# Patient Record
Sex: Male | Born: 1955
Health system: Southern US, Community
[De-identification: ages and names within clinical notes are randomized; demographics above are authoritative.]

## PROBLEM LIST (undated history)

## (undated) DIAGNOSIS — I10 Essential (primary) hypertension: Secondary | ICD-10-CM

## (undated) DIAGNOSIS — E781 Pure hyperglyceridemia: Secondary | ICD-10-CM

## (undated) DIAGNOSIS — F209 Schizophrenia, unspecified: Secondary | ICD-10-CM

## (undated) DIAGNOSIS — K219 Gastro-esophageal reflux disease without esophagitis: Secondary | ICD-10-CM

## (undated) DIAGNOSIS — J439 Emphysema, unspecified: Secondary | ICD-10-CM

## (undated) HISTORY — DX: Essential (primary) hypertension: I10

## (undated) HISTORY — DX: Schizophrenia, unspecified: F20.9

## (undated) HISTORY — PX: TONSILLECTOMY: SUR1361

## (undated) HISTORY — DX: Gastro-esophageal reflux disease without esophagitis: K21.9

## (undated) HISTORY — DX: Emphysema, unspecified: J43.9

## (undated) HISTORY — PX: WISDOM TOOTH EXTRACTION: SHX21

---

## 2001-04-03 ENCOUNTER — Encounter (INDEPENDENT_AMBULATORY_CARE_PROVIDER_SITE_OTHER): Payer: Self-pay | Admitting: Specialist

## 2001-04-03 ENCOUNTER — Other Ambulatory Visit: Admission: RE | Admit: 2001-04-03 | Discharge: 2001-04-03 | Payer: Self-pay | Admitting: Cardiology

## 2004-04-28 ENCOUNTER — Encounter (INDEPENDENT_AMBULATORY_CARE_PROVIDER_SITE_OTHER): Payer: Self-pay | Admitting: Family Medicine

## 2004-04-28 LAB — CONVERTED CEMR LAB: PSA: 0.29 ng/mL

## 2004-07-05 ENCOUNTER — Ambulatory Visit: Payer: Self-pay | Admitting: Internal Medicine

## 2004-08-04 ENCOUNTER — Ambulatory Visit: Payer: Self-pay | Admitting: *Deleted

## 2004-12-25 ENCOUNTER — Ambulatory Visit: Payer: Self-pay | Admitting: Family Medicine

## 2005-02-05 ENCOUNTER — Ambulatory Visit: Payer: Self-pay | Admitting: Family Medicine

## 2005-04-18 ENCOUNTER — Ambulatory Visit: Payer: Self-pay | Admitting: Family Medicine

## 2005-06-18 ENCOUNTER — Ambulatory Visit: Payer: Self-pay | Admitting: Family Medicine

## 2005-10-15 ENCOUNTER — Ambulatory Visit: Payer: Self-pay | Admitting: Family Medicine

## 2006-02-19 ENCOUNTER — Ambulatory Visit: Payer: Self-pay | Admitting: Family Medicine

## 2006-06-05 ENCOUNTER — Ambulatory Visit: Payer: Self-pay | Admitting: Family Medicine

## 2006-09-25 ENCOUNTER — Ambulatory Visit: Payer: Self-pay | Admitting: Family Medicine

## 2007-01-13 ENCOUNTER — Ambulatory Visit: Payer: Self-pay | Admitting: Family Medicine

## 2007-02-20 ENCOUNTER — Ambulatory Visit: Payer: Self-pay | Admitting: Family Medicine

## 2007-06-23 ENCOUNTER — Encounter (INDEPENDENT_AMBULATORY_CARE_PROVIDER_SITE_OTHER): Payer: Self-pay | Admitting: Family Medicine

## 2007-06-23 DIAGNOSIS — E785 Hyperlipidemia, unspecified: Secondary | ICD-10-CM | POA: Insufficient documentation

## 2007-06-23 DIAGNOSIS — F200 Paranoid schizophrenia: Secondary | ICD-10-CM | POA: Insufficient documentation

## 2007-06-23 DIAGNOSIS — Z87891 Personal history of nicotine dependence: Secondary | ICD-10-CM | POA: Insufficient documentation

## 2007-06-23 DIAGNOSIS — Z9089 Acquired absence of other organs: Secondary | ICD-10-CM | POA: Insufficient documentation

## 2007-06-23 DIAGNOSIS — K219 Gastro-esophageal reflux disease without esophagitis: Secondary | ICD-10-CM | POA: Insufficient documentation

## 2007-06-24 DIAGNOSIS — E781 Pure hyperglyceridemia: Secondary | ICD-10-CM | POA: Insufficient documentation

## 2008-01-23 ENCOUNTER — Ambulatory Visit: Payer: Self-pay | Admitting: Family Medicine

## 2008-01-23 DIAGNOSIS — I10 Essential (primary) hypertension: Secondary | ICD-10-CM | POA: Insufficient documentation

## 2008-01-30 ENCOUNTER — Encounter (INDEPENDENT_AMBULATORY_CARE_PROVIDER_SITE_OTHER): Payer: Self-pay | Admitting: Family Medicine

## 2008-01-30 LAB — CONVERTED CEMR LAB
ALT: 10 units/L (ref 0–53)
AST: 16 units/L (ref 0–37)
Albumin: 5 g/dL (ref 3.5–5.2)
Alkaline Phosphatase: 193 units/L — ABNORMAL HIGH (ref 39–117)
BUN: 11 mg/dL (ref 6–23)
Basophils Absolute: 0.1 10*3/uL (ref 0.0–0.1)
Basophils Relative: 1 % (ref 0–1)
CO2: 20 meq/L (ref 19–32)
Calcium: 9.8 mg/dL (ref 8.4–10.5)
Chloride: 100 meq/L (ref 96–112)
Cholesterol: 193 mg/dL (ref 0–200)
Creatinine, Ser: 0.95 mg/dL (ref 0.40–1.50)
Eosinophils Absolute: 0.5 10*3/uL (ref 0.0–0.7)
Eosinophils Relative: 8 % — ABNORMAL HIGH (ref 0–5)
Glucose, Bld: 71 mg/dL (ref 70–99)
HCT: 50.1 % (ref 39.0–52.0)
HDL: 46 mg/dL (ref >39)
Hemoglobin: 16.6 g/dL (ref 13.0–17.0)
LDL Cholesterol: 129 mg/dL — ABNORMAL HIGH (ref 0–99)
Lymphocytes Relative: 45 % (ref 12–46)
Lymphs Abs: 3.1 10*3/uL (ref 0.7–4.0)
MCHC: 33.1 g/dL (ref 30.0–36.0)
MCV: 95.6 fL (ref 78.0–100.0)
Monocytes Absolute: 0.5 10*3/uL (ref 0.1–1.0)
Monocytes Relative: 7 % (ref 3–12)
Neutro Abs: 2.8 10*3/uL (ref 1.7–7.7)
Neutrophils Relative %: 40 % — ABNORMAL LOW (ref 43–77)
Platelets: 333 10*3/uL (ref 150–400)
Potassium: 5 meq/L (ref 3.5–5.3)
RBC: 5.24 M/uL (ref 4.22–5.81)
RDW: 13.6 % (ref 11.5–15.5)
Sodium: 137 meq/L (ref 135–145)
TSH: 0.834 microintl units/mL (ref 0.350–5.50)
Total Bilirubin: 0.5 mg/dL (ref 0.3–1.2)
Total CHOL/HDL Ratio: 4.2
Total Protein: 8.1 g/dL (ref 6.0–8.3)
Triglycerides: 88 mg/dL (ref <150)
VLDL: 18 mg/dL (ref 0–40)
WBC: 7 10*3/uL (ref 4.0–10.5)

## 2008-09-29 ENCOUNTER — Telehealth (INDEPENDENT_AMBULATORY_CARE_PROVIDER_SITE_OTHER): Payer: Self-pay | Admitting: *Deleted

## 2008-12-13 ENCOUNTER — Ambulatory Visit: Payer: Self-pay | Admitting: Family Medicine

## 2008-12-13 LAB — CONVERTED CEMR LAB
ALT: <8 units/L (ref 0–53)
AST: 11 units/L (ref 0–37)
Albumin: 4.6 g/dL (ref 3.5–5.2)
Alkaline Phosphatase: 140 units/L — ABNORMAL HIGH (ref 39–117)
BUN: 13 mg/dL (ref 6–23)
Basophils Absolute: 0.1 10*3/uL (ref 0.0–0.1)
Basophils Relative: 1 % (ref 0–1)
CO2: 19 meq/L (ref 19–32)
Calcium: 9.8 mg/dL (ref 8.4–10.5)
Chloride: 105 meq/L (ref 96–112)
Cholesterol: 164 mg/dL (ref 0–200)
Creatinine, Ser: 1.29 mg/dL (ref 0.40–1.50)
Eosinophils Absolute: 0.7 10*3/uL (ref 0.0–0.7)
Eosinophils Relative: 8 % — ABNORMAL HIGH (ref 0–5)
Glucose, Bld: 91 mg/dL (ref 70–99)
HCT: 46.8 % (ref 39.0–52.0)
HDL: 40 mg/dL (ref >39)
Hemoglobin: 16.5 g/dL (ref 13.0–17.0)
LDL Cholesterol: 101 mg/dL — ABNORMAL HIGH (ref 0–99)
Lymphocytes Relative: 42 % (ref 12–46)
Lymphs Abs: 3.5 10*3/uL (ref 0.7–4.0)
MCHC: 35.3 g/dL (ref 30.0–36.0)
MCV: 93 fL (ref 78.0–100.0)
Monocytes Absolute: 0.7 10*3/uL (ref 0.1–1.0)
Monocytes Relative: 8 % (ref 3–12)
Neutro Abs: 3.5 10*3/uL (ref 1.7–7.7)
Neutrophils Relative %: 42 % — ABNORMAL LOW (ref 43–77)
PSA: 0.25 ng/mL (ref 0.10–4.00)
Platelets: 308 10*3/uL (ref 150–400)
Potassium: 4.5 meq/L (ref 3.5–5.3)
RBC: 5.03 M/uL (ref 4.22–5.81)
RDW: 13.2 % (ref 11.5–15.5)
Sodium: 139 meq/L (ref 135–145)
TSH: 1.094 microintl units/mL (ref 0.350–4.50)
Total Bilirubin: 0.5 mg/dL (ref 0.3–1.2)
Total CHOL/HDL Ratio: 4.1
Total Protein: 7.6 g/dL (ref 6.0–8.3)
Triglycerides: 116 mg/dL (ref <150)
VLDL: 23 mg/dL (ref 0–40)
WBC: 8.4 10*3/uL (ref 4.0–10.5)

## 2008-12-15 ENCOUNTER — Encounter (INDEPENDENT_AMBULATORY_CARE_PROVIDER_SITE_OTHER): Payer: Self-pay | Admitting: Family Medicine

## 2009-01-07 ENCOUNTER — Encounter (INDEPENDENT_AMBULATORY_CARE_PROVIDER_SITE_OTHER): Payer: Self-pay | Admitting: Family Medicine

## 2009-01-11 ENCOUNTER — Ambulatory Visit: Payer: Self-pay | Admitting: Family Medicine

## 2009-01-11 LAB — CONVERTED CEMR LAB
Bilirubin Urine: NEGATIVE
Glucose, Urine, Semiquant: NEGATIVE
Ketones, urine, test strip: NEGATIVE
Nitrite: NEGATIVE
OCCULT 1: NEGATIVE
OCCULT 2: NEGATIVE
OCCULT 3: NEGATIVE
Protein, U semiquant: NEGATIVE
Specific Gravity, Urine: <1.005
Urobilinogen, UA: 0.2
WBC Urine, dipstick: NEGATIVE
pH: 6

## 2009-01-17 ENCOUNTER — Encounter (INDEPENDENT_AMBULATORY_CARE_PROVIDER_SITE_OTHER): Payer: Self-pay | Admitting: Family Medicine

## 2009-01-27 ENCOUNTER — Encounter (INDEPENDENT_AMBULATORY_CARE_PROVIDER_SITE_OTHER): Payer: Self-pay | Admitting: Family Medicine

## 2009-02-02 ENCOUNTER — Encounter (INDEPENDENT_AMBULATORY_CARE_PROVIDER_SITE_OTHER): Payer: Self-pay | Admitting: Family Medicine

## 2009-02-04 ENCOUNTER — Encounter (INDEPENDENT_AMBULATORY_CARE_PROVIDER_SITE_OTHER): Payer: Self-pay | Admitting: Family Medicine

## 2009-11-09 ENCOUNTER — Ambulatory Visit: Payer: Self-pay | Admitting: Physician Assistant

## 2009-11-09 LAB — CONVERTED CEMR LAB
ALT: 9 units/L (ref 0–53)
AST: 11 units/L (ref 0–37)
Albumin: 4.8 g/dL (ref 3.5–5.2)
Alkaline Phosphatase: 115 units/L (ref 39–117)
BUN: 23 mg/dL (ref 6–23)
CO2: 26 meq/L (ref 19–32)
Calcium: 9.7 mg/dL (ref 8.4–10.5)
Chloride: 102 meq/L (ref 96–112)
Cholesterol, target level: 200 mg/dL
Creatinine, Ser: 1.3 mg/dL (ref 0.40–1.50)
Glucose, Bld: 83 mg/dL (ref 70–99)
HDL goal, serum: 40 mg/dL
LDL Goal: 100 mg/dL
Potassium: 5.1 meq/L (ref 3.5–5.3)
Sodium: 140 meq/L (ref 135–145)
Total Bilirubin: 0.4 mg/dL (ref 0.3–1.2)
Total Protein: 7.5 g/dL (ref 6.0–8.3)

## 2009-11-11 ENCOUNTER — Encounter: Payer: Self-pay | Admitting: Physician Assistant

## 2010-03-31 ENCOUNTER — Telehealth: Payer: Self-pay | Admitting: Physician Assistant

## 2010-11-30 NOTE — Letter (Signed)
Summary: REFERRAL/GASTROENTEROLOGY/APPT DATE & TIME  REFERRAL/GASTROENTEROLOGY/APPT DATE & TIME   Imported By: Arta Bruce 01/07/2009 09:46:01  _____________________________________________________________________  External Attachment:    Type:   Image     Comment:   External Document

## 2010-11-30 NOTE — Miscellaneous (Signed)
  Clinical Lists Changes  Observations: Added new observation of COLONOSCOPY: sessile polyphemhorroids/recheck 2013 dr.Hung (01/27/2009 14:14)

## 2010-11-30 NOTE — Assessment & Plan Note (Signed)
Summary: renew medications//gk   Vital Signs:  Patient profile:   55 year old male Height:      72 inches Weight:      203 pounds BMI:     27.63 Temp:     97.6 degrees F oral Pulse rate:   76 / minute Pulse rhythm:   regular Resp:     18 per minute BP sitting:   145 / 90  (left arm) Cuff size:   regular  Vitals Entered By: Armenia Shannon (November 09, 2009 2:24 PM) CC: refill on meds, Hypertension Management, Lipid Management Is Patient Diabetic? No Pain Assessment Patient in pain? no       Does patient need assistance? Functional Status Self care Ambulation Normal   CC:  refill on meds, Hypertension Management, and Lipid Management.  History of Present Illness: First meeting with patient.  Previously followed by Dr. Barbaraann Barthel. Not taking BP med.  Has been controlled with diet and exercise in the past. Still goes to [facility] for [facility].  Gets haldol injection from them.  Goes for visit q 5 weeks. Still taking lopid 600 mg two times a day.   Hypertension History:      He denies chest pain, dyspnea with exertion, orthopnea, peripheral edema, and syncope.  Further comments include: previously treated with diet .        Positive major cardiovascular risk factors include male age 37 years old or older, hyperlipidemia, hypertension, and current tobacco user.  Negative major cardiovascular risk factors include no history of diabetes and negative family history for ischemic heart disease.        Further assessment for target organ damage reveals no history of ASHD, stroke/TIA, or peripheral vascular disease.    Lipid Management History:      Positive NCEP/ATP III risk factors include male age 65 years old or older, current tobacco user, and hypertension.  Negative NCEP/ATP III risk factors include non-diabetic, no family history for ischemic heart disease, no ASHD (atherosclerotic heart disease), no prior stroke/TIA, no peripheral vascular disease, and no history of aortic  aneurysm.      Current Medications (verified): 1)  Famotidine 40 Mg  Tabs (Famotidine) .... Take 1 Tablet By Mouth Bid 2)  Aspirin Ec Low Dose 81 Mg  Tbec (Aspirin) .... Take 1 Tablet By Mouth Daily 3)  Gemfibrozil 600 Mg  Tabs (Gemfibrozil) .... Take 1 Tablet By Mouth Two Times A Day 4)  Haldol .... Unknown Im Dose Each Month(Dr.bartlett)  Allergies (verified): 1)  Penicillin V Potassium (Penicillin V Potassium)  Past History:  Past Medical History: Current Problems:  HYPERTRIGLYCERIDEMIA (ICD-272.1) TONSILLECTOMY, HX OF (ICD-V45.79) HX, PERSONAL, TOBACCO USE (ICD-V15.82) GERD (ICD-530.81) HYPERLIPIDEMIA (ICD-272.4) SCHIZOPHRENIA, PARANOID (ICD-295.30) Colonoscopy in 01/2009:  polyp removed; needs repeat 2015  Review of Systems  The patient denies fever, hemoptysis, melena, hematochezia, and severe indigestion/heartburn.    Physical Exam  General:  alert, well-developed, and well-nourished.   Head:  normocephalic and atraumatic.   Neck:  supple and no carotid bruits.   Lungs:  normal breath sounds and no crackles.   mild exp wheezes notes at bases Heart:  normal rate and regular rhythm.   Abdomen:  soft, non-tender, and no hepatomegaly.   Neurologic:  alert & oriented X3 and cranial nerves II-XII intact.   Psych:  normally interactive.     Impression & Recommendations:  Problem # 1:  HX, PERSONAL, TOBACCO USE (ICD-V15.82) patient wants chantix but, with psych dx, advise against this will give nicodine  patch  Problem # 2:  ESSENTIAL HYPERTENSION, BENIGN (ICD-401.1) d/c cigs watch diet  monitor  Problem # 3:  HYPERTRIGLYCERIDEMIA (ICD-272.1)  check labs not fasting get lipids at CPE  His updated medication list for this problem includes:    Gemfibrozil 600 Mg Tabs (Gemfibrozil) .Marland Kitchen... Take 1 tablet by mouth two times a day  Orders: T-Comprehensive Metabolic Panel ([id number])  Problem # 4:  GERD (ICD-530.81) controlled on famotidine  His updated  medication list for this problem includes:    Famotidine 40 Mg Tabs (Famotidine) .Marland Kitchen... Take 1 tablet by mouth bid  Problem # 5:  SCHIZOPHRENIA, PARANOID (ICD-295.30) cont f/u with [facility]  Complete Medication List: 1)  Famotidine 40 Mg Tabs (Famotidine) .... Take 1 tablet by mouth bid 2)  Aspirin Ec Low Dose 81 Mg Tbec (Aspirin) .... Take 1 tablet by mouth daily 3)  Gemfibrozil 600 Mg Tabs (Gemfibrozil) .... Take 1 tablet by mouth two times a day 4)  Haldol  .... Unknown im dose each month(dr.bartlett) 5)  Nicoderm Cq 21 Mg/24hr Pt24 (Nicotine) .... Apply once daily for 4 weeks 6)  Nicoderm Cq 14 Mg/24hr Pt24 (Nicotine) .... Apply once daily (start after you run out of the 21 mg patch) for 2 weeks 7)  Nicoderm Cq 7 Mg/24hr Pt24 (Nicotine) .... Apply once daily (do not start until you run out of the 14 mg patch) for 2 weeks then stop  Hypertension Assessment/Plan:      The patient's hypertensive risk group is category B: At least one risk factor (excluding diabetes) with no target organ damage.  His calculated 10 year risk of coronary heart disease is 22 %.  Today's blood pressure is 145/90.  His blood pressure goal is < 140/90.  Lipid Assessment/Plan:      Based on NCEP/ATP III, the patient's risk factor category is "0-1 risk factors".  The patient's lipid goals are as follows: Total cholesterol goal is 200; LDL cholesterol goal is 100; HDL cholesterol goal is 40; Triglyceride goal is 150.    Patient Instructions: 1)  Stop smoking tips: Choose a quit date. Cut down before the quit date. Decide what you will do as a substitute when you feel the urge to smoke(gum, toothpick, exercise).  2)  Once you quit, you can start the nicotine patch. 3)  Wear the 21 mg patch for 4 weeks, then the 14 mg patch for 2 weeks then the 7 mg patch for 2 weeks then stop. 4)  The prescription for the nicotine patch has been sent to the [facility] pharmacy at [facility]. 5)  Please schedule a follow-up appointment in  2 months with [facility] for CPE.  Come fasting for blood work (nothing to eat or drink except water after midnight). 6)    Prescriptions: NICODERM CQ 7 MG/24HR PT24 (NICOTINE) apply once daily (do not start until you run out of the 14 mg patch) for 2 weeks then stop  #2 wk supply x 0   Entered and Authorized by:   Tereso Newcomer PA-C   Signed by:   Tereso Newcomer PA-C on 11/09/2009   Method used:   Print then Give to Patient   RxID:   [id number] NICODERM CQ 14 MG/24HR PT24 (NICOTINE) apply once daily (start after you run out of the 21 mg patch) for 2 weeks  #2 wk supply x 0   Entered and Authorized by:   Tereso Newcomer PA-C   Signed by:   Tereso Newcomer PA-C on 11/09/2009   Method  used:   Print then Give to Patient   RxID:   [id number] NICODERM CQ 21 MG/24HR PT24 (NICOTINE) apply once daily for 4 weeks  #1 mo supply x 0   Entered and Authorized by:   Tereso Newcomer PA-C   Signed by:   Tereso Newcomer PA-C on 11/09/2009   Method used:   Print then Give to Patient   RxID:   [id number]

## 2010-11-30 NOTE — Letter (Signed)
Summary: COLONOSCOPY  COLONOSCOPY   Imported By: Arta Bruce 02/10/2009 10:28:52  _____________________________________________________________________  External Attachment:    Type:   Image     Comment:   External Document

## 2010-11-30 NOTE — Letter (Signed)
Summary: *HSN Results Follow up  [facility]  [address] [place], [place] [address]   Phone: [phone]  Fax: [phone]      12/15/2008   Travis Lawrence Feinberg [address] [place], [place]  [address]   Dear  Mr. Travis Lawrence,                            ____S.Drinkard,FNP   ____D. Gore,FNP       ____B. McPherson,MD   __X__V. Riyan Haile,MD    ____E. Mulberry,MD    ____N. Daphine Deutscher, FNP  ____D. Reche Dixon, MD    ____K. Philipp Deputy, MD    ____Other     This letter is to inform you that your recent test(s):  _______Pap Smear    ___X____Lab Test     _______X-ray    ____X___ is within acceptable limits  _______ requires a medication change  _______ requires a follow-up lab visit  _______ requires a follow-up visit with your provider   Comments: *** Your labs were fine. Please stay on your current medications****       _________________________________________________________ If you have any questions, please contact our office                     Sincerely,  Beverley Fiedler MD [facility]

## 2010-11-30 NOTE — Letter (Signed)
Summary: *HSN Results Follow up  [facility]  [address] [place], [place] [address]   Phone: [phone]  Fax: [phone]      01/30/2008   MALACKI MCPHEARSON Bernardini [address] [place], [place]  [address]   Dear  Mr. Gabino Stlaurent,                            ____S.Drinkard,FNP   ____D. Gore,FNP       ____B. McPherson,MD   _X_V. Virjean Boman,MD    ____E. Mulberry,MD    ____N. Daphine Deutscher, FNP  ____D. Reche Dixon, MD    ____K. Philipp Deputy, MD    ____Other     This letter is to inform you that your recent test(s):  _______Pap Smear    _______Lab Test     _______X-ray    _______ is within acceptable limits  _______ requires a medication change  _______ requires a follow-up lab visit  __X____ requires a follow-up visit with your provider   Comments: Your recent labs looked good except that your LDL(bad cholesterol) was slightly elevated at 129 (it had been 94 the last time your cholesterol panel was checked). Ideally, LDL should be below 120(even <100). Your triglycerides were great and your HDL(good cholesterol) had even improved. Over-all labs looked good. You are due for a full physical and need to call the office and schedule one in the next 2 months(we fill up slots fast so call early). Please bring all of your medicine bottles with you to your office visit. See if you can lose 10 lbs in the next few months as this may be why your LDL has increased slightly(your best weight was 194 lbs in 11/07 and also your best LDL cholesterol).       _________________________________________________________ If you have any questions, please contact our office                     Sincerely,  Beverley Fiedler MD [facility]

## 2010-11-30 NOTE — Letter (Signed)
Summary: *HSN Results Follow up  [facility]  [address] [place], [place] [address]   Phone: [phone]  Fax: [phone]      11/11/2009   Travis Lawrence Winkels [address] [place], [place]  [address]   Dear  [name],                            ____S.Drinkard,FNP   ____D. Gore,FNP       ____B. McPherson,MD   ____V. Rankins,MD    ____E. Mulberry,MD    ____N. Daphine Deutscher, FNP  ____D. Reche Dixon, MD    ____K. Philipp Deputy, MD    __x__S. Alben Spittle, PA-C     This letter is to inform you that your recent test(s):  _______Pap Smear    ___x____Lab Test     _______X-ray    ___x____ is within acceptable limits  _______ requires a medication change  _______ requires a follow-up lab visit  _______ requires a follow-up visit with your provider   Comments:       _________________________________________________________ If you have any questions, please contact our office                     Sincerely,  Tereso Newcomer PA-C [facility]

## 2010-11-30 NOTE — Progress Notes (Signed)
Summary: Needs CPE/second request  Phone Note Outgoing Call   Summary of Call: CMA to call: As per my letter of 4/09,you are overdue for CPE. You are also due for fasting labs. I refilled your gemfibrozil for 2 more months but you must be seen for CPE and fasting labs for refills beyond the ones done today. Initial call taken by: Beverley Fiedler MD,  September 29, 2008 5:26 PM  Follow-up for Phone Call        pt notified that he must see Dr Barbaraann Barthel within the next two months or he will not recieve any further refills. Follow-up by: Mikey College CMA,  September 30, 2008 9:12 AM

## 2010-11-30 NOTE — Progress Notes (Signed)
  Phone Note Refill Request Call back at Home Phone [phone] Call back at [phone] Message from:  Patient on March 31, 2010 12:55 PM  Refills Requested: Medication #1:  FAMOTIDINE 40 MG  TABS take 1 tablet by mouth bid   Last Refilled: 05/27/2009  Medication #2:  GEMFIBROZIL 600 MG  TABS take 1 tablet by mouth two times a day   Last Refilled: 05/27/2009  Method Requested: Fax to Local Pharmacy Initial call taken by: Armenia Shannon,  March 31, 2010 12:55 PM Summary of Call: The pt is requesting for more refills from famotidine and gemfibrozil from [facility]) Darneshia Demary PA-c Initial call taken by: Manon Hilding,  March 31, 2010 11:20 AM  Follow-up for Phone Call        pt is aware Follow-up by: Armenia Shannon,  April 03, 2010 12:07 PM    Prescriptions: GEMFIBROZIL 600 MG  TABS (GEMFIBROZIL) take 1 tablet by mouth two times a day  #60 Tablet x 2   Entered by:   Julieanne Manson MD   Authorized by:   Tereso Newcomer PA-C   Signed by:   Julieanne Manson MD on 03/31/2010   Method used:   [facility]ically to        [facility] [phone][facility] (retail)       [address], [place]  [address]       Ph: [phone] or [phone]       Fax: [phone]   RxID:   [id number] FAMOTIDINE 40 MG  TABS (FAMOTIDINE) take 1 tablet by mouth bid  #60 Tablet x 2   Entered by:   Julieanne Manson MD   Authorized by:   Tereso Newcomer PA-C   Signed by:   Julieanne Manson MD on 03/31/2010   Method used:   [facility]        [facility] [phone]* (retail)       [address]       [place], [place]  [address]       Ph: [phone] or [phone]       Fax: [phone]   RxID:   [id number]  Please schedule him with Lorin Picket for a fasting CPE--was to have that in april   Agree. Tereso Newcomer PA-C  April 02, 2010 2:12 PM

## 2010-11-30 NOTE — Letter (Signed)
Summary: REQUESTED RECORDS MAILED TO THE [facility]  REQUESTED RECORDS MAILED TO THE [facility]   Imported By: Arta Bruce 02/02/2009 10:40:09  _____________________________________________________________________  External Attachment:    Type:   Image     Comment:   External Document

## 2010-11-30 NOTE — Assessment & Plan Note (Signed)
Summary: 1 month fu for cpe//////kt   Vital Signs:  Patient profile:   55 year old male Height:      72 inches Weight:      200 pounds BMI:     27.22 BSA:     2.13 Temp:     97.1 degrees F oral Pulse rate:   72 / minute Pulse rhythm:   regular Resp:     20 per minute BP sitting:   120 / 86  (left arm) Cuff size:   regular  Vitals Entered By: Armenia Shannon (January 11, 2009 3:36 PM) CC: pt would like to know if he can have anything so he can stop smoking.... pt stated he was using Chantix... Is Patient Diabetic? No Pain Assessment Patient in pain? no        History of Present Illness: Here for CPE. No acute issues and recent labs reviewed and good. Reports compliance with meds.  Allergies: 1)  Penicillin V Potassium (Penicillin V Potassium)  Past History:  Past Medical History:    Reviewed history from 06/23/2007 and no changes required:    Current Problems:     HYPERTRIGLYCERIDEMIA (ICD-272.1)    TONSILLECTOMY, HX OF (ICD-V45.79)    HX, PERSONAL, TOBACCO USE (ICD-V15.82)    GERD (ICD-530.81)    HYPERLIPIDEMIA (ICD-272.4)    SCHIZOPHRENIA, PARANOID (ICD-295.30)  Past Surgical History:    Reviewed history from 06/23/2007 and no changes required:    Tonsillectomy  Social History:    Reviewed history from 12/13/2008 and no changes required:       Occupation:Disability...previously worked as a Counsellor.       Single       Current Smoker       Alcohol use-yes       Drug use-no  Review of Systems  The patient denies anorexia, fever, vision loss, chest pain, syncope, dyspnea on exertion, peripheral edema, prolonged cough, hemoptysis, abdominal pain, melena, and hematochezia.         Has worked on diet to get down to 198-200 lbs.  Physical Exam  General:  Well-developed,well-nourished,in no acute distress; alert,appropriate and cooperative throughout examination Head:  Normocephalic and atraumatic without obvious abnormalities. No apparent alopecia or  balding. Eyes:  pupils equal, pupils round, pupils reactive to light, and no injection.   Ears:  External ear exam shows no significant lesions or deformities.  Otoscopic examination reveals clear canals, tympanic membranes are intact bilaterally without bulging, retraction, inflammation or discharge. Hearing is grossly normal bilaterally. Nose:  External nasal examination shows no deformity or inflammation. Nasal mucosa are pink and moist without lesions or exudates. Mouth:  Oral mucosa and oropharynx without lesions or exudates.  Teeth in good repair.pharynx pink and moist, no erythema, and no exudates.   Neck:  supple, no thyromegaly, no carotid bruits, and no cervical lymphadenopathy.   Chest Wall:  No deformities, masses, tenderness or gynecomastia noted. Lungs:  Normal respiratory effort, chest expands symmetrically. Lungs are clear to auscultation, no crackles or wheezes. Heart:  Normal rate and regular rhythm. S1 and S2 normal without gallop, murmur, click, rub or other extra sounds. Abdomen:  Bowel sounds positive,abdomen soft and non-tender without masses, organomegaly or hernias noted. Rectal:  No external abnormalities noted. Normal sphincter tone. No rectal masses or tenderness. Guiac neg/control positive. Genitalia:  Testes bilaterally descended without nodularity, tenderness or masses. No scrotal masses or lesions. No penis lesions or urethral discharge.circumcised.   Prostate:  Prostate gland firm and smooth, no enlargement, nodularity, tenderness,  mass, asymmetry or induration. Msk:  No deformity or scoliosis noted of thoracic or lumbar spine.   Pulses:  R dorsalis pedis normal and L dorsalis pedis normal.   Extremities:  No CCE Neurologic:  alert & oriented X3 and cranial nerves II-XII intact grossly.   Skin:  Intact without suspicious lesions or rashes Cervical Nodes:  No lymphadenopathy noted Axillary Nodes:  No palpable lymphadenopathy Psych:  Oriented X3, memory intact for  recent and remote, normally interactive, good eye contact, not anxious appearing, and not depressed appearing.     Impression & Recommendations:  Problem # 1:  EXAMINATION, ROUTINE MEDICAL (ICD-V70.0) Td UTD 05/2006 Has appt. for GI on 3/27 for colonoscopy.  Orders: Hemoccult Guaiac-1 spec.(in office) (95284) Hemoccult Cards -3 specimans (take home) (13244) UA Dipstick w/o Micro (automated)  ([id number])  Problem # 2:  ESSENTIAL HYPERTENSION, BENIGN (ICD-401.1) Controlled with lifestyle changes and weight loss. desires to quit smoking.  Problem # 3:  HYPERTRIGLYCERIDEMIA (ICD-272.1)  His updated medication list for this problem includes:    Gemfibrozil 600 Mg Tabs (Gemfibrozil) .Marland Kitchen... Take 1 tablet by mouth two times a day  Problem # 4:  SCHIZOPHRENIA, PARANOID (ICD-295.30) Gets [facility] prolixin at [facility].  Complete Medication List: 1)  Famotidine 40 Mg Tabs (Famotidine) .... Take 1 tablet by mouth bid 2)  Aspirin Ec Low Dose 81 Mg Tbec (Aspirin) .... Take 1 tablet by mouth daily 3)  Gemfibrozil 600 Mg Tabs (Gemfibrozil) .... Take 1 tablet by mouth two times a day 4)  Haldol  .... Unknown im dose each month(dr.bartlett)   Laboratory Results   Urine Tests  Date/Time Received: January 11, 2009 3:46 PM   Routine Urinalysis   Color: lt. yellow Glucose: negative   (Normal Range: Negative) Bilirubin: negative   (Normal Range: Negative) Ketone: negative   (Normal Range: Negative) Spec. Gravity: <1.005   (Normal Range: 1.003-1.035) Blood: trace-lysed   (Normal Range: Negative) pH: 6.0   (Normal Range: 5.0-8.0) Protein: negative   (Normal Range: Negative) Urobilinogen: 0.2   (Normal Range: 0-1) Nitrite: negative   (Normal Range: Negative) Leukocyte Esterace: negative   (Normal Range: Negative)    Date/Time Received: January 11, 2009 3:46 PM   Stool - Occult Blood Hemmoccult #1: negative Date: 01/11/2009 Hemoccult #2: negative Date: 01/11/2009 Hemoccult #3: negative Date:  01/11/2009     Tetanus/Td Immunization History:    Tetanus/Td # 1:  Historical (06/05/2006)  Influenza Immunization History:    Influenza # 1:  community (08/29/2008)

## 2010-11-30 NOTE — Letter (Signed)
Summary: DR.HUNG/GI EVAL  DR.HUNG/GI EVAL   Imported By: Arta Bruce 02/01/2009 11:33:33  _____________________________________________________________________  External Attachment:    Type:   Image     Comment:   External Document

## 2010-11-30 NOTE — Assessment & Plan Note (Signed)
Summary: LST SEEN IN 08/RENEW MEDS//[place]   Vital Signs:  Patient Profile:   55 Years Old Male Weight:      201 pounds Temp:     97.2 degrees F Pulse rate:   80 / minute Pulse rhythm:   regular Resp:     18 per minute BP sitting:   136 / 80  (left arm) Cuff size:   regular  Pt. in pain?   no  Vitals Entered By: Vesta Mixer CMA (December 13, 2008 3:20 PM)              Is Patient Diabetic? No  Does patient need assistance? Ambulation Normal     Chief Complaint:  Not seen in a while needs refill on omeprazole.Marland Kitchen  History of Present Illness: Here after multiple requests for office f/u and fasting labs. Here alone. He says he last ate a large breakfast at 8am this morning. No food since that time. Taking all meds. He says he had quit smoking for 6 months but then restarted. Reports compliance with meds.  Still sees Dr[facility]Bartlett at [facility]. Last saw psychiatrist for monthly shot of Haldol on 12/02/2008. has no acute issues.Here as wants refills on his pepcid. Says he has indigestion when not take the pepcid and must use TUMS.    Prior Medications Reviewed Using: Medication Bottles  Updated Prior Medication List: out of famotidine and did not get chantix Current Allergies (reviewed today): PENICILLIN V POTASSIUM (PENICILLIN V POTASSIUM)  Past Medical History:    Reviewed history from 06/23/2007 and no changes required:       Current Problems:        HYPERTRIGLYCERIDEMIA (ICD-272.1)       TONSILLECTOMY, HX OF (ICD-V45.79)       HX, PERSONAL, TOBACCO USE (ICD-V15.82)       GERD (ICD-530.81)       HYPERLIPIDEMIA (ICD-272.4)       SCHIZOPHRENIA, PARANOID (ICD-295.30)         Past Surgical History:    Reviewed history from 06/23/2007 and no changes required:       Tonsillectomy   Social History:    Occupation:Disability...previously worked as a Counsellor.    Single    Current Smoker    Alcohol use-yes    Drug use-no   Risk Factors:  Tobacco use:  current Drug use:   no Alcohol use:  yes   Review of Systems  The patient denies fever, chest pain, syncope, prolonged cough, hemoptysis, abdominal pain, melena, and hematochezia.         occasionally gets a SOB after a long walk...1 mile.   Physical Exam  General:     Well-developed,well-nourished,in no acute distress; alert,appropriate and cooperative throughout examination Lungs:     Normal respiratory effort, chest expands symmetrically. Lungs are clear to auscultation, no crackles or wheezes. Heart:     Normal rate and regular rhythm. S1 and S2 normal without gallop, murmur, click, rub or other extra sounds. Abdomen:     Bowel sounds positive,abdomen soft and non-tender without masses, organomegaly. Psych:     Oriented X3, good eye contact, not anxious appearing, and not depressed appearing. Minimal spontaneous speech but responds to questions appropriately and has read about screening colonoscopies and would like one.     Impression & Recommendations:  Problem # 1:  HYPERTRIGLYCERIDEMIA (ICD-272.1)  His updated medication list for this problem includes:    Gemfibrozil 600 Mg Tabs (Gemfibrozil) .Marland Kitchen... Take 1 tablet by mouth two times a day  Orders: T-General Health Panel (CBCD, CMP, TSH) ([id number]) T-Lipid Profile ([address]81-19147)   Problem # 2:  GERD (ICD-530.81)  His updated medication list for this problem includes:    Famotidine 40 Mg Tabs (Famotidine) .Marland Kitchen... Take 1 tablet by mouth bid   Problem # 3:  SCHIZOPHRENIA, PARANOID (ICD-295.30) Appears to be stable on monthly Haldol injections.  Problem # 4:  Preventive Health Care (ICD-V70.0) Pt states he had both the seasonal and H1N1 FLU shots in the community. Risks/benefits of PSA screening discussed and patient would like the PSA blood test. Due for screening colonoscopy. MD addressed with patient that he needs to return for CPE in 1 month. Recommend that he try again to quit smoking.  Complete Medication List: 1)  Famotidine  40 Mg Tabs (Famotidine) .... Take 1 tablet by mouth bid 2)  Aspirin Ec Low Dose 81 Mg Tbec (Aspirin) .... Take 1 tablet by mouth daily 3)  Gemfibrozil 600 Mg Tabs (Gemfibrozil) .... Take 1 tablet by mouth two times a day 4)  Haldol  .... Unknown im dose each month(dr.bartlett)  Other Orders: Gastroenterology Referral (GI) T-PSA  ([id number])   Patient Instructions: 1)  See Dr.Hedi Barkan for CPE in 1 month.Bring all your medication bottles 2)  If you haven't received your appointment for the colonoscopy doctor within the next 3 weeks then call our [facility] office..   Prescriptions: GEMFIBROZIL 600 MG  TABS (GEMFIBROZIL) take 1 tablet by mouth two times a day  #60 Tablet x 3   Entered and Authorized by:   Beverley Fiedler MD   Signed by:   Beverley Fiedler MD on 12/13/2008   Method used:   Electronically to        [facility] [phone]* (retail)       [address], [place]  [address]       Ph: [phone] or [phone]       Fax: [phone]   RxID:   [id number] FAMOTIDINE 40 MG  TABS (FAMOTIDINE) take 1 tablet by mouth bid  #60 Tablet x 5   Entered and Authorized by:   Beverley Fiedler MD   Signed by:   Beverley Fiedler MD on 12/13/2008   Method used:   [facility]        [facility] [place] [phone]* (retail)       [address], [place]  [address]       Ph: [phone] or [phone]       Fax: [phone]   RxID:   [id number]    Influenza Immunization History:    Influenza # 1:  community (08/29/2008) Vaccine Type: H1N1 vaccine G code

## 2013-02-27 ENCOUNTER — Ambulatory Visit (INDEPENDENT_AMBULATORY_CARE_PROVIDER_SITE_OTHER): Payer: Medicare Other | Admitting: Family Medicine

## 2013-02-27 VITALS — BP 119/82 | HR 86 | Temp 97.8°F | Resp 18 | Ht 72.0 in | Wt 182.0 lb

## 2013-02-27 DIAGNOSIS — Z139 Encounter for screening, unspecified: Secondary | ICD-10-CM

## 2013-02-27 DIAGNOSIS — Z87891 Personal history of nicotine dependence: Secondary | ICD-10-CM

## 2013-02-27 DIAGNOSIS — J309 Allergic rhinitis, unspecified: Secondary | ICD-10-CM

## 2013-02-27 DIAGNOSIS — K219 Gastro-esophageal reflux disease without esophagitis: Secondary | ICD-10-CM

## 2013-02-27 DIAGNOSIS — E785 Hyperlipidemia, unspecified: Secondary | ICD-10-CM

## 2013-02-27 DIAGNOSIS — I10 Essential (primary) hypertension: Secondary | ICD-10-CM

## 2013-02-27 DIAGNOSIS — N400 Enlarged prostate without lower urinary tract symptoms: Secondary | ICD-10-CM

## 2013-02-27 DIAGNOSIS — Z0000 Encounter for general adult medical examination without abnormal findings: Secondary | ICD-10-CM

## 2013-02-27 DIAGNOSIS — F200 Paranoid schizophrenia: Secondary | ICD-10-CM

## 2013-02-27 LAB — POCT CBC
Granulocyte percent: 70.8 %G (ref 37–80)
HCT, POC: 52.3 % (ref 43.5–53.7)
Hemoglobin: 16.9 g/dL (ref 14.1–18.1)
Lymph, poc: 3.2 (ref 0.6–3.4)
MCH, POC: 31.7 pg — AB (ref 27–31.2)
MCHC: 32.3 g/dL (ref 31.8–35.4)
MCV: 98.1 fL — AB (ref 80–97)
MID (cbc): 0.8 (ref 0–0.9)
MPV: 7.3 fL (ref 0–99.8)
POC Granulocyte: 9.7 — AB (ref 2–6.9)
POC LYMPH PERCENT: 23.4 %L (ref 10–50)
POC MID %: 5.8 %M (ref 0–12)
Platelet Count, POC: 401 10*3/uL (ref 142–424)
RBC: 5.33 M/uL (ref 4.69–6.13)
RDW, POC: 14.3 %
WBC: 13.7 10*3/uL — AB (ref 4.6–10.2)

## 2013-02-27 LAB — LIPID PANEL
Cholesterol: 254 mg/dL — ABNORMAL HIGH (ref 0–200)
HDL: 34 mg/dL — ABNORMAL LOW (ref >39)
LDL Cholesterol: 143 mg/dL — ABNORMAL HIGH (ref 0–99)
Total CHOL/HDL Ratio: 7.5 Ratio
Triglycerides: 384 mg/dL — ABNORMAL HIGH (ref <150)
VLDL: 77 mg/dL — ABNORMAL HIGH (ref 0–40)

## 2013-02-27 LAB — POCT URINALYSIS DIPSTICK
Bilirubin, UA: NEGATIVE
Glucose, UA: NEGATIVE
Ketones, UA: NEGATIVE
Leukocytes, UA: NEGATIVE
Nitrite, UA: NEGATIVE
Protein, UA: NEGATIVE
Spec Grav, UA: <=1.005
Urobilinogen, UA: 0.2
pH, UA: 6.5

## 2013-02-27 LAB — COMPREHENSIVE METABOLIC PANEL
ALT: 12 U/L (ref 0–53)
AST: 14 U/L (ref 0–37)
Albumin: 4.4 g/dL (ref 3.5–5.2)
Alkaline Phosphatase: 117 U/L (ref 39–117)
BUN: 14 mg/dL (ref 6–23)
CO2: 25 mEq/L (ref 19–32)
Calcium: 9.8 mg/dL (ref 8.4–10.5)
Chloride: 103 mEq/L (ref 96–112)
Creat: 1.27 mg/dL (ref 0.50–1.35)
Glucose, Bld: 105 mg/dL — ABNORMAL HIGH (ref 70–99)
Potassium: 4.1 mEq/L (ref 3.5–5.3)
Sodium: 138 mEq/L (ref 135–145)
Total Bilirubin: 0.5 mg/dL (ref 0.3–1.2)
Total Protein: 7.8 g/dL (ref 6.0–8.3)

## 2013-02-27 LAB — PSA: PSA: 0.32 ng/mL (ref <=4.00)

## 2013-02-27 LAB — POCT UA - MICROSCOPIC ONLY
Bacteria, U Microscopic: NEGATIVE
Casts, Ur, LPF, POC: NEGATIVE
Crystals, Ur, HPF, POC: NEGATIVE
Mucus, UA: NEGATIVE
WBC, Ur, HPF, POC: NEGATIVE
Yeast, UA: NEGATIVE

## 2013-02-27 LAB — IFOBT (OCCULT BLOOD): IFOBT: NEGATIVE

## 2013-02-27 MED ORDER — LISINOPRIL 10 MG PO TABS: 10.0000 mg | ORAL_TABLET | Freq: Every day | ORAL | Status: DC

## 2013-02-27 MED ORDER — GEMFIBROZIL 600 MG PO TABS
600.0000 mg | ORAL_TABLET | Freq: Two times a day (BID) | ORAL | Status: DC
Start: 2013-02-27 — End: 2014-03-15

## 2013-02-27 MED ORDER — FAMOTIDINE 20 MG PO TABS: 20.0000 mg | ORAL_TABLET | Freq: Two times a day (BID) | ORAL | Status: DC

## 2013-02-27 MED ORDER — FLUTICASONE PROPIONATE 50 MCG/ACT NA SUSP: 2.0000 | Freq: Every day | NASAL | Status: DC

## 2013-02-27 NOTE — Progress Notes (Signed)
Annual physical exam:  History  Patient is here for a physical exam. He needs his medicines refilled. He is run out of several of them. He gets his haldol from the mental health clinic. He was raised changed doctors, so he came over here.  Past medical history: Medication allergies: Penicillin Current medications: Famotidine 40 mg twice a day, gemfibrozil 600 mg twice a day, lisinopril 1 daily, aspirin 81 mg, Haldol every 5 weeks Past medical illnesses: GERD, peptic ulcer disease, hypertension, hyperlipidemia, paranoid psychosis Past surgical history: Tonsillectomy Colonoscopy 5 years ago  Family history: Mother and father both deceased. He has a living brother and sister.  Social history: He is not married. Has one regular sexual partner. Uses condoms. He smokes 5 little cigars a day. He plans to quit them. He does not drink or use drugs. He is out of work, is on SSI. He is single. He has 2 years of college education. He walks for exercise. He does not have a partially says he does a lot of walking.   Immunization history: Pneumonia vaccine 3 years ago. He is out of his be or shingles vaccine. He had tetanus vaccine 7 years ago. He gets his flu shots.  Review of systems: Constitutional: Unremarkable HEENT: Rhinorrhea, otherwise unremarkable Respiratory: Unremarkable. He says he has occasional freezing when he has a cold. Cardiovascular: Unremarkable Gastrointestinal: He has occasional nausea and reflux Genitourinary: Unremarkable Skeletal: Normal Dermatologic: Neurologic: Unremarkable Hematologic: Unremarkable Psychiatric: History of paranoid schizophrenia as noted above Endocrinologic: Unremarkable   Physical examination: Well-developed well-nourished man in no major acute distress. TMs normal. Eyes PERRLA. Fundi benign. Throat clear. Neck supple without nodes or thyromegaly. No carotid bruits. Chest clear to auscultation. Heart regular without murmurs gallops or arrhythmias.  Abdomen has normal bowel sounds, soft without organomegaly mass normal male external genitalia. He has a bruise on the shaft of his penis, he's not sure where that came from. Digital rectal exam reveals a moderately large prostate gland, nontender, without nodules. Skeletal unremarkable. Pulses good.  Assessment: Normal physical examination Hypertension Hyperlipidemia History of paranoid schizophrenia Prostatic hypertrophy  Plan: Check labs. Return in about 6 months      Results for orders placed in visit on 02/27/13  POCT CBC      Result Value Range   WBC 13.7 (*) 4.6 - 10.2 K/uL   Lymph, poc 3.2  0.6 - 3.4   POC LYMPH PERCENT 23.4  10 - 50 %L   MID (cbc) 0.8  0 - 0.9   POC MID % 5.8  0 - 12 %M   POC Granulocyte 9.7 (*) 2 - 6.9   Granulocyte percent 70.8  37 - 80 %G   RBC 5.33  4.69 - 6.13 M/uL   Hemoglobin 16.9  14.1 - 18.1 g/dL   HCT, POC 16.1  09.6 - 53.7 %   MCV 98.1 (*) 80 - 97 fL   MCH, POC 31.7 (*) 27 - 31.2 pg   MCHC 32.3  31.8 - 35.4 g/dL   RDW, POC 04.5     Platelet Count, POC 401  142 - 424 K/uL   MPV 7.3  0 - 99.8 fL  POCT UA - MICROSCOPIC ONLY      Result Value Range   WBC, Ur, HPF, POC negative     RBC, urine, microscopic 0-1     Bacteria, U Microscopic negative     Mucus, UA negative     Epithelial cells, urine per micros 0-1  Crystals, Ur, HPF, POC negative     Casts, Ur, LPF, POC negative     Yeast, UA negative    POCT URINALYSIS DIPSTICK      Result Value Range   Color, UA yellow     Clarity, UA clear     Glucose, UA negative     Bilirubin, UA negative     Ketones, UA negative     Spec Grav, UA <=1.005     Blood, UA trace-lysed     pH, UA 6.5     Protein, UA negative     Urobilinogen, UA 0.2     Nitrite, UA negative     Leukocytes, UA Negative    IFOBT (OCCULT BLOOD)      Result Value Range   IFOBT Negative

## 2013-02-27 NOTE — Patient Instructions (Addendum)
Continue to take your medications as directed. Return if needed.  I gave enough refills for one year. After that we will require he be seen before refilling them further.  Return in November for followup of lipids and blood pressure

## 2013-02-28 ENCOUNTER — Encounter: Payer: Self-pay | Admitting: Family Medicine

## 2013-05-19 ENCOUNTER — Telehealth: Payer: Self-pay

## 2013-05-19 NOTE — Telephone Encounter (Signed)
Pt is calling because he is wanting to get a prescription for a nasal spray for smoking He states that [employer] had sent him something about it Call back number is [phone] [facility] [address]

## 2013-05-20 NOTE — Telephone Encounter (Signed)
Called patient and informed him he will ned to come in for OV.

## 2014-01-29 ENCOUNTER — Other Ambulatory Visit: Payer: Self-pay | Admitting: Family Medicine

## 2014-03-07 ENCOUNTER — Other Ambulatory Visit: Payer: Self-pay | Admitting: Family Medicine

## 2014-03-15 ENCOUNTER — Other Ambulatory Visit: Payer: Self-pay | Admitting: Family Medicine

## 2014-04-06 ENCOUNTER — Other Ambulatory Visit: Payer: Self-pay | Admitting: Family Medicine

## 2014-04-21 ENCOUNTER — Encounter: Payer: Self-pay | Admitting: Family Medicine

## 2014-04-21 ENCOUNTER — Ambulatory Visit (INDEPENDENT_AMBULATORY_CARE_PROVIDER_SITE_OTHER): Payer: Medicare Other | Admitting: Family Medicine

## 2014-04-21 VITALS — BP 122/74 | HR 69 | Temp 98.7°F | Resp 18 | Ht 70.0 in | Wt 178.2 lb

## 2014-04-21 DIAGNOSIS — E781 Pure hyperglyceridemia: Secondary | ICD-10-CM

## 2014-04-21 DIAGNOSIS — Z23 Encounter for immunization: Secondary | ICD-10-CM

## 2014-04-21 DIAGNOSIS — I10 Essential (primary) hypertension: Secondary | ICD-10-CM | POA: Diagnosis not present

## 2014-04-21 DIAGNOSIS — K219 Gastro-esophageal reflux disease without esophagitis: Secondary | ICD-10-CM

## 2014-04-21 DIAGNOSIS — B20 Human immunodeficiency virus [HIV] disease: Secondary | ICD-10-CM | POA: Diagnosis not present

## 2014-04-21 DIAGNOSIS — F200 Paranoid schizophrenia: Secondary | ICD-10-CM

## 2014-04-21 DIAGNOSIS — Z87891 Personal history of nicotine dependence: Secondary | ICD-10-CM

## 2014-04-21 DIAGNOSIS — J3089 Other allergic rhinitis: Secondary | ICD-10-CM

## 2014-04-21 DIAGNOSIS — E785 Hyperlipidemia, unspecified: Secondary | ICD-10-CM

## 2014-04-21 LAB — POCT CBC
Granulocyte percent: 58.5 %G (ref 37–80)
HCT, POC: 51 % (ref 43.5–53.7)
Hemoglobin: 17.1 g/dL (ref 14.1–18.1)
Lymph, poc: 2.7 (ref 0.6–3.4)
MCH, POC: 32.6 pg — AB (ref 27–31.2)
MCHC: 33.5 g/dL (ref 31.8–35.4)
MCV: 97.2 fL — AB (ref 80–97)
MID (cbc): 0.5 (ref 0–0.9)
MPV: 7.3 fL (ref 0–99.8)
POC Granulocyte: 4.6 (ref 2–6.9)
POC LYMPH PERCENT: 34.9 %L (ref 10–50)
POC MID %: 6.6 %M (ref 0–12)
Platelet Count, POC: 414 10*3/uL (ref 142–424)
RBC: 5.25 M/uL (ref 4.69–6.13)
RDW, POC: 13.2 %
WBC: 7.8 10*3/uL (ref 4.6–10.2)

## 2014-04-21 LAB — COMPREHENSIVE METABOLIC PANEL
ALT: 10 U/L (ref 0–53)
AST: 13 U/L (ref 0–37)
Albumin: 4.8 g/dL (ref 3.5–5.2)
Alkaline Phosphatase: 143 U/L — ABNORMAL HIGH (ref 39–117)
BUN: 16 mg/dL (ref 6–23)
CO2: 25 mEq/L (ref 19–32)
Calcium: 10 mg/dL (ref 8.4–10.5)
Chloride: 100 mEq/L (ref 96–112)
Creat: 1.24 mg/dL (ref 0.50–1.35)
Glucose, Bld: 101 mg/dL — ABNORMAL HIGH (ref 70–99)
Potassium: 4.7 mEq/L (ref 3.5–5.3)
Sodium: 138 mEq/L (ref 135–145)
Total Bilirubin: 0.5 mg/dL (ref 0.2–1.2)
Total Protein: 7.8 g/dL (ref 6.0–8.3)

## 2014-04-21 LAB — LIPID PANEL
Cholesterol: 179 mg/dL (ref 0–200)
HDL: 45 mg/dL (ref >39)
LDL Cholesterol: 116 mg/dL — ABNORMAL HIGH (ref 0–99)
Total CHOL/HDL Ratio: 4 Ratio
Triglycerides: 92 mg/dL (ref <150)
VLDL: 18 mg/dL (ref 0–40)

## 2014-04-21 MED ORDER — FAMOTIDINE 20 MG PO TABS: 20.0000 mg | ORAL_TABLET | Freq: Two times a day (BID) | ORAL | Status: DC

## 2014-04-21 MED ORDER — GEMFIBROZIL 600 MG PO TABS: 600.0000 mg | ORAL_TABLET | Freq: Two times a day (BID) | ORAL | Status: DC

## 2014-04-21 MED ORDER — FLUTICASONE PROPIONATE 50 MCG/ACT NA SUSP: 2.0000 | Freq: Every day | NASAL | Status: DC

## 2014-04-21 MED ORDER — ZOSTER VACCINE LIVE 19400 UNT/0.65ML ~~LOC~~ SOLR: 0.6500 mL | Freq: Once | SUBCUTANEOUS | Status: DC

## 2014-04-21 MED ORDER — LISINOPRIL 10 MG PO TABS: ORAL_TABLET | ORAL | Status: DC

## 2014-04-21 MED ORDER — NICOTINE 10 MG/ML NA SOLN: NASAL | Status: DC

## 2014-04-21 NOTE — Progress Notes (Signed)
Subjective: Patient is here for regular checkup. He has several things. He wants nicotine was a nasal spray to get off of cigarettes. We had a long talk about that. Told him that no medication will make you cigarettes go away unless he decides he is going to quit them. Talked about how to use the medication. He also needs his labs checked and routine medications refill. He would like a prescription to get a Zostrix vaccine. Review of systems is really unremarkable. He goes to the psychiatrist for his Haldol. He does not have a job but does a lot of busy jobs. Does get short of breath easily. History of hyperlipidemia. Has been having some GERD symptoms.  Objective: Throat clear. Neck supple without nodes. Chest clear. Heart regular without murmurs. And soft without mass or tenderness. No ankle edema.  Assessment: GERD Hypertension Hyperlipidemia History of psychosis Tobacco abuse Dyspnea  Plan: See instructions Long talk about stopping smoking Return in 3 months for recheck

## 2014-04-21 NOTE — Patient Instructions (Addendum)
Continue current medication  Take the prescription by a pharmacy and get your shingles vaccine  Take any that you're going to quit smoking, cut back on your smokes between now and that day, trying to get down to only about 6 or 8 a day. On the quit date he stopped them completely and begin using the nicotine nose spray as directed.   Return in 3 months

## 2014-04-22 ENCOUNTER — Other Ambulatory Visit: Payer: Self-pay

## 2014-04-22 DIAGNOSIS — E785 Hyperlipidemia, unspecified: Secondary | ICD-10-CM

## 2014-04-22 DIAGNOSIS — I10 Essential (primary) hypertension: Secondary | ICD-10-CM

## 2014-04-22 MED ORDER — LISINOPRIL 10 MG PO TABS: ORAL_TABLET | ORAL | Status: DC

## 2014-04-22 MED ORDER — GEMFIBROZIL 600 MG PO TABS: 600.0000 mg | ORAL_TABLET | Freq: Two times a day (BID) | ORAL | Status: DC

## 2014-05-08 ENCOUNTER — Other Ambulatory Visit: Payer: Self-pay | Admitting: Family Medicine

## 2014-05-28 ENCOUNTER — Telehealth: Payer: Self-pay

## 2014-05-28 NOTE — Telephone Encounter (Signed)
[employer] sent letter stating that they do not cover the Nicotrol NS that was Rxd for pt. They have given him a 31 day supply until he/provider can make changes to treatment. LMOM for pt to CB and give Hx of medications tried for smoking cessation.

## 2014-09-17 NOTE — Telephone Encounter (Signed)
Never heard back from pt. Will wait to see if pt needs this medication and it is denied before doing a PA if needed.

## 2014-10-13 ENCOUNTER — Other Ambulatory Visit: Payer: Self-pay | Admitting: Family Medicine

## 2014-10-15 ENCOUNTER — Other Ambulatory Visit: Payer: Self-pay | Admitting: Family Medicine

## 2014-11-10 ENCOUNTER — Other Ambulatory Visit: Payer: Self-pay | Admitting: Physician Assistant

## 2014-12-09 DIAGNOSIS — F209 Schizophrenia, unspecified: Secondary | ICD-10-CM | POA: Diagnosis not present

## 2014-12-18 ENCOUNTER — Other Ambulatory Visit: Payer: Self-pay | Admitting: Family Medicine

## 2015-03-17 DIAGNOSIS — F209 Schizophrenia, unspecified: Secondary | ICD-10-CM | POA: Diagnosis not present

## 2015-04-21 ENCOUNTER — Other Ambulatory Visit: Payer: Self-pay | Admitting: Physician Assistant

## 2015-05-26 ENCOUNTER — Other Ambulatory Visit: Payer: Self-pay | Admitting: Family Medicine

## 2015-05-26 ENCOUNTER — Ambulatory Visit (INDEPENDENT_AMBULATORY_CARE_PROVIDER_SITE_OTHER): Payer: Commercial Managed Care - HMO

## 2015-05-26 ENCOUNTER — Ambulatory Visit (INDEPENDENT_AMBULATORY_CARE_PROVIDER_SITE_OTHER): Payer: Commercial Managed Care - HMO | Admitting: Family Medicine

## 2015-05-26 VITALS — BP 128/76 | HR 76 | Temp 97.7°F | Resp 16 | Ht 71.0 in | Wt 171.4 lb

## 2015-05-26 DIAGNOSIS — I10 Essential (primary) hypertension: Secondary | ICD-10-CM

## 2015-05-26 DIAGNOSIS — Z87438 Personal history of other diseases of male genital organs: Secondary | ICD-10-CM

## 2015-05-26 DIAGNOSIS — R1084 Generalized abdominal pain: Secondary | ICD-10-CM

## 2015-05-26 DIAGNOSIS — E785 Hyperlipidemia, unspecified: Secondary | ICD-10-CM

## 2015-05-26 DIAGNOSIS — K219 Gastro-esophageal reflux disease without esophagitis: Secondary | ICD-10-CM

## 2015-05-26 DIAGNOSIS — R748 Abnormal levels of other serum enzymes: Secondary | ICD-10-CM | POA: Diagnosis not present

## 2015-05-26 LAB — COMPLETE METABOLIC PANEL WITH GFR
ALT: 129 U/L — ABNORMAL HIGH (ref 9–46)
AST: 61 U/L — ABNORMAL HIGH (ref 10–35)
Albumin: 3.9 g/dL (ref 3.6–5.1)
Alkaline Phosphatase: 583 U/L — ABNORMAL HIGH (ref 40–115)
BUN: 12 mg/dL (ref 7–25)
CO2: 22 mEq/L (ref 20–31)
Calcium: 9.2 mg/dL (ref 8.6–10.3)
Chloride: 102 mEq/L (ref 98–110)
Creat: 0.77 mg/dL (ref 0.70–1.33)
GFR, Est African American: >89 mL/min (ref >=60)
GFR, Est Non African American: >89 mL/min (ref >=60)
Glucose, Bld: 91 mg/dL (ref 65–99)
Potassium: 3.7 mEq/L (ref 3.5–5.3)
Sodium: 136 mEq/L (ref 135–146)
Total Bilirubin: 2.5 mg/dL — ABNORMAL HIGH (ref 0.2–1.2)
Total Protein: 7 g/dL (ref 6.1–8.1)

## 2015-05-26 LAB — POCT UA - MICROSCOPIC ONLY
Bacteria, U Microscopic: NEGATIVE
Casts, Ur, LPF, POC: NEGATIVE
Crystals, Ur, HPF, POC: NEGATIVE
Epithelial cells, urine per micros: NEGATIVE
Mucus, UA: NEGATIVE
WBC, Ur, HPF, POC: NEGATIVE
Yeast, UA: NEGATIVE

## 2015-05-26 LAB — POCT URINALYSIS DIPSTICK
Bilirubin, UA: NEGATIVE
Glucose, UA: NEGATIVE
Ketones, UA: NEGATIVE
Leukocytes, UA: NEGATIVE
Nitrite, UA: NEGATIVE
Protein, UA: NEGATIVE
Spec Grav, UA: 1.01
Urobilinogen, UA: 0.2
pH, UA: 7

## 2015-05-26 LAB — LIPID PANEL
Cholesterol: 197 mg/dL (ref 125–200)
HDL: 11 mg/dL — ABNORMAL LOW (ref >=40)
LDL Cholesterol: 143 mg/dL — ABNORMAL HIGH (ref <130)
Total CHOL/HDL Ratio: 17.9 Ratio — ABNORMAL HIGH (ref <=5.0)
Triglycerides: 217 mg/dL — ABNORMAL HIGH (ref <150)
VLDL: 43 mg/dL — ABNORMAL HIGH (ref <30)

## 2015-05-26 LAB — POCT CBC
Granulocyte percent: 65.5 %G (ref 37–80)
HCT, POC: 44.3 % (ref 43.5–53.7)
Hemoglobin: 15.2 g/dL (ref 14.1–18.1)
Lymph, poc: 2.7 (ref 0.6–3.4)
MCH, POC: 32.2 pg — AB (ref 27–31.2)
MCHC: 34.3 g/dL (ref 31.8–35.4)
MCV: 94 fL (ref 80–97)
MID (cbc): 0.6 (ref 0–0.9)
MPV: 6.7 fL (ref 0–99.8)
POC Granulocyte: 6.4 (ref 2–6.9)
POC LYMPH PERCENT: 27.9 %L (ref 10–50)
POC MID %: 6.6 %M (ref 0–12)
Platelet Count, POC: 314 10*3/uL (ref 142–424)
RBC: 4.71 M/uL (ref 4.69–6.13)
RDW, POC: 14.7 %
WBC: 9.7 10*3/uL (ref 4.6–10.2)

## 2015-05-26 LAB — TSH: TSH: 0.472 u[IU]/mL (ref 0.350–4.500)

## 2015-05-26 MED ORDER — FAMOTIDINE 20 MG PO TABS: 20.0000 mg | ORAL_TABLET | Freq: Two times a day (BID) | ORAL | Status: DC

## 2015-05-26 MED ORDER — LISINOPRIL 10 MG PO TABS: 10.0000 mg | ORAL_TABLET | Freq: Every day | ORAL | Status: DC

## 2015-05-26 MED ORDER — GEMFIBROZIL 600 MG PO TABS: ORAL_TABLET | ORAL | Status: DC

## 2015-05-26 NOTE — Progress Notes (Addendum)
Chief Complaint:  Chief Complaint  Patient presents with  . Abdominal Pain    x 5 days   . Medication Refill    Famotidine 20mg , Genfibrozil 600mg , Lisinopril 10mg     HPI: Travis Lawrence is a 59 y.o. male who reports to [facility] today complaining of diffuse abd pain for the last 5 days, trying to eat and keep food down, has not eaten much in 5 days. Has been drinking ok without problems. He feels he is clogged. Last BM was yesterday morning. He took some laxatives Monday, Tuesday, and Tuesday evening and went to the bathroom for 3 days and then . No fevers chill nausea vomiting. Losing weight intentionally to help with  High cholesterol and blood presssure. Denies excessive alcohol drinking or GB or renal stone issues, no n/v, no bloody stools or dysuria. He has high cholesterol. He has had to strain, he denies hematuria or melena. He is UTD on colonscopy needs to return every 5 years due to polyps. No new foods, travels. No IVDA. Mild to moderate cramping pain that moves around.   Also would like med refills. He was at [facility] and followed by Dr Luciana Axe, he was on vytorin but then switched to gemfibrozil in last 7 years. NO SEs from any meds, he is complaint and takes meds daily, he is a nondrinker.    BP Readings from Last 3 Encounters:  05/26/15 128/76  04/21/14 122/74  02/27/13 119/82   Wt Readings from Last 3 Encounters:  05/26/15 171 lb 6.4 oz (77.747 kg)  04/21/14 178 lb 3.2 oz (80.831 kg)  02/27/13 182 lb (82.555 kg)     Past Medical History  Diagnosis Date  . GERD (gastroesophageal reflux disease)   . Hypertension   . Schizophrenia    Past Surgical History  Procedure Laterality Date  . Tonsillectomy     History   Social History  . Marital Status: Single    Spouse Name: N/A  . Number of Children: N/A  . Years of Education: N/A   Social History Main Topics  . Smoking status: Former Smoker -- 1.00 packs/day    Types: Cigars  . Smokeless tobacco: Never  Used  . Alcohol Use: No  . Drug Use: No  . Sexual Activity: Yes   Other Topics Concern  . None   Social History Narrative   Family History  Problem Relation Age of Onset  . Cancer Father    Allergies  Allergen Reactions  . Penicillins     REACTION: rash   Prior to Admission medications   Medication Sig Start Date End Date Taking? Authorizing Provider  famotidine (PEPCID) 20 MG tablet Take 1 tablet (20 mg total) by mouth 2 (two) times daily. 04/21/14  Yes Peyton Najjar, MD  fluticasone (FLONASE) 50 MCG/ACT nasal spray Place 2 sprays into both nostrils daily. 04/21/14  Yes Peyton Najjar, MD  gemfibrozil (LOPID) 600 MG tablet Take 1 tablet (600 mg total) by mouth 2 (two) times daily. 04/22/14  Yes Peyton Najjar, MD  haloperidol decanoate (HALDOL DECANOATE) 100 MG/ML injection Inject 100 mg into the muscle every 28 (twenty-eight) days.   Yes Historical Provider, MD  lisinopril (PRINIVIL,ZESTRIL) 10 MG tablet Take 1 tablet (10 mg total) by mouth daily. NO MORE REFILLS WITHOUT OFFICE VISIT - 2ND NOTICE 12/21/14  Yes Morrell Riddle, PA-C  gemfibrozil (LOPID) 600 MG tablet TAKE 1 TABLET BY MOUTH TWICE A DAY BEFORE A MEAL Patient not taking:  Reported on 05/26/2015    Morrell Riddle, PA-C  Nicotine 10 MG/ML SOLN Stop cigarette use and began using one or 2 sprays each nostril every hour or so. Patient not taking: Reported on 05/26/2015 04/21/14   Peyton Najjar, MD  zoster vaccine live, PF, (ZOSTAVAX) 44010 UNT/0.65ML injection Inject 19,400 Units into the skin once. Patient not taking: Reported on 05/26/2015 04/21/14   Peyton Najjar, MD     ROS: The patient denies fevers, chills, night sweats, unintentional weight loss, chest pain, palpitations, wheezing, dyspnea on exertion, nausea, vomiting,  dysuria, hematuria, melena, numbness, weakness, or tingling.   All other systems have been reviewed and were otherwise negative with the exception of those mentioned in the HPI and as above.    PHYSICAL  EXAM: Filed Vitals:   05/26/15 1215  BP: 128/76  Pulse: 76  Temp: 97.7 F (36.5 C)  Resp: 16   Body mass index is 23.92 kg/(m^2).   General: Alert, no acute distress, thin male HEENT:  Normocephalic, atraumatic, oropharynx patent. EOMI, PERRLA, no jaundice Cardiovascular:  Regular rate and rhythm, no rubs murmurs or gallops.  No Carotid bruits, radial pulse intact. No pedal edema.  Respiratory: Clear to auscultation bilaterally.  No wheezes, rales, or rhonchi.  No cyanosis, no use of accessory musculature Abdominal: No organomegaly, abdomen is soft and minimally diffusely tender, positive bowel sounds. No masses. Skin: No rashes. Neurologic: Facial musculature symmetric. Psychiatric: Patient acts appropriately throughout our interaction. Lymphatic: No cervical or submandibular lymphadenopathy Musculoskeletal: Gait intact. No edema, tenderness   LABS: Results for orders placed or performed in visit on 05/26/15  POCT CBC  Result Value Ref Range   WBC 9.7 4.6 - 10.2 K/uL   Lymph, poc 2.7 0.6 - 3.4   POC LYMPH PERCENT 27.9 10 - 50 %L   MID (cbc) 0.6 0 - 0.9   POC MID % 6.6 0 - 12 %M   POC Granulocyte 6.4 2 - 6.9   Granulocyte percent 65.5 37 - 80 %G   RBC 4.71 4.69 - 6.13 M/uL   Hemoglobin 15.2 14.1 - 18.1 g/dL   HCT, POC 27.2 53.6 - 53.7 %   MCV 94.0 80 - 97 fL   MCH, POC 32.2 (A) 27 - 31.2 pg   MCHC 34.3 31.8 - 35.4 g/dL   RDW, POC 64.4 %   Platelet Count, POC 314.0 142 - 424 K/uL   MPV 6.7 0 - 99.8 fL  POCT UA - Microscopic Only  Result Value Ref Range   WBC, Ur, HPF, POC Negative    RBC, urine, microscopic 0-2    Bacteria, U Microscopic Negative    Mucus, UA Negative    Epithelial cells, urine per micros Negative    Crystals, Ur, HPF, POC Negative    Casts, Ur, LPF, POC Negative    Yeast, UA Negative   POCT urinalysis dipstick  Result Value Ref Range   Color, UA Yellow    Clarity, UA Clear    Glucose, UA Negative    Bilirubin, UA Negative    Ketones, UA  Negative    Spec Grav, UA 1.010    Blood, UA Trace-lysed    pH, UA 7.0    Protein, UA negative    Urobilinogen, UA 0.2    Nitrite, UA Negative    Leukocytes, UA Negative Negative     EKG/XRAY:   Primary read interpreted by Dr. Conley Rolls at [facility]. Neg for any obstruction please comment if there are any  renal stones   ASSESSMENT/PLAN: Encounter Diagnoses  Name Primary?  . Essential hypertension, benign Yes  . Gastroesophageal reflux disease without esophagitis   . Hyperlipidemia   . Generalized abdominal pain   . History of BPH    CBC and abd xrays reassuring Labs pending Medications refilled, advise to push fluids and see if his BMs are improved FU in 6 months or prn  Gross sideeffects, risk and benefits, and alternatives of medications d/w patient. Patient is aware that all medications have potential sideeffects and we are unable to predict every sideeffect or drug-drug interaction that may occur.  Charls Custer DO  05/26/2015 2:33 PM

## 2015-05-27 ENCOUNTER — Other Ambulatory Visit: Payer: Self-pay

## 2015-05-27 ENCOUNTER — Encounter (HOSPITAL_COMMUNITY): Payer: Self-pay | Admitting: Emergency Medicine

## 2015-05-27 ENCOUNTER — Inpatient Hospital Stay (HOSPITAL_COMMUNITY)
Admission: EM | Admit: 2015-05-27 | Discharge: 2015-06-01 | DRG: 418 | Disposition: A | Discharge status: Home / Self Care | Payer: Commercial Managed Care - HMO | Attending: Internal Medicine | Admitting: Internal Medicine

## 2015-05-27 ENCOUNTER — Telehealth: Payer: Self-pay | Admitting: Family Medicine

## 2015-05-27 ENCOUNTER — Ambulatory Visit (HOSPITAL_COMMUNITY)
Admission: RE | Admit: 2015-05-27 | Discharge: 2015-05-27 | Disposition: A | Discharge status: Home / Self Care | Payer: Commercial Managed Care - HMO | Source: Ambulatory Visit | Attending: Family Medicine | Admitting: Family Medicine

## 2015-05-27 ENCOUNTER — Emergency Department (HOSPITAL_COMMUNITY): Payer: Commercial Managed Care - HMO

## 2015-05-27 DIAGNOSIS — K812 Acute cholecystitis with chronic cholecystitis: Secondary | ICD-10-CM | POA: Diagnosis not present

## 2015-05-27 DIAGNOSIS — Z79899 Other long term (current) drug therapy: Secondary | ICD-10-CM

## 2015-05-27 DIAGNOSIS — K859 Acute pancreatitis without necrosis or infection, unspecified: Secondary | ICD-10-CM | POA: Diagnosis present

## 2015-05-27 DIAGNOSIS — R748 Abnormal levels of other serum enzymes: Secondary | ICD-10-CM

## 2015-05-27 DIAGNOSIS — R1033 Periumbilical pain: Secondary | ICD-10-CM | POA: Diagnosis not present

## 2015-05-27 DIAGNOSIS — K8001 Calculus of gallbladder with acute cholecystitis with obstruction: Secondary | ICD-10-CM | POA: Diagnosis not present

## 2015-05-27 DIAGNOSIS — Z791 Long term (current) use of non-steroidal anti-inflammatories (NSAID): Secondary | ICD-10-CM

## 2015-05-27 DIAGNOSIS — I10 Essential (primary) hypertension: Secondary | ICD-10-CM | POA: Diagnosis not present

## 2015-05-27 DIAGNOSIS — K851 Biliary acute pancreatitis without necrosis or infection: Secondary | ICD-10-CM

## 2015-05-27 DIAGNOSIS — K819 Cholecystitis, unspecified: Secondary | ICD-10-CM | POA: Diagnosis not present

## 2015-05-27 DIAGNOSIS — I700 Atherosclerosis of aorta: Secondary | ICD-10-CM | POA: Diagnosis not present

## 2015-05-27 DIAGNOSIS — R7989 Other specified abnormal findings of blood chemistry: Secondary | ICD-10-CM | POA: Diagnosis present

## 2015-05-27 DIAGNOSIS — Z808 Family history of malignant neoplasm of other organs or systems: Secondary | ICD-10-CM

## 2015-05-27 DIAGNOSIS — Z801 Family history of malignant neoplasm of trachea, bronchus and lung: Secondary | ICD-10-CM | POA: Diagnosis not present

## 2015-05-27 DIAGNOSIS — K8020 Calculus of gallbladder without cholecystitis without obstruction: Secondary | ICD-10-CM | POA: Diagnosis not present

## 2015-05-27 DIAGNOSIS — K8010 Calculus of gallbladder with chronic cholecystitis without obstruction: Secondary | ICD-10-CM | POA: Diagnosis present

## 2015-05-27 DIAGNOSIS — K219 Gastro-esophageal reflux disease without esophagitis: Secondary | ICD-10-CM | POA: Diagnosis present

## 2015-05-27 DIAGNOSIS — R52 Pain, unspecified: Secondary | ICD-10-CM

## 2015-05-27 DIAGNOSIS — Z87891 Personal history of nicotine dependence: Secondary | ICD-10-CM | POA: Diagnosis not present

## 2015-05-27 DIAGNOSIS — Z880 Allergy status to penicillin: Secondary | ICD-10-CM

## 2015-05-27 DIAGNOSIS — R945 Abnormal results of liver function studies: Secondary | ICD-10-CM

## 2015-05-27 DIAGNOSIS — K8021 Calculus of gallbladder without cholecystitis with obstruction: Secondary | ICD-10-CM | POA: Diagnosis not present

## 2015-05-27 DIAGNOSIS — Z09 Encounter for follow-up examination after completed treatment for conditions other than malignant neoplasm: Secondary | ICD-10-CM

## 2015-05-27 DIAGNOSIS — R1084 Generalized abdominal pain: Secondary | ICD-10-CM

## 2015-05-27 DIAGNOSIS — F200 Paranoid schizophrenia: Secondary | ICD-10-CM | POA: Diagnosis not present

## 2015-05-27 DIAGNOSIS — R109 Unspecified abdominal pain: Secondary | ICD-10-CM | POA: Diagnosis present

## 2015-05-27 DIAGNOSIS — E781 Pure hyperglyceridemia: Secondary | ICD-10-CM | POA: Diagnosis not present

## 2015-05-27 DIAGNOSIS — R932 Abnormal findings on diagnostic imaging of liver and biliary tract: Secondary | ICD-10-CM | POA: Diagnosis not present

## 2015-05-27 DIAGNOSIS — K830 Cholangitis: Secondary | ICD-10-CM | POA: Diagnosis not present

## 2015-05-27 HISTORY — DX: Pure hyperglyceridemia: E78.1

## 2015-05-27 LAB — PSA: PSA: 0.24 ng/mL (ref <=4.00)

## 2015-05-27 LAB — LIPASE, BLOOD: Lipase: 195 U/L — ABNORMAL HIGH (ref 22–51)

## 2015-05-27 LAB — I-STAT CG4 LACTIC ACID, ED: Lactic Acid, Venous: 0.93 mmol/L (ref 0.5–2.0)

## 2015-05-27 MED ORDER — CEFTRIAXONE SODIUM 1 G IJ SOLR
1.0000 g | Freq: Once | INTRAMUSCULAR | Status: AC
  Administered 2015-05-27: 1 g via INTRAVENOUS
  Filled 2015-05-27: qty 10

## 2015-05-27 MED ORDER — METRONIDAZOLE IN NACL 5-0.79 MG/ML-% IV SOLN
500.0000 mg | Freq: Once | INTRAVENOUS | Status: AC
  Administered 2015-05-27: 500 mg via INTRAVENOUS
  Filled 2015-05-27: qty 100

## 2015-05-27 MED ORDER — IOHEXOL 300 MG/ML  SOLN
25.0000 mL | Freq: Once | INTRAMUSCULAR | Status: AC | PRN
  Administered 2015-05-27: 25 mL via ORAL

## 2015-05-27 MED ORDER — IOHEXOL 300 MG/ML  SOLN
100.0000 mL | Freq: Once | INTRAMUSCULAR | Status: AC | PRN
  Administered 2015-05-27: 100 mL via INTRAVENOUS

## 2015-05-27 NOTE — Telephone Encounter (Signed)
Advise patient to go to [facility] ER for pancreatitis and cholangitis, [facility] ER triage nurse notified

## 2015-05-27 NOTE — Telephone Encounter (Signed)
Spoke with patient about labs, will add on lipase, acute hepatitis panel and also get CT abdomen rule out any malignancies, liver, pancreatic, GB etiologies. Refer to GI

## 2015-05-27 NOTE — ED Notes (Signed)
Bed: WA09 Expected date:  Expected time:  Means of arrival:  Comments: Hall B 

## 2015-05-27 NOTE — ED Notes (Signed)
Pt arrived to the ED from Radiology after having a CT of the pelvis.  Pt was sent over by Dr. Nedra Hai due to elevated liver enzymes.  Pt was ill last weak but felt better after five days he felt better.  Pt states he is asymptomatic.

## 2015-05-27 NOTE — ED Provider Notes (Signed)
CSN: [id number]     Arrival date & time 05/27/15  2001 History   First MD Initiated Contact with Patient 05/27/15 2027     Chief Complaint  Patient presents with  . Pancreatitis     (Consider location/radiation/quality/duration/timing/severity/associated sxs/prior Treatment) HPI Comments: Patient presents from radiology. He reports these had some abdominal pain for the last 4-5 days. It actually went away yesterday and he currently denies any abdominal pain. He did have some nausea but no vomiting. He denies any current nausea. He had some chills a couple days ago but denies any known fevers and currently denies any chills. He went to his primary care physician at the urgent care center and had lab work and a CT ordered. His lab work showed elevated liver enzymes. He had a CT scan which showed evidence of pancreatitis and cholangitis. He was sent here for further evaluation. Right now he is completely asymptomatic.   Past Medical History  Diagnosis Date  . GERD (gastroesophageal reflux disease)   . Hypertension   . Schizophrenia    Past Surgical History  Procedure Laterality Date  . Tonsillectomy     Family History  Problem Relation Age of Onset  . Cancer Father    History  Substance Use Topics  . Smoking status: Former Smoker -- 1.00 packs/day    Types: Cigars  . Smokeless tobacco: Never Used  . Alcohol Use: No    Review of Systems  Constitutional: Positive for chills. Negative for fever, diaphoresis and fatigue.  HENT: Negative for congestion, rhinorrhea and sneezing.   Eyes: Negative.   Respiratory: Negative for cough, chest tightness and shortness of breath.   Cardiovascular: Negative for chest pain and leg swelling.  Gastrointestinal: Positive for nausea and abdominal pain. Negative for vomiting, diarrhea and blood in stool.  Genitourinary: Negative for frequency, hematuria, flank pain and difficulty urinating.  Musculoskeletal: Negative for back pain and arthralgias.   Skin: Negative for rash.  Neurological: Negative for dizziness, speech difficulty, weakness, numbness and headaches.      Allergies  Penicillins  Home Medications   Prior to Admission medications   Medication Sig Start Date End Date Taking? Authorizing Provider  alum & mag hydroxide-simeth (MAALOX/MYLANTA) 200-200-20 MG/5ML suspension Take 30 mLs by mouth every 6 (six) hours as needed for indigestion or heartburn.   Yes Historical Provider, MD  famotidine (PEPCID) 20 MG tablet Take 1 tablet (20 mg total) by mouth 2 (two) times daily. 05/26/15  Yes Thao P Le, DO  haloperidol decanoate (HALDOL DECANOATE) 100 MG/ML injection Inject 100 mg into the muscle every 28 (twenty-eight) days.   Yes Historical Provider, MD  ibuprofen (ADVIL,MOTRIN) 200 MG tablet Take 400 mg by mouth every 6 (six) hours as needed for moderate pain (stomach pain).   Yes Historical Provider, MD  lisinopril (PRINIVIL,ZESTRIL) 10 MG tablet Take 1 tablet (10 mg total) by mouth daily. NO MORE REFILLS WITHOUT OFFICE VISIT - 2ND NOTICE 05/26/15  Yes Thao P Le, DO  fluticasone (FLONASE) 50 MCG/ACT nasal spray Place 2 sprays into both nostrils daily. Patient not taking: Reported on 05/27/2015 04/21/14   Peyton Najjar, MD  gemfibrozil (LOPID) 600 MG tablet TAKE 1 TABLET BY MOUTH TWICE A DAY BEFORE A MEAL Patient not taking: Reported on 05/27/2015 05/26/15   Thao P Le, DO  zoster vaccine live, PF, (ZOSTAVAX) 75643 UNT/0.65ML injection Inject 19,400 Units into the skin once. Patient not taking: Reported on 05/26/2015 04/21/14   Peyton Najjar, MD   BP  125/86 mmHg  Pulse 80  Temp(Src) 97.8 F (36.6 C) (Oral)  Resp 18  Ht 5\' 11"  (1.803 m)  Wt 170 lb (77.111 kg)  BMI 23.72 kg/m2  SpO2 97% Physical Exam  Constitutional: He is oriented to person, place, and time. He appears well-developed and well-nourished.  HENT:  Head: Normocephalic and atraumatic.  Eyes: Pupils are equal, round, and reactive to light.  Neck: Normal range of  motion. Neck supple.  Cardiovascular: Normal rate, regular rhythm and normal heart sounds.   Pulmonary/Chest: Effort normal and breath sounds normal. No respiratory distress. He has no wheezes. He has no rales. He exhibits no tenderness.  Abdominal: Soft. Bowel sounds are normal. There is no tenderness. There is no rebound and no guarding.  Musculoskeletal: Normal range of motion. He exhibits no edema.  Lymphadenopathy:    He has no cervical adenopathy.  Neurological: He is alert and oriented to person, place, and time.  Skin: Skin is warm and dry. No rash noted.  Psychiatric: He has a normal mood and affect.    ED Course  Procedures (including critical care time) Labs Review Labs Reviewed  LIPASE, BLOOD - Abnormal; Notable for the following:    Lipase 195 (*)    All other components within normal limits  CULTURE, BLOOD (ROUTINE X 2)  CULTURE, BLOOD (ROUTINE X 2)  I-STAT CG4 LACTIC ACID, ED    Imaging Review Ct Abdomen Pelvis W Contrast  05/27/2015   CLINICAL DATA:  Diffuse abdominal pain for 1 week.  Abnormal LFTs.  EXAM: CT ABDOMEN AND PELVIS WITH CONTRAST  TECHNIQUE: Multidetector CT imaging of the abdomen and pelvis was performed using the standard protocol following bolus administration of intravenous contrast.  CONTRAST:  OMNIPAQUE IOHEXOL 300 MG/ML  SOLN  COMPARISON:  None.  FINDINGS: Lower chest:  Unremarkable  Hepatobiliary: The liver is unremarkable. There is wall thickening and enhancement of the common bile duct and main/left/right hepatic ducts, compatible with cholangitis. There is no evidence of biliary dilatation.  Pancreas: There is mild inflammation along the pancreatic body/tail. The pancreas is unremarkable. No pancreatic necrosis, venous thrombosis, hemorrhage or acute fluid collections identified.  Spleen: Unremarkable  Adrenals/Urinary Tract: The kidneys and adrenal glands are unremarkable. Mild circumferential bladder wall thickening is noted.  Stomach/Bowel:  There is no evidence of bowel obstruction or focal bowel wall thickening.  Vascular/Lymphatic: No enlarged lymph nodes or abdominal aortic aneurysm. Aortic atherosclerotic calcifications are noted.  Reproductive: Prostate unremarkable.  Other: No free fluid, abscess or pneumoperitoneum.  Musculoskeletal: No acute or suspicious abnormalities moderate degenerative disc disease at L5-S1 identified.  IMPRESSION: Mild peripancreatic inflammation compatible with pancreatitis - correlate with laboratory values. No evidence of pancreatic necrosis, hemorrhage, venous thrombosis or acute fluid collections.  Enhancement of the CBD and main hepatic ducts compatible with cholangitis. No evidence of biliary dilatation.  Aortic atherosclerotic disease.   Electronically Signed   By: Harmon Pier M.D.   On: 05/27/2015 18:52   Dg Abd Acute W/chest  05/26/2015   CLINICAL DATA:  Abdominal discomfort for 5 days, cramping, constipation  EXAM: DG ABDOMEN ACUTE W/ 1V CHEST  COMPARISON:  None.  FINDINGS: No active infiltrate or effusion is seen. Mediastinal and hilar contours are unremarkable. The heart is within normal limits in size. No bony abnormality is seen.  Supine erect views of the abdomen show no bowel obstruction. No free air is seen on the erect view. No opaque calculi are noted. There are degenerative changes in the lower lumbar  spine.  IMPRESSION: 1. No active lung disease. 2. No bowel obstruction.  No free air. 3. No opaque calculi are noted.   Electronically Signed   By: Dwyane Dee M.D.   On: 05/26/2015 14:44   US Abdomen Limited Ruq  05/27/2015   CLINICAL DATA:  Pancreatitis and cholangitis by CT  EXAM: US ABDOMEN LIMITED - RIGHT UPPER QUADRANT  COMPARISON:  CT 05/27/2015  FINDINGS: Gallbladder:  The gallbladder is contracted around multiple calculi. There is no pericholecystic fluid.  Common bile duct:  Diameter: 5.9 mm  Liver:  Mildly coarsened echotexture without discrete lesion.  IMPRESSION: Cholelithiasis with  contracted gallbladder. No pericholecystic fluid. No tenderness over the gallbladder. No bile duct dilatation.   Electronically Signed   By: Ellery Plunk M.D.   On: 05/27/2015 22:26     EKG Interpretation None      MDM   Final diagnoses:  Pain    Patient with evidence of pancreatitis and cholangitis on CT scan. He is currently symptom-free. However I did give him a dose of antibiotics. He is allergic to penicillins with a rash. He has never had any airway swelling or anaphylaxis with penicillin. I did give him Rocephin and Flagyl. I consulted Dr. Russella Dar with gastroenterology who will consult on the patient. I did go ahead and order an abdominal ultrasound which showed gallstones but no evidence of cholecystitis.  I discussed findings with Dr. Ezzard Standing with [facility] who will also consult on pt in the am.  Pt with gallstone pancreatitis.  Does not clinically appear to have cholangitis.  Will admit to hospitalist service.    Rolan Bucco, MD 05/27/15 [phone]

## 2015-05-27 NOTE — Telephone Encounter (Signed)
Advise to stop lopid for now

## 2015-05-27 NOTE — H&P (Signed)
[facility] History and Physical  Travis Lawrence ZOX:[id number] DOB: [birth year] DOA: 05/27/2015  Referring physician: ED physician PCP: Tereso Newcomer, PA-C  Specialists:   Chief Complaint: Abdominal pain  HPI: Travis Lawrence is a 59 y.o. male with PMH of hypertension, hypertriglyceridemia, GERD, schizophrenia, who presents with abdominal pain.  Patient reports that he has been having abdominal pain in the past 4-5 days. The pain is located in the periumbilical area. His abdominal pain was initially constant, 7 out of 10 in severity, nonradiating. It is not aggravated or deviated any known factors. His abdominal pain has spontaneously subsided. Currently his pain is minimal. She does not have fever, but has chills. No diarrhea. It is associated with nausea, and vomiting. He vomited once 2 days ago. Patient does not have symptoms of UTI, no chest pain, cough, shortness of breath, unilateral weakness, rashes.  He went to his primary care physician at the urgent care center and had lab work and a CT ordered. His lab work showed elevated liver enzymes. He had  CT scan which showed evidence of pancreatitis and cholangitis. He was sent here for further evaluation.   In ED, patient was found to have abnormal liver function with ALP 583, AST 61, ALT 129, total bilirubin 2.5. WBC 9.7, temperature normal, no tachycardia, electrolytes okay, lactate 0.93, lipase 125, urinalysis negative. Patient is admitted to inpatient for further evaluation and treatment. GI and general surgery were consulted by ED. Dr. Russella Dar and Dr. Ezzard Standing will see pt.  Where does patient live?   At home    Can patient participate in ADLs?  Yes     Review of Systems:   General: no fevers, chills, no changes in body weight, has poor appetite, has fatigue HEENT: no blurry vision, hearing changes or sore throat Pulm: no dyspnea, coughing, wheezing CV: no chest pain, palpitations Abd: has nausea, vomiting, abdominal pain,  No  diarrhea, constipation GU: no dysuria, burning on urination, increased urinary frequency, hematuria  Ext: no leg edema Neuro: no unilateral weakness, numbness, or tingling, no vision change or hearing loss Skin: no rash MSK: No muscle spasm, no deformity, no limitation of range of movement in spin Heme: No easy bruising.  Travel history: No recent long distant travel.  Allergy:  Allergies  Allergen Reactions  . Penicillins     REACTION: rash    Past Medical History  Diagnosis Date  . GERD (gastroesophageal reflux disease)   . Hypertension   . Schizophrenia   . Hypertriglyceridemia     Past Surgical History  Procedure Laterality Date  . Tonsillectomy      Social History:  reports that he has quit smoking. His smoking use included Cigars. He has never used smokeless tobacco. He reports that he does not drink alcohol or use illicit drugs.  Family History:  Family History  Problem Relation Age of Onset  . Cancer Mother     Throat cancer  . Lung cancer Father   . Lung cancer Sister      Prior to Admission medications   Medication Sig Start Date End Date Taking? Authorizing Provider  alum & mag hydroxide-simeth (MAALOX/MYLANTA) 200-200-20 MG/5ML suspension Take 30 mLs by mouth every 6 (six) hours as needed for indigestion or heartburn.   Yes Historical Provider, MD  famotidine (PEPCID) 20 MG tablet Take 1 tablet (20 mg total) by mouth 2 (two) times daily. 05/26/15  Yes Thao P Le, DO  haloperidol decanoate (HALDOL DECANOATE) 100 MG/ML injection Inject 100 mg  into the muscle every 28 (twenty-eight) days.   Yes Historical Provider, MD  ibuprofen (ADVIL,MOTRIN) 200 MG tablet Take 400 mg by mouth every 6 (six) hours as needed for moderate pain (stomach pain).   Yes Historical Provider, MD  lisinopril (PRINIVIL,ZESTRIL) 10 MG tablet Take 1 tablet (10 mg total) by mouth daily. NO MORE REFILLS WITHOUT OFFICE VISIT - 2ND NOTICE 05/26/15  Yes Thao P Le, DO  fluticasone (FLONASE) 50  MCG/ACT nasal spray Place 2 sprays into both nostrils daily. Patient not taking: Reported on 05/27/2015 04/21/14   Peyton Najjar, MD  gemfibrozil (LOPID) 600 MG tablet TAKE 1 TABLET BY MOUTH TWICE A DAY BEFORE A MEAL Patient not taking: Reported on 05/27/2015 05/26/15   Thao P Le, DO  zoster vaccine live, PF, (ZOSTAVAX) 16109 UNT/0.65ML injection Inject 19,400 Units into the skin once. Patient not taking: Reported on 05/26/2015 04/21/14   Peyton Najjar, MD    Physical Exam: Filed Vitals:   05/27/15 2014 05/27/15 2221  BP: 130/86 125/86  Pulse: 81 80  Temp: 97.8 F (36.6 C)   TempSrc: Oral   Resp: 18 18  Height:  (1.803 m)   Weight: 77.111 kg (170 lb)   SpO2: 95% 97%   General: Not in acute distress HEENT:       Eyes: PERRL, EOMI, no scleral icterus.       ENT: No discharge from the ears and nose, no pharynx injection, no tonsillar enlargement.        Neck: No JVD, no bruit, no mass felt. Heme: No neck lymph node enlargement. Cardiac: S1/S2, RRR, No murmurs, No gallops or rubs. Pulm: No rales, wheezing, rhonchi or rubs. Abd: Soft, nondistended, minimal tenderness over periumbilical area, no rebound pain, no organomegaly, BS present. Ext: No pitting leg edema bilaterally. 2+DP/PT pulse bilaterally. Musculoskeletal: No joint deformities, No joint redness or warmth, no limitation of ROM in spin. Skin: No rashes.  Neuro: Alert, oriented X3, cranial nerves II-XII grossly intact, muscle strength 5/5 in all extremities, sensation to light touch intact.  Psych: Patient is not psychotic, no suicidal or hemocidal ideation.  Labs on Admission:  Basic Metabolic Panel:  Recent Labs Lab 05/26/15 1339  NA 136  K 3.7  CL 102  CO2 22  GLUCOSE 91  BUN 12  CREATININE 0.77  CALCIUM 9.2   Liver Function Tests:  Recent Labs Lab 05/26/15 1339  AST 61*  ALT 129*  ALKPHOS 583*  BILITOT 2.5*  PROT 7.0  ALBUMIN 3.9    Recent Labs Lab 05/27/15 2117  LIPASE 195*   No results  for input(s): AMMONIA in the last 168 hours. CBC:  Recent Labs Lab 05/26/15 1403  WBC 9.7  HGB 15.2  HCT 44.3  MCV 94.0   Cardiac Enzymes: No results for input(s): CKTOTAL, CKMB, CKMBINDEX, TROPONINI in the last 168 hours.  BNP (last 3 results) No results for input(s): BNP in the last 8760 hours.  ProBNP (last 3 results) No results for input(s): PROBNP in the last 8760 hours.  CBG: No results for input(s): GLUCAP in the last 168 hours.  Radiological Exams on Admission: Ct Abdomen Pelvis W Contrast  05/27/2015   CLINICAL DATA:  Diffuse abdominal pain for 1 week.  Abnormal LFTs.  EXAM: CT ABDOMEN AND PELVIS WITH CONTRAST  TECHNIQUE: Multidetector CT imaging of the abdomen and pelvis was performed using the standard protocol following bolus administration of intravenous contrast.  CONTRAST:  OMNIPAQUE IOHEXOL 300 MG/ML  SOLN  COMPARISON:  None.  FINDINGS: Lower chest:  Unremarkable  Hepatobiliary: The liver is unremarkable. There is wall thickening and enhancement of the common bile duct and main/left/right hepatic ducts, compatible with cholangitis. There is no evidence of biliary dilatation.  Pancreas: There is mild inflammation along the pancreatic body/tail. The pancreas is unremarkable. No pancreatic necrosis, venous thrombosis, hemorrhage or acute fluid collections identified.  Spleen: Unremarkable  Adrenals/Urinary Tract: The kidneys and adrenal glands are unremarkable. Mild circumferential bladder wall thickening is noted.  Stomach/Bowel: There is no evidence of bowel obstruction or focal bowel wall thickening.  Vascular/Lymphatic: No enlarged lymph nodes or abdominal aortic aneurysm. Aortic atherosclerotic calcifications are noted.  Reproductive: Prostate unremarkable.  Other: No free fluid, abscess or pneumoperitoneum.  Musculoskeletal: No acute or suspicious abnormalities moderate degenerative disc disease at L5-S1 identified.  IMPRESSION: Mild peripancreatic inflammation  compatible with pancreatitis - correlate with laboratory values. No evidence of pancreatic necrosis, hemorrhage, venous thrombosis or acute fluid collections.  Enhancement of the CBD and main hepatic ducts compatible with cholangitis. No evidence of biliary dilatation.  Aortic atherosclerotic disease.   Electronically Signed   By: Harmon Pier M.D.   On: 05/27/2015 18:52   Dg Abd Acute W/chest  05/26/2015   CLINICAL DATA:  Abdominal discomfort for 5 days, cramping, constipation  EXAM: DG ABDOMEN ACUTE W/ 1V CHEST  COMPARISON:  None.  FINDINGS: No active infiltrate or effusion is seen. Mediastinal and hilar contours are unremarkable. The heart is within normal limits in size. No bony abnormality is seen.  Supine erect views of the abdomen show no bowel obstruction. No free air is seen on the erect view. No opaque calculi are noted. There are degenerative changes in the lower lumbar spine.  IMPRESSION: 1. No active lung disease. 2. No bowel obstruction.  No free air. 3. No opaque calculi are noted.   Electronically Signed   By: Dwyane Dee M.D.   On: 05/26/2015 14:44   US Abdomen Limited Ruq  05/27/2015   CLINICAL DATA:  Pancreatitis and cholangitis by CT  EXAM: US ABDOMEN LIMITED - RIGHT UPPER QUADRANT  COMPARISON:  CT 05/27/2015  FINDINGS: Gallbladder:  The gallbladder is contracted around multiple calculi. There is no pericholecystic fluid.  Common bile duct:  Diameter: 5.9 mm  Liver:  Mildly coarsened echotexture without discrete lesion.  IMPRESSION: Cholelithiasis with contracted gallbladder. No pericholecystic fluid. No tenderness over the gallbladder. No bile duct dilatation.   Electronically Signed   By: Ellery Plunk M.D.   On: 05/27/2015 22:26    EKG: Not done in ED, will get one.   Assessment/Plan Principal Problem:   Abdominal pain Active Problems:   HYPERTRIGLYCERIDEMIA   Paranoid schizophrenia   Essential hypertension, benign   GERD   Pancreatitis  Abdominal pain: CT abd/pelvis  showed pancreatitis and cholangitis, no evidence of biliary dilatation. Lipase is elevated at 195. Patient is not septic, hemodynamically stable. GI and surgery were consulted  -will admit to med-surg bed -Follow up with GI and the surgeon's recommendation -US-abd to further evaluation for biliary system and possible stone -Hepatitis panel -HIV Ab -When necessary morphine for pain and Zofran for nausea -IV antibodies: Patient received one dose of Flagyl and Rocephin. Since patient is allergic to penicillin, switched to Flagyl plus levofloxacin  HYPERTRIGLYCERIDEMIA: Used to be on gemfibrozil, but currently not taking his medications. TG was 217 on 05/26/15. -will restart gemfibrozil  Paranoid schizophrenia: Stable, no suicidal or homicidal ideations. -Monthly holdol injection, last dose was 2 weeks  ago  HTN: -Lisinopril  GERD: -pepcid   DVT ppx: SCD Code Status: Full code Family Communication: Yes, patient's sister and niece  at bed side Disposition Plan: Admit to inpatient   Date of Service 05/28/2015    Lorretta Harp [facility] Pager [phone]  If 7PM-7AM, please contact night-coverage [email or web address] Password [email or web address] 05/28/2015, 12:18 AM

## 2015-05-28 ENCOUNTER — Encounter (HOSPITAL_COMMUNITY): Payer: Self-pay | Admitting: Internal Medicine

## 2015-05-28 DIAGNOSIS — K8001 Calculus of gallbladder with acute cholecystitis with obstruction: Secondary | ICD-10-CM

## 2015-05-28 DIAGNOSIS — K8020 Calculus of gallbladder without cholecystitis without obstruction: Secondary | ICD-10-CM | POA: Diagnosis present

## 2015-05-28 DIAGNOSIS — K851 Biliary acute pancreatitis: Principal | ICD-10-CM

## 2015-05-28 DIAGNOSIS — K859 Acute pancreatitis without necrosis or infection, unspecified: Secondary | ICD-10-CM | POA: Diagnosis present

## 2015-05-28 DIAGNOSIS — R7989 Other specified abnormal findings of blood chemistry: Secondary | ICD-10-CM

## 2015-05-28 LAB — HIV ANTIBODY (ROUTINE TESTING W REFLEX): HIV Screen 4th Generation wRfx: NONREACTIVE

## 2015-05-28 LAB — COMPREHENSIVE METABOLIC PANEL
ALT: 96 U/L — ABNORMAL HIGH (ref 17–63)
AST: 45 U/L — ABNORMAL HIGH (ref 15–41)
Albumin: 3.2 g/dL — ABNORMAL LOW (ref 3.5–5.0)
Alkaline Phosphatase: 442 U/L — ABNORMAL HIGH (ref 38–126)
Anion gap: 9 (ref 5–15)
BUN: 6 mg/dL (ref 6–20)
CO2: 22 mmol/L (ref 22–32)
Calcium: 8.9 mg/dL (ref 8.9–10.3)
Chloride: 107 mmol/L (ref 101–111)
Creatinine, Ser: 0.84 mg/dL (ref 0.61–1.24)
GFR calc Af Amer: >60 mL/min (ref >60)
GFR calc non Af Amer: >60 mL/min (ref >60)
Glucose, Bld: 122 mg/dL — ABNORMAL HIGH (ref 65–99)
Potassium: 4 mmol/L (ref 3.5–5.1)
Sodium: 138 mmol/L (ref 135–145)
Total Bilirubin: 1.7 mg/dL — ABNORMAL HIGH (ref 0.3–1.2)
Total Protein: 7.3 g/dL (ref 6.5–8.1)

## 2015-05-28 LAB — TYPE AND SCREEN
ABO/RH(D): O NEG
Antibody Screen: NEGATIVE

## 2015-05-28 LAB — GLUCOSE, CAPILLARY: Glucose-Capillary: 108 mg/dL — ABNORMAL HIGH (ref 65–99)

## 2015-05-28 LAB — CBC
HCT: 41.1 % (ref 39.0–52.0)
Hemoglobin: 14.4 g/dL (ref 13.0–17.0)
MCH: 32.7 pg (ref 26.0–34.0)
MCHC: 35 g/dL (ref 30.0–36.0)
MCV: 93.2 fL (ref 78.0–100.0)
Platelets: 335 10*3/uL (ref 150–400)
RBC: 4.41 MIL/uL (ref 4.22–5.81)
RDW: 13.9 % (ref 11.5–15.5)
WBC: 8.7 10*3/uL (ref 4.0–10.5)

## 2015-05-28 LAB — PROTIME-INR
INR: 0.99 (ref 0.00–1.49)
Prothrombin Time: 13.3 seconds (ref 11.6–15.2)

## 2015-05-28 LAB — APTT: aPTT: 33 seconds (ref 24–37)

## 2015-05-28 LAB — HEPATITIS PANEL, ACUTE
HCV Ab: NEGATIVE
Hep A IgM: NONREACTIVE
Hep B C IgM: NONREACTIVE
Hepatitis B Surface Ag: NEGATIVE

## 2015-05-28 LAB — LIPASE: Lipase: 423 U/L — ABNORMAL HIGH (ref 0–75)

## 2015-05-28 LAB — ABO/RH: ABO/RH(D): O NEG

## 2015-05-28 MED ORDER — MORPHINE SULFATE 2 MG/ML IJ SOLN: 2.0000 mg | INTRAMUSCULAR | Status: DC | PRN

## 2015-05-28 MED ORDER — GEMFIBROZIL 600 MG PO TABS
600.0000 mg | ORAL_TABLET | Freq: Two times a day (BID) | ORAL | Status: DC
  Filled 2015-05-28 (×11): qty 1

## 2015-05-28 MED ORDER — ALUM & MAG HYDROXIDE-SIMETH 200-200-20 MG/5ML PO SUSP: 30.0000 mL | Freq: Four times a day (QID) | ORAL | Status: DC | PRN

## 2015-05-28 MED ORDER — SODIUM CHLORIDE 0.9 % IV BOLUS (SEPSIS)
1000.0000 mL | Freq: Once | INTRAVENOUS | Status: AC
  Administered 2015-05-28: 1000 mL via INTRAVENOUS

## 2015-05-28 MED ORDER — LEVOFLOXACIN IN D5W 500 MG/100ML IV SOLN
500.0000 mg | INTRAVENOUS | Status: DC
  Administered 2015-05-28 – 2015-05-30 (×3): 500 mg via INTRAVENOUS
  Filled 2015-05-28 (×3): qty 100

## 2015-05-28 MED ORDER — LISINOPRIL 10 MG PO TABS
10.0000 mg | ORAL_TABLET | Freq: Every day | ORAL | Status: DC
  Administered 2015-05-28 – 2015-06-01 (×5): 10 mg via ORAL
  Filled 2015-05-28 (×5): qty 1

## 2015-05-28 MED ORDER — FAMOTIDINE 20 MG PO TABS
20.0000 mg | ORAL_TABLET | Freq: Two times a day (BID) | ORAL | Status: DC
  Administered 2015-05-28 – 2015-06-01 (×9): 20 mg via ORAL
  Filled 2015-05-28 (×11): qty 1

## 2015-05-28 MED ORDER — ONDANSETRON HCL 4 MG/2ML IJ SOLN
4.0000 mg | Freq: Three times a day (TID) | INTRAMUSCULAR | Status: DC | PRN
Start: 2015-05-28 — End: 2015-06-01

## 2015-05-28 MED ORDER — METRONIDAZOLE IN NACL 5-0.79 MG/ML-% IV SOLN
500.0000 mg | Freq: Three times a day (TID) | INTRAVENOUS | Status: DC
  Administered 2015-05-28 – 2015-05-30 (×8): 500 mg via INTRAVENOUS
  Filled 2015-05-28 (×9): qty 100

## 2015-05-28 MED ORDER — SODIUM CHLORIDE 0.9 % IV SOLN
INTRAVENOUS | Status: DC
  Administered 2015-05-28 – 2015-05-30 (×4): via INTRAVENOUS

## 2015-05-28 NOTE — Consult Note (Signed)
Gastroenterology Consult                                                                  Covering for Dr. Elnoria Howard  Referring Provider: [facility] Primary Care Physician:  Tereso Newcomer, PA-C Primary Gastroenterologist:  Dr. Jeani Hawking  Reason for Consultation:    Pancreatitis/ cholangitis  HPI: Travis Lawrence is a 59 y.o. male  who was followed medically at [facility] He has past medical history of schizophrenia, hypertension, hyperlipidemia, and GERD. He reports that he had an upper endoscopy by Dr. Juanda Chance in 2008 and followed by Dr. Elnoria Howard since 2011. He states he had a colonoscopy 5 years ago by Dr. Elnoria Howard at which time a polyp was removed and he says he is due for surveillance colonoscopy this year.  Pt was evaluated by Dr. Conley Rolls on July 28 with complaints of a 5 day history of abdominal pain. He says he was feeling well up until that time but developed rather acute onset of abdominal pain. Pain was in the periumbilical and epigastric area. Initially he thought he might be constipated and so he took a laxative but had no relief. His pain was fairly constant and he rates it as a 7 or 8 on a scale of 1-10. It was not alleviated or exacerbated with ingestion of food, however he states his appetite was minimal. He felt nauseous and vomited twice. On the day prior to admission he states he was having chills. He was sent for a CT scan and lab work, and was found to have elevated liver enzymes and his CT showed evidence of pancreatitis and cholangitis, and he was referred to the ER. In the ER he was found to have a white blood cell count of 9.7, total bili 2.5, alkaline phosphatase 583, AST 61, ALT 129, lipase 125. This morning he states his pain has subsided and he feels much better.   Past Medical History  Diagnosis Date  . GERD (gastroesophageal reflux disease)   . Hypertension   . Schizophrenia   .  Hypertriglyceridemia     Past Surgical History  Procedure Laterality Date  . Tonsillectomy      Prior to Admission medications   Medication Sig Start Date End Date Taking? Authorizing Provider  alum & mag hydroxide-simeth (MAALOX/MYLANTA) 200-200-20 MG/5ML suspension Take 30 mLs by mouth every 6 (six) hours as needed for indigestion or heartburn.   Yes Historical Provider, MD  famotidine (PEPCID) 20 MG tablet Take 1 tablet (20 mg total) by mouth 2 (two) times daily. 05/26/15  Yes Thao P Le, DO  haloperidol decanoate (HALDOL DECANOATE) 100 MG/ML injection Inject 100 mg into the muscle every 28 (twenty-eight) days.   Yes Historical Provider, MD  ibuprofen (ADVIL,MOTRIN) 200 MG tablet Take 400 mg by mouth every 6 (six) hours as needed for moderate pain (stomach pain).   Yes Historical Provider, MD  lisinopril (PRINIVIL,ZESTRIL) 10 MG tablet Take 1 tablet (10 mg total) by mouth daily. NO MORE REFILLS WITHOUT OFFICE VISIT - 2ND NOTICE 05/26/15  Yes Thao P Le, DO  fluticasone (FLONASE) 50 MCG/ACT nasal spray Place 2 sprays into both nostrils daily. Patient not taking: Reported on 05/27/2015 04/21/14   Peyton Najjar, MD  gemfibrozil (LOPID) 600 MG tablet TAKE 1 TABLET BY MOUTH TWICE A DAY BEFORE A MEAL Patient not taking: Reported on 05/27/2015 05/26/15   Thao P Le, DO  zoster vaccine live, PF, (ZOSTAVAX) 16109 UNT/0.65ML injection Inject 19,400 Units into the skin once. Patient not taking: Reported on 05/26/2015 04/21/14   Peyton Najjar, MD    Current Facility-Administered Medications  Medication Dose Route Frequency Provider Last Rate Last Dose  . 0.9 %  sodium chloride infusion   Intravenous Continuous Lorretta Harp, MD 100 mL/hr at 05/28/15 0240    . alum & mag hydroxide-simeth (MAALOX/MYLANTA) 200-200-20 MG/5ML suspension 30 mL  30 mL Oral Q6H PRN Lorretta Harp, MD      . famotidine (PEPCID) tablet 20 mg  20 mg Oral BID Lorretta Harp, MD   20 mg at 05/28/15 0030  . gemfibrozil (LOPID) tablet 600 mg  600 mg  Oral BID AC Lorretta Harp, MD      . levofloxacin (LEVAQUIN) IVPB 500 mg  500 mg Intravenous Q24H Lorretta Harp, MD      . lisinopril (PRINIVIL,ZESTRIL) tablet 10 mg  10 mg Oral Daily Lorretta Harp, MD      . metroNIDAZOLE (FLAGYL) IVPB 500 mg  500 mg Intravenous Q8H Lorretta Harp, MD   500 mg at 05/28/15 0545  . morphine 2 MG/ML injection 2 mg  2 mg Intravenous Q4H PRN Lorretta Harp, MD      . ondansetron [facility]) injection 4 mg  4 mg Intravenous Q8H PRN Lorretta Harp, MD        Allergies as of 05/27/2015 - Review Complete 05/27/2015  Allergen Reaction Noted  . Penicillins      Family History  Problem Relation Age of Onset  . Cancer Mother     Throat cancer  . Lung cancer Father   . Lung cancer Sister     History   Social History  . Marital Status: Single    Spouse Name: N/A  . Number of Children: N/A  . Years of Education: N/A   Occupational History  . Not on file.   Social History Main Topics  . Smoking status: Former Smoker -- 1.00 packs/day    Types: Cigars  . Smokeless tobacco: Never Used  . Alcohol Use: No  . Drug Use: No  . Sexual Activity: Yes   Other Topics Concern  . Not on file   Social History Narrative    Review of Systems: Gen: Denies any fever, chills, sweats. Had anorexia and fatigue. CV: Denies chest pain, angina, palpitations, syncope, orthopnea, PND, peripheral edema, and claudication. Resp: Denies dyspnea at rest, dyspnea with exercise, cough, sputum, wheezing, coughing up blood, and pleurisy. GI: Had abdominal pain and vomiting but states he feels better now GU : Denies urinary burning, blood in urine, urinary frequency, urinary hesitancy, nocturnal urination, and urinary incontinence. MS: Denies joint pain, limitation of movement, and swelling, stiffness, low back pain, extremity pain. Denies muscle weakness, cramps, atrophy.  Derm: Denies rash, itching, dry skin, hives, moles, warts, or unhealing ulcers.  Psych: Has a history of schizophrenia Heme: Denies  bruising, bleeding, and enlarged lymph nodes. Neuro:  Denies any headaches, dizziness, paresthesias. Endo:  Denies any problems with DM, thyroid, adrenal function.  Physical Exam: Vital signs in last 24 hours: Temp:  [97.8 F (36.6 C)-98.1 F (36.7 C)] 98.1 F (36.7 C) (07/30 0427) Pulse Rate:  [80-85] 85 (07/30 0427) Resp:  [18] 18 (07/30 0427) BP: (125-134)/(84-89) 128/89 mmHg (07/30 0427) SpO2:  [95 %-  97 %] 97 % (07/30 0427) Weight:  [163 lb 9.6 oz (74.208 kg)-170 lb (77.111 kg)] 163 lb 9.6 oz (74.208 kg) (07/30 0023) Last BM Date: 05/27/15 General:   Alert,  Well-developed, well-nourished, pleasant and cooperative in NAD Head:  Normocephalic and atraumatic. Eyes:  Sclera clear, no icterus. Conjunctiva pink. Ears:  Normal auditory acuity. Nose:  No deformity, discharge,  or lesions. Mouth:  No deformity or lesions.   Neck:  Supple; no masses or thyromegaly. Lungs:  Clear throughout to auscultation.    Heart:  Regular rate and rhythm; no murmurs, clicks, rubs,  or gallops. Abdomen:  Soft,nontender, BS active,nonpalp mass or hsm.   Rectal:  Deferred  Msk:  Symmetrical without gross deformities. . Pulses:  Normal pulses noted. Extremities: Without clubbing or edema. Neurologic: Alert and  oriented x4;  grossly normal neurologically. Skin:  Intact without significant lesions or rashes.. Psych:  Alert and cooperative. Normal mood and affect.  Intake/Output from previous day: 07/29 0701 - 07/30 0700 In: -  Out: 300 [Urine:300] Intake/Output this shift: Total I/O In: -  Out: 400 [Urine:400]  Lab Results:  Recent Labs  05/26/15 1403 05/28/15 0500  WBC 9.7 8.7  HGB 15.2 14.4  HCT 44.3 41.1  PLT  --  335   BMET  Recent Labs  05/26/15 1339 05/28/15 0100  NA 136 138  K 3.7 4.0  CL 102 107  CO2 22 22  GLUCOSE 91 122*  BUN 12 6  CREATININE 0.77 0.84  CALCIUM 9.2 8.9   LFT  Recent Labs  05/28/15 0100  PROT 7.3  ALBUMIN 3.2*  AST 45*  ALT 96*  ALKPHOS  442*  BILITOT 1.7*   Complete metabolic panel on 05/26/2015 had total bili 2.5, alkaline phosphatase 583, AST 61, ALT 129.  PT/INR  Recent Labs  05/28/15 0100  LABPROT 13.3  INR 0.99   Hepatitis Panel  Recent Labs  05/26/15 1339  HEPBSAG NEGATIVE  HCVAB NEGATIVE  HEPAIGM NON REACTIVE  HEPBIGM NON REACTIVE      Studies/Results: Ct Abdomen Pelvis W Contrast  05/27/2015   CLINICAL DATA:  Diffuse abdominal pain for 1 week.  Abnormal LFTs.  EXAM: CT ABDOMEN AND PELVIS WITH CONTRAST  TECHNIQUE: Multidetector CT imaging of the abdomen and pelvis was performed using the standard protocol following bolus administration of intravenous contrast.  CONTRAST:  OMNIPAQUE IOHEXOL 300 MG/ML  SOLN  COMPARISON:  None.  FINDINGS: Lower chest:  Unremarkable  Hepatobiliary: The liver is unremarkable. There is wall thickening and enhancement of the common bile duct and main/left/right hepatic ducts, compatible with cholangitis. There is no evidence of biliary dilatation.  Pancreas: There is mild inflammation along the pancreatic body/tail. The pancreas is unremarkable. No pancreatic necrosis, venous thrombosis, hemorrhage or acute fluid collections identified.  Spleen: Unremarkable  Adrenals/Urinary Tract: The kidneys and adrenal glands are unremarkable. Mild circumferential bladder wall thickening is noted.  Stomach/Bowel: There is no evidence of bowel obstruction or focal bowel wall thickening.  Vascular/Lymphatic: No enlarged lymph nodes or abdominal aortic aneurysm. Aortic atherosclerotic calcifications are noted.  Reproductive: Prostate unremarkable.  Other: No free fluid, abscess or pneumoperitoneum.  Musculoskeletal: No acute or suspicious abnormalities moderate degenerative disc disease at L5-S1 identified.  IMPRESSION: Mild peripancreatic inflammation compatible with pancreatitis - correlate with laboratory values. No evidence of pancreatic necrosis, hemorrhage, venous thrombosis or acute fluid  collections.  Enhancement of the CBD and main hepatic ducts compatible with cholangitis. No evidence of biliary dilatation.  Aortic atherosclerotic disease.  Electronically Signed   By: Harmon Pier M.D.   On: 05/27/2015 18:52   Dg Abd Acute W/chest  05/26/2015   CLINICAL DATA:  Abdominal discomfort for 5 days, cramping, constipation  EXAM: DG ABDOMEN ACUTE W/ 1V CHEST  COMPARISON:  None.  FINDINGS: No active infiltrate or effusion is seen. Mediastinal and hilar contours are unremarkable. The heart is within normal limits in size. No bony abnormality is seen.  Supine erect views of the abdomen show no bowel obstruction. No free air is seen on the erect view. No opaque calculi are noted. There are degenerative changes in the lower lumbar spine.  IMPRESSION: 1. No active lung disease. 2. No bowel obstruction.  No free air. 3. No opaque calculi are noted.   Electronically Signed   By: Dwyane Dee M.D.   On: 05/26/2015 14:44   US Abdomen Limited Ruq  05/27/2015   CLINICAL DATA:  Pancreatitis and cholangitis by CT  EXAM: US ABDOMEN LIMITED - RIGHT UPPER QUADRANT  COMPARISON:  CT 05/27/2015  FINDINGS: Gallbladder:  The gallbladder is contracted around multiple calculi. There is no pericholecystic fluid.  Common bile duct:  Diameter: 5.9 mm  Liver:  Mildly coarsened echotexture without discrete lesion.  IMPRESSION: Cholelithiasis with contracted gallbladder. No pericholecystic fluid. No tenderness over the gallbladder. No bile duct dilatation.   Electronically Signed   By: Ellery Plunk M.D.   On: 05/27/2015 22:26    IMPRESSION/PLAN: 59 year old male with a history of hypertriglyceridemia, hypertension, GERD, schizophrenia, admitted with abdominal pain. CT revealed pancreatitis and enhancement of the common bile duct and main hepatic ducts compatible with cholangitis. Ultrasound showed cholelithiasis with contracted gallbladder, no bile duct dilatation. LFTs improving. White blood count normal. Continue IV  fluids. Keep NPO. Currently on Levaquin and Flagyl. Trend LFTs. If LFTs continue to improve may need cholecystectomy/IOC. Will await surgical opinion. Will review with attending as to rule of possible ERCP preop. Dr. Elnoria Howard to resume care on Monday.     Hvozdovic, Moise Boring 05/28/2015  Pager [phone]     Attending physician's note   I have taken a history, examined the patient and reviewed the chart. I agree with the Advanced Practitioner's note, impression and recommendations. Biliary pancreatitis with finding of cholangitis on CT. Cholelithiasis on [place] and CBD=5.32mm. He does not have typical signs and symptoms of cholangitis. Bowel rest and IV antibiotics for now. Cholecystectomy and IOC probably best course for mgmt with LFTs improving and CBD not dilated. Consider pre op ERCP but would favor post op ERCP if IOC shows stones. Dr. Elnoria Howard to resume care on Monday.   Meryl Dare, MD Clementeen Graham [phone] pager Mon-Fri 8a-5p [phone] weekends, holidays and 5p-8a or per [facility]

## 2015-05-28 NOTE — Consult Note (Signed)
Re:   Travis Lawrence DOB:   Jan 04, 1956 MRN:   [id number]  Consult note - [facility]  ASSESSMENT AND PLAN: 1.  Cholangitis -  By CT scan   He clinically has a benign abdomen  WBC - 8,700  Elevated, but improving LFT's  On Levaquin and Flagyl  2.  Gall stones by [place]  I discussed possible cholecystectomy during this admission.  Probably not tomorrow, maybe Monday.  3.  History of schizophrenia  Gets a shot once a month - at [facility])  4.  HTN 5.  Hyperlipidemia 6.  GERD 7.  Recently quit smoking - 7 days ago  Chief Complaint  Patient presents with  . Pancreatitis   REFERRING PHYSICIAN: Richardson Dopp, PA-C  HISTORY OF PRESENT ILLNESS: Travis Lawrence is a 59 y.o. (DOB: 1956/05/06)  white  male whose primary care physician is Richardson Dopp, PA-C.  [name], [facility]). Sister, [name], in room with him    He had had a 6 month history of abdominal pain.  He would have dry heaves at times.  He thought some of his symptoms were related to the GERD.   He had a prior upper endo by Dr. Olevia Perches about 2008.  Started GERD meds at that time.  Had colonoscopy by Dr. Benson Norway about 2011.  He has no liver, pancreas, or colon problems. He's had no prior abdominal surgery.  His sister and mother had gall bladder disease.  CT abdomen - 05/27/2015 - 1) Mild peripancreatic inflammation compatible with pancreatitis - correlate with laboratory values. No evidence of pancreatic necrosis, hemorrhage, venous thrombosis or acute fluid collections.  2) Enhancement of the CBD and main hepatic ducts compatible with cholangitis. 3) No evidence of biliary dilatation.  [place] of abdomen - Cholelithiasis with contracted gallbladder. No pericholecystic fluid.   Liver functions - 05/28/2015 - AST - 45, ALT - 96, Alk phos - 442, T Bili - 1.7 (these are better than 05/26/2015)  Aortic atherosclerotic disease.    Past Medical History  Diagnosis Date  . GERD (gastroesophageal reflux  disease)   . Hypertension   . Schizophrenia   . Hypertriglyceridemia       Past Surgical History  Procedure Laterality Date  . Tonsillectomy        Current Facility-Administered Medications  Medication Dose Route Frequency Provider Last Rate Last Dose  . 0.9 %  sodium chloride infusion   Intravenous Continuous Ivor Costa, MD 100 mL/hr at 05/28/15 0240    . alum & mag hydroxide-simeth (MAALOX/MYLANTA) 200-200-20 MG/5ML suspension 30 mL  30 mL Oral Q6H PRN Ivor Costa, MD      . famotidine (PEPCID) tablet 20 mg  20 mg Oral BID Ivor Costa, MD   20 mg at 05/28/15 0820  . gemfibrozil (LOPID) tablet 600 mg  600 mg Oral BID AC Ivor Costa, MD      . levofloxacin (LEVAQUIN) IVPB 500 mg  500 mg Intravenous Q24H Ivor Costa, MD   500 mg at 05/28/15 0818  . lisinopril (PRINIVIL,ZESTRIL) tablet 10 mg  10 mg Oral Daily Ivor Costa, MD      . metroNIDAZOLE (FLAGYL) IVPB 500 mg  500 mg Intravenous Q8H Ivor Costa, MD   500 mg at 05/28/15 0545  . morphine 2 MG/ML injection 2 mg  2 mg Intravenous Q4H PRN Ivor Costa, MD      . ondansetron [facility]) injection 4 mg  4 mg Intravenous Q8H PRN Ivor Costa, MD  Allergies  Allergen Reactions  . Penicillins     REACTION: rash    REVIEW OF SYSTEMS: Skin:  No history of rash.  No history of abnormal moles. Infection:  No history of hepatitis or HIV.  No history of MRSA. Neurologic:  No history of stroke.  No history of seizure.  No history of headaches. Cardiac:  HTN.   No history of heart disease.  No history of prior cardiac catheterization.  No history of seeing a cardiologist. Pulmonary:  Just quit smoking  Endocrine:  No diabetes. No thyroid disease. Gastrointestinal:  See HPI Urologic:  No history of kidney stones.  No history of bladder infections. Musculoskeletal:  No history of joint or back disease. Hematologic:  No bleeding disorder.  No history of anemia.  Not anticoagulated. Psycho-social:  History of schizophrenia  SOCIAL and FAMILY  HISTORY: Unmarried. No children. Sister, [name], in room with him  PHYSICAL EXAM: BP 128/89 mmHg  Pulse 85  Temp(Src) 98.1 F (36.7 C) (Oral)  Resp 18  Ht _0  (1.803 m)  Wt 74.208 kg (163 lb 9.6 oz)  BMI 22.83 kg/m2  SpO2 97%  General: WN WM who is alert and generally healthy appearing.  HEENT: Normal. Pupils equal. Neck: Supple. No mass.  No thyroid mass. Lymph Nodes:  No supraclavicular or cervical nodes. Lungs: Clear to auscultation and symmetric breath sounds. Heart:  RRR. No murmur or rub.  Abdomen: Soft. No mass. No tenderness. No hernia. Normal bowel sounds.  No abdominal scars.  Extremities:  Good strength and ROM  in upper and lower extremities. Neurologic:  Grossly intact to motor and sensory function. Psychiatric: . Behavior is normal.   DATA REVIEWED: Epic notes  Alphonsa Overall, MD,  [facility], [place]  [place], [place]    [place] Phone:  [place]:  [phone]

## 2015-05-28 NOTE — Progress Notes (Signed)
OT Cancellation Note  Patient Details Name: MIRAN KAUTZMAN MRN: [id number] DOB: [birth year]   Cancelled Treatment:    Reason Eval/Treat Not Completed: OT screened, no needs identified, will sign off  Hektor Huston, [place]ung 05/28/2015, 1:27 PM

## 2015-05-28 NOTE — Progress Notes (Signed)
Progress Note   ELIBERTO SOLE Lawrence:[id number] DOB: Jan 11, 1956 DOA: 05/27/2015 PCP: Tereso Newcomer, PA-C   Brief Narrative:   Travis Lawrence is an 59 y.o. male the PMH of hypertension, hypertriglyceridemia, GERD and schizophrenia who was admitted 05/27/15 with a chief complaint of a 4-5 day history of abdominal pain. He was evaluated at an urgent care center and had a CT scan of the abdomen which showed evidence of pancreatitis and cholangitis. His liver enzymes were elevated and he subsequently was referred to the ER for further evaluation.  Assessment/Plan:   Principal Problem:   Pancreatitis, acute / cholangitis / cholelithiasis with abdominal pain - CT done 05/27/15: Mild peripancreatic inflammation compatible with pancreatitis. Lipase elevated. - CT also showed: Enhancement of the CBD and main hepatic ducts compatible with cholangitis. - Abdominal ultrasound showed cholelithiasis in a contracted gallbladder. - GI and surgery consulted. For possible ERCP and cholecystectomy. - Continue empiric level floxacillin and Flagyl for treatment of cholangitis. - Follow-up HIV and hepatitis serologies.  Active Problems:   HYPERTRIGLYCERIDEMIA - Triglycerides were 217 on 05/26/15. Continue gemfibrozil.    Paranoid schizophrenia - Receives monthly Haldol injections, last dose approximately 2 weeks ago.    Essential hypertension, benign - Continue lisinopril.    GERD - Continue Pepcid.    DVT Prophylaxis - SCDs ordered.  Family Communication: Sister updated at the bedside. Disposition Plan: Home when stable, likely several more days pending cholecystectomy. Code Status:     Code Status Orders        Start     Ordered   05/28/15 0028  Full code   Continuous     05/28/15 0028        IV Access:    Peripheral IV   Procedures and diagnostic studies:   Ct Abdomen Pelvis W Contrast  05/27/2015   CLINICAL DATA:  Diffuse abdominal pain for 1 week.  Abnormal LFTs.   EXAM: CT ABDOMEN AND PELVIS WITH CONTRAST  TECHNIQUE: Multidetector CT imaging of the abdomen and pelvis was performed using the standard protocol following bolus administration of intravenous contrast.  CONTRAST:  OMNIPAQUE IOHEXOL 300 MG/ML  SOLN  COMPARISON:  None.  FINDINGS: Lower chest:  Unremarkable  Hepatobiliary: The liver is unremarkable. There is wall thickening and enhancement of the common bile duct and main/left/right hepatic ducts, compatible with cholangitis. There is no evidence of biliary dilatation.  Pancreas: There is mild inflammation along the pancreatic body/tail. The pancreas is unremarkable. No pancreatic necrosis, venous thrombosis, hemorrhage or acute fluid collections identified.  Spleen: Unremarkable  Adrenals/Urinary Tract: The kidneys and adrenal glands are unremarkable. Mild circumferential bladder wall thickening is noted.  Stomach/Bowel: There is no evidence of bowel obstruction or focal bowel wall thickening.  Vascular/Lymphatic: No enlarged lymph nodes or abdominal aortic aneurysm. Aortic atherosclerotic calcifications are noted.  Reproductive: Prostate unremarkable.  Other: No free fluid, abscess or pneumoperitoneum.  Musculoskeletal: No acute or suspicious abnormalities moderate degenerative disc disease at L5-S1 identified.  IMPRESSION: Mild peripancreatic inflammation compatible with pancreatitis - correlate with laboratory values. No evidence of pancreatic necrosis, hemorrhage, venous thrombosis or acute fluid collections.  Enhancement of the CBD and main hepatic ducts compatible with cholangitis. No evidence of biliary dilatation.  Aortic atherosclerotic disease.   Electronically Signed   By: Harmon Pier M.D.   On: 05/27/2015 18:52   Dg Abd Acute W/chest  05/26/2015   CLINICAL DATA:  Abdominal discomfort for 5 days, cramping, constipation  EXAM: DG ABDOMEN ACUTE W/  1V CHEST  COMPARISON:  None.  FINDINGS: No active infiltrate or effusion is seen. Mediastinal and hilar  contours are unremarkable. The heart is within normal limits in size. No bony abnormality is seen.  Supine erect views of the abdomen show no bowel obstruction. No free air is seen on the erect view. No opaque calculi are noted. There are degenerative changes in the lower lumbar spine.  IMPRESSION: 1. No active lung disease. 2. No bowel obstruction.  No free air. 3. No opaque calculi are noted.   Electronically Signed   By: Dwyane Dee M.D.   On: 05/26/2015 14:44   US Abdomen Limited Ruq  05/27/2015   CLINICAL DATA:  Pancreatitis and cholangitis by CT  EXAM: US ABDOMEN LIMITED - RIGHT UPPER QUADRANT  COMPARISON:  CT 05/27/2015  FINDINGS: Gallbladder:  The gallbladder is contracted around multiple calculi. There is no pericholecystic fluid.  Common bile duct:  Diameter: 5.9 mm  Liver:  Mildly coarsened echotexture without discrete lesion.  IMPRESSION: Cholelithiasis with contracted gallbladder. No pericholecystic fluid. No tenderness over the gallbladder. No bile duct dilatation.   Electronically Signed   By: Ellery Plunk M.D.   On: 05/27/2015 22:26     Medical Consultants:    Gastroenterology  General Surgery  Anti-Infectives:    Levofloxacin 05/27/15--->  Flagyl 05/27/15--->  Subjective:   Travis Lawrence denies current abdominal pain.  No nausea or vomiting. No dyspnea.  Objective:    Filed Vitals:   05/27/15 2014 05/27/15 2221 05/28/15 0023 05/28/15 0427  BP: 130/86 125/86 134/84 128/89  Pulse: 81 80 82 85  Temp: 97.8 F (36.6 C)  97.8 F (36.6 C) 98.1 F (36.7 C)  TempSrc: Oral  Oral Oral  Resp: 18 18 18 18   Height: 5\' 11"  (1.803 m)  5\' 11"  (1.803 m)   Weight: 77.111 kg (170 lb)  74.208 kg (163 lb 9.6 oz)   SpO2: 95% 97% 97% 97%    Intake/Output Summary (Last 24 hours) at 05/28/15 0812 Last data filed at 05/28/15 0710  Gross per 24 hour  Intake      0 ml  Output    700 ml  Net   -700 ml    Exam: Gen:  NAD Cardiovascular:  RRR, No M/R/G Respiratory:  Lungs  CTAB Gastrointestinal:  Abdomen soft, NT/ND, + BS Extremities:  No C/E/C   Data Reviewed:    Labs: Basic Metabolic Panel:  Recent Labs Lab 05/26/15 1339 05/28/15 0100  NA 136 138  K 3.7 4.0  CL 102 107  CO2 22 22  GLUCOSE 91 122*  BUN 12 6  CREATININE 0.77 0.84  CALCIUM 9.2 8.9   GFR Estimated Creatinine Clearance: 99.4 mL/min (by C-G formula based on Cr of 0.84). Liver Function Tests:  Recent Labs Lab 05/26/15 1339 05/28/15 0100  AST 61* 45*  ALT 129* 96*  ALKPHOS 583* 442*  BILITOT 2.5* 1.7*  PROT 7.0 7.3  ALBUMIN 3.9 3.2*    Recent Labs Lab 05/26/15 1339 05/27/15 2117  LIPASE 423* 195*   Coagulation profile  Recent Labs Lab 05/28/15 0100  INR 0.99    CBC:  Recent Labs Lab 05/26/15 1403 05/28/15 0500  WBC 9.7 8.7  HGB 15.2 14.4  HCT 44.3 41.1  MCV 94.0 93.2  PLT  --  335   CBG:  Recent Labs Lab 05/28/15 0726  GLUCAP 108*   Lipid Profile:  Recent Labs  05/26/15 1339  CHOL 197  HDL 11*  LDLCALC 143*  TRIG 217*  CHOLHDL 17.9*   Thyroid function studies:  Recent Labs  05/26/15 1339  TSH 0.472   Sepsis Labs:  Recent Labs Lab 05/26/15 1403 05/27/15 2123 05/28/15 0500  WBC 9.7  --  8.7  LATICACIDVEN  --  0.93  --    Microbiology No results found for this or any previous visit (from the past 240 hour(s)).   Medications:   . famotidine  20 mg Oral BID  . gemfibrozil  600 mg Oral BID AC  . levofloxacin (LEVAQUIN) IV  500 mg Intravenous Q24H  . lisinopril  10 mg Oral Daily  . metronidazole  500 mg Intravenous Q8H   Continuous Infusions: . sodium chloride 100 mL/hr at 05/28/15 0240    Time spent: 35 minutes with > 50% of time discussing current diagnostic test results, clinical impression and plan of care.   LOS: 1 day   Arletha Marschke  [facility] Pager [phone]. If unable to reach me by pager, please call my cell phone at [phone].  *Please refer to [email or web address], password [email or web address] to get updated  schedule on who will round on this patient, as hospitalists switch teams weekly. If 7PM-7AM, please contact night-coverage at [email or web address], password [email or web address] for any overnight needs.  05/28/2015, 8:12 AM

## 2015-05-28 NOTE — Progress Notes (Signed)
PT Cancellation Note  Patient Details Name: Travis Lawrence MRN: [id number] DOB: July 12, 1956   Cancelled Treatment:    Reason Eval/Treat Not Completed: PT screened, no needs identified, will sign off   Rada Hay 05/28/2015, 12:44 PM 05/28/2015 Blanchard Kelch PT [phone]

## 2015-05-29 DIAGNOSIS — R7989 Other specified abnormal findings of blood chemistry: Secondary | ICD-10-CM | POA: Insufficient documentation

## 2015-05-29 DIAGNOSIS — R945 Abnormal results of liver function studies: Secondary | ICD-10-CM

## 2015-05-29 LAB — COMPREHENSIVE METABOLIC PANEL
ALT: 74 U/L — ABNORMAL HIGH (ref 17–63)
AST: 34 U/L (ref 15–41)
Albumin: 2.9 g/dL — ABNORMAL LOW (ref 3.5–5.0)
Alkaline Phosphatase: 384 U/L — ABNORMAL HIGH (ref 38–126)
Anion gap: 8 (ref 5–15)
BUN: 9 mg/dL (ref 6–20)
CO2: 22 mmol/L (ref 22–32)
Calcium: 9 mg/dL (ref 8.9–10.3)
Chloride: 109 mmol/L (ref 101–111)
Creatinine, Ser: 0.97 mg/dL (ref 0.61–1.24)
GFR calc Af Amer: >60 mL/min (ref >60)
GFR calc non Af Amer: >60 mL/min (ref >60)
Glucose, Bld: 110 mg/dL — ABNORMAL HIGH (ref 65–99)
Potassium: 4 mmol/L (ref 3.5–5.1)
Sodium: 139 mmol/L (ref 135–145)
Total Bilirubin: 1.8 mg/dL — ABNORMAL HIGH (ref 0.3–1.2)
Total Protein: 6.8 g/dL (ref 6.5–8.1)

## 2015-05-29 LAB — SURGICAL PCR SCREEN
MRSA, PCR: NEGATIVE
Staphylococcus aureus: NEGATIVE

## 2015-05-29 LAB — CBC
HCT: 41.2 % (ref 39.0–52.0)
Hemoglobin: 14.1 g/dL (ref 13.0–17.0)
MCH: 32.7 pg (ref 26.0–34.0)
MCHC: 34.2 g/dL (ref 30.0–36.0)
MCV: 95.6 fL (ref 78.0–100.0)
Platelets: 385 10*3/uL (ref 150–400)
RBC: 4.31 MIL/uL (ref 4.22–5.81)
RDW: 14.1 % (ref 11.5–15.5)
WBC: 6 10*3/uL (ref 4.0–10.5)

## 2015-05-29 LAB — GLUCOSE, CAPILLARY: Glucose-Capillary: 106 mg/dL — ABNORMAL HIGH (ref 65–99)

## 2015-05-29 LAB — LIPASE, BLOOD: Lipase: 111 U/L — ABNORMAL HIGH (ref 22–51)

## 2015-05-29 NOTE — Progress Notes (Signed)
  Gastroenterology Progress Note Covering for Dr. Elnoria Howard  Subjective:  Feels well. No abd pain/N/V. On levaquin and flagyl.   Objective:  Vital signs in last 24 hours: Temp:  [97.9 F (36.6 C)-98.3 F (36.8 C)] 97.9 F (36.6 C) (07/31 0458) Pulse Rate:  [80-88] 85 (07/31 0458) Resp:  [16] 16 (07/31 0458) BP: (110-131)/(77-84) 110/77 mmHg (07/31 0458) SpO2:  [95 %-99 %] 99 % (07/31 0458) Last BM Date: 05/28/15 General:   Alert, well-developed, in NAD Heart:  Regular rate and rhythm; no murmurs Pulm: lungs clear Abdomen:  Soft, nontender and nondistended. Normal bowel sounds, without guarding, and without rebound.   Extremities:  Without edema. Neurologic: Alert and  oriented x4;  grossly normal neurologically. Psych:  Alert and cooperative. Normal mood and affect.  Intake/Output from previous day: 07/30 0701 - 07/31 0700 In: 6621.7 [P.O.:1360; I.V.:4851.9; IV Piggyback:409.8] Out: 4025 [Urine:4025] Intake/Output this shift:    Lab Results:  Recent Labs  05/26/15 1403 05/28/15 0500 05/29/15 0424  WBC 9.7 8.7 6.0  HGB 15.2 14.4 14.1  HCT 44.3 41.1 41.2  PLT  --  335 385   BMET  Recent Labs  05/26/15 1339 05/28/15 0100 05/29/15 0424  NA 136 138 139  K 3.7 4.0 4.0  CL 102 107 109  CO2 GLUCOSE 91 122* 110*  BUN CREATININE 0.77 0.84 0.97  CALCIUM 9.2 8.9 9.0   LFT  Recent Labs  05/29/15 0424  PROT 6.8  ALBUMIN 2.9*  AST 34  ALT 74*  ALKPHOS 384*  BILITOT 1.8*   PT/INR  Recent Labs  05/28/15 0100  LABPROT 13.3  INR 0.99   Hepatitis Panel  Recent Labs  05/26/15 1339  HEPBSAG NEGATIVE  HCVAB NEGATIVE  HEPAIGM NON REACTIVE  HEPBIGM NON REACTIVE      ASSESSMENT/PLAN:  59 year old male with a history of hypertriglyceridemia, hypertension, GERD, schizophrenia, admitted with abdominal pain. CT revealed pancreatitis and enhancement of the common bile duct and main hepatic ducts compatible with cholangitis. Ultrasound showed  cholelithiasis with contracted gallbladder, no bile duct dilatation. Currently on Levaquin and Flagyl. Trend LFTs.For possible cholecystectomy ? Tomorrow.     LOS: 2 days   Hvozdovic, Moise Boring 05/29/2015, Pager [phone]     Attending physician's note   I have taken an interval history, reviewed the chart and examined the patient. I agree with the Advanced Practitioner's note, impression and recommendations. Cholecystectomy and IOC is probably best course for mgmt with LFTs improving and CBD not dilated. Post op ERCP if IOC shows stones. Dr. Elnoria Howard to assume care on Monday.   Venita Lick. Russella Dar, MD Clementeen Graham [phone] pager Mon-Fri 8a-5p [phone] weekends, holidays and 5p-8a or per [facility]

## 2015-05-29 NOTE — Progress Notes (Signed)
General Surgery Note  LOS: 2 days  POD -     Assessment/Plan: 1.  Cholangitis - By CT scan  On Levaquin and Flagyl  He clinically has a benign abdomen WBC - 6,000 - 05/29/2015  Alk Phos - 384 and T. Bili - 1.8 - 05/29/2015  Seen by Dr. Fuller Plan yesterday  2. Gall stones by [place] Will plan cholecystectomy tomorrow.  Dr. Redmond Pulling is our surgeon of the week and will decide on timing of surgery.  I discussed indications and complications of surgery with the patient.  3. History of schizophrenia Gets a shot once a month - at [facility])  4. HTN 5. Hyperlipidemia 6. GERD 7. Recently quit smoking - 7 days ago   Principal Problem:   Pancreatitis, acute Active Problems:   HYPERTRIGLYCERIDEMIA   Paranoid schizophrenia   Essential hypertension, benign   GERD   Abdominal pain   Cholelithiasis   Elevated LFTs   Subjective:  Feels okay.  Tolerating diet without symptoms. Objective:   Filed Vitals:   05/29/15 0458  BP: 110/77  Pulse: 85  Temp: 97.9 F (36.6 C)  Resp: 16     Intake/Output from previous day:  07/30 0701 - 07/31 0700 In: 6621.7 [P.O.:1360; I.V.:4851.9; IV Piggyback:409.8] Out: 4025 [Urine:4025]  Intake/Output this shift:  Total I/O In: 720 [P.O.:720] Out: 1450 [Urine:1450]   Physical Exam:   General: WN WM who is alert and oriented.    HEENT: Normal. Pupils equal. .   Lungs: Clear   Abdomen: Soft.  No mass or tenderness.   Lab Results:    Recent Labs  05/28/15 0500 05/29/15 0424  WBC 8.7 6.0  HGB 14.4 14.1  HCT 41.1 41.2  PLT 335 385    BMET   Recent Labs  05/28/15 0100 05/29/15 0424  NA 138 139  K 4.0 4.0  CL 107 109  CO2 22 22  GLUCOSE 122* 110*  BUN 6 9  CREATININE 0.84 0.97  CALCIUM 8.9 9.0    PT/INR   Recent Labs  05/28/15 0100  LABPROT 13.3  INR 0.99    ABG  No results for input(s): PHART, HCO3 in the last 72 hours.  Invalid  input(s): PCO2, PO2   Studies/Results:  Ct Abdomen Pelvis W Contrast  05/27/2015   CLINICAL DATA:  Diffuse abdominal pain for 1 week.  Abnormal LFTs.  EXAM: CT ABDOMEN AND PELVIS WITH CONTRAST  TECHNIQUE: Multidetector CT imaging of the abdomen and pelvis was performed using the standard protocol following bolus administration of intravenous contrast.  CONTRAST:  163m OMNIPAQUE IOHEXOL 300 MG/ML  SOLN  COMPARISON:  None.  FINDINGS: Lower chest:  Unremarkable  Hepatobiliary: The liver is unremarkable. There is wall thickening and enhancement of the common bile duct and main/left/right hepatic ducts, compatible with cholangitis. There is no evidence of biliary dilatation.  Pancreas: There is mild inflammation along the pancreatic body/tail. The pancreas is unremarkable. No pancreatic necrosis, venous thrombosis, hemorrhage or acute fluid collections identified.  Spleen: Unremarkable  Adrenals/Urinary Tract: The kidneys and adrenal glands are unremarkable. Mild circumferential bladder wall thickening is noted.  Stomach/Bowel: There is no evidence of bowel obstruction or focal bowel wall thickening.  Vascular/Lymphatic: No enlarged lymph nodes or abdominal aortic aneurysm. Aortic atherosclerotic calcifications are noted.  Reproductive: Prostate unremarkable.  Other: No free fluid, abscess or pneumoperitoneum.  Musculoskeletal: No acute or suspicious abnormalities moderate degenerative disc disease at L5-S1 identified.  IMPRESSION: Mild peripancreatic inflammation compatible with pancreatitis - correlate with laboratory  values. No evidence of pancreatic necrosis, hemorrhage, venous thrombosis or acute fluid collections.  Enhancement of the CBD and main hepatic ducts compatible with cholangitis. No evidence of biliary dilatation.  Aortic atherosclerotic disease.   Electronically Signed   By: Margarette Canada M.D.   On: 05/27/2015 18:52   US Abdomen Limited Ruq  05/27/2015   CLINICAL DATA:  Pancreatitis and cholangitis  by CT  EXAM: US ABDOMEN LIMITED - RIGHT UPPER QUADRANT  COMPARISON:  CT 05/27/2015  FINDINGS: Gallbladder:  The gallbladder is contracted around multiple calculi. There is no pericholecystic fluid.  Common bile duct:  Diameter: 5.9 mm  Liver:  Mildly coarsened echotexture without discrete lesion.  IMPRESSION: Cholelithiasis with contracted gallbladder. No pericholecystic fluid. No tenderness over the gallbladder. No bile duct dilatation.   Electronically Signed   By: Andreas Newport M.D.   On: 05/27/2015 22:26     Anti-infectives:   Anti-infectives    Start     Dose/Rate Route Frequency Ordered Stop   05/28/15 0800  levofloxacin (LEVAQUIN) IVPB 500 mg     500 mg 100 mL/hr over 60 Minutes Intravenous Every 24 hours 05/28/15 0028     05/28/15 0600  metroNIDAZOLE (FLAGYL) IVPB 500 mg     500 mg 100 mL/hr over 60 Minutes Intravenous Every 8 hours 05/28/15 0028     05/27/15 2030  cefTRIAXone (ROCEPHIN) 1 g in dextrose 5 % 50 mL IVPB     1 g 100 mL/hr over 30 Minutes Intravenous  Once 05/27/15 2029 05/27/15 2131   05/27/15 2030  metroNIDAZOLE (FLAGYL) IVPB 500 mg     500 mg 100 mL/hr over 60 Minutes Intravenous  Once 05/27/15 2029 05/27/15 2245      Alphonsa Overall, MD, FACS Pager: [facility] Office: [phone] 05/29/2015

## 2015-05-29 NOTE — Progress Notes (Signed)
Utilization Review Completed.Travis Lawrence T7/31/2016  

## 2015-05-29 NOTE — Progress Notes (Signed)
Progress Note   [name] ZOX:[id number] DOB: 1955-12-27 DOA: 05/27/2015 PCP: Tereso Newcomer, PA-C   Brief Narrative:   Travis Lawrence is an 59 y.o. male the PMH of hypertension, hypertriglyceridemia, GERD and schizophrenia who was admitted 05/27/15 with a chief complaint of a 4-5 day history of abdominal pain. He was evaluated at an urgent care center and had a CT scan of the abdomen which showed evidence of pancreatitis and cholangitis. His liver enzymes were elevated and he subsequently was referred to the ER for further evaluation. Patient is being followed by GI and general surgery with plan for cholecystectomy over the next few days.  Assessment/Plan:   Principal Problem:   Pancreatitis, acute / cholangitis / cholelithiasis with abdominal pain - CT done 05/27/15: Mild peripancreatic inflammation compatible with pancreatitis. Lipase elevated. - CT also showed: Enhancement of the CBD and main hepatic ducts compatible with cholangitis. - Clinically, the patient is without significant abdominal pain or signs of serious infection. LFTs trending down. - Abdominal ultrasound showed cholelithiasis in a contracted gallbladder. - GI and surgery consulted. For possible cholecystectomy soon. - Continue empiric levofloxacin and Flagyl for treatment of possible cholangitis. - HIV and hepatitis serologies were negative.  Active Problems:   HYPERTRIGLYCERIDEMIA - Triglycerides were 217 on 05/26/15. Continue gemfibrozil.    Paranoid schizophrenia - Receives monthly Haldol injections, last dose approximately 2 weeks ago.    Essential hypertension, benign - Continue lisinopril.    GERD - Continue Pepcid.    DVT Prophylaxis - SCDs ordered.  Family Communication: Sister updated at the bedside 05/28/15, no family at bedside today. Disposition Plan: Home when stable, likely several more days pending cholecystectomy. Code Status:     Code Status Orders        Start     Ordered   05/28/15 0028  Full code   Continuous     05/28/15 0028        IV Access:    Peripheral IV   Procedures and diagnostic studies:   Ct Abdomen Pelvis W Contrast  05/27/2015   CLINICAL DATA:  Diffuse abdominal pain for 1 week.  Abnormal LFTs.  EXAM: CT ABDOMEN AND PELVIS WITH CONTRAST  TECHNIQUE: Multidetector CT imaging of the abdomen and pelvis was performed using the standard protocol following bolus administration of intravenous contrast.  CONTRAST:  OMNIPAQUE IOHEXOL 300 MG/ML  SOLN  COMPARISON:  None.  FINDINGS: Lower chest:  Unremarkable  Hepatobiliary: The liver is unremarkable. There is wall thickening and enhancement of the common bile duct and main/left/right hepatic ducts, compatible with cholangitis. There is no evidence of biliary dilatation.  Pancreas: There is mild inflammation along the pancreatic body/tail. The pancreas is unremarkable. No pancreatic necrosis, venous thrombosis, hemorrhage or acute fluid collections identified.  Spleen: Unremarkable  Adrenals/Urinary Tract: The kidneys and adrenal glands are unremarkable. Mild circumferential bladder wall thickening is noted.  Stomach/Bowel: There is no evidence of bowel obstruction or focal bowel wall thickening.  Vascular/Lymphatic: No enlarged lymph nodes or abdominal aortic aneurysm. Aortic atherosclerotic calcifications are noted.  Reproductive: Prostate unremarkable.  Other: No free fluid, abscess or pneumoperitoneum.  Musculoskeletal: No acute or suspicious abnormalities moderate degenerative disc disease at L5-S1 identified.  IMPRESSION: Mild peripancreatic inflammation compatible with pancreatitis - correlate with laboratory values. No evidence of pancreatic necrosis, hemorrhage, venous thrombosis or acute fluid collections.  Enhancement of the CBD and main hepatic ducts compatible with cholangitis. No evidence of biliary dilatation.  Aortic atherosclerotic disease.  Electronically Signed   By: Harmon Pier M.D.   On:  05/27/2015 18:52   US Abdomen Limited Ruq  05/27/2015   CLINICAL DATA:  Pancreatitis and cholangitis by CT  EXAM: US ABDOMEN LIMITED - RIGHT UPPER QUADRANT  COMPARISON:  CT 05/27/2015  FINDINGS: Gallbladder:  The gallbladder is contracted around multiple calculi. There is no pericholecystic fluid.  Common bile duct:  Diameter: 5.9 mm  Liver:  Mildly coarsened echotexture without discrete lesion.  IMPRESSION: Cholelithiasis with contracted gallbladder. No pericholecystic fluid. No tenderness over the gallbladder. No bile duct dilatation.   Electronically Signed   By: Ellery Plunk M.D.   On: 05/27/2015 22:26     Medical Consultants:    Gastroenterology  General Surgery  Anti-Infectives:    Levofloxacin 05/27/15--->  Flagyl 05/27/15--->  Subjective:   Travis Lawrence denies current abdominal pain.  No nausea or vomiting. No dyspnea. Bowels moved last night.  Objective:    Filed Vitals:   05/28/15 0427 05/28/15 1335 05/28/15 2137 05/29/15 0458  BP: 128/89 131/84 127/83 110/77  Pulse: 85 88 80 85  Temp: 98.1 F (36.7 C) 98 F (36.7 C) 98.3 F (36.8 C) 97.9 F (36.6 C)  TempSrc: Oral Oral Oral Oral  Resp: 18 16 16 16   Height:      Weight:      SpO2: 97% 98% 95% 99%    Intake/Output Summary (Last 24 hours) at 05/29/15 0732 Last data filed at 05/29/15 0545  Gross per 24 hour  Intake 6621.73 ml  Output   3625 ml  Net 2996.73 ml    Exam: Gen:  NAD Cardiovascular:  RRR, No M/R/G Respiratory:  Lungs CTAB Gastrointestinal:  Abdomen soft, NT/ND, + BS Extremities:  No C/E/C   Data Reviewed:    Labs: Basic Metabolic Panel:  Recent Labs Lab 05/26/15 1339 05/28/15 0100 05/29/15 0424  NA 136 138 139  K 3.7 4.0 4.0  CL 102 107 109  CO2 22 22 22   GLUCOSE 91 122* 110*  BUN 12 6 9   CREATININE 0.77 0.84 0.97  CALCIUM 9.2 8.9 9.0   GFR Estimated Creatinine Clearance: 86.1 mL/min (by C-G formula based on Cr of 0.97). Liver Function Tests:  Recent  Labs Lab 05/26/15 1339 05/28/15 0100 05/29/15 0424  AST 61* 45* 34  ALT 129* 96* 74*  ALKPHOS 583* 442* 384*  BILITOT 2.5* 1.7* 1.8*  PROT 7.0 7.3 6.8  ALBUMIN 3.9 3.2* 2.9*    Recent Labs Lab 05/26/15 1339 05/27/15 2117 05/29/15 0424  LIPASE 423* 195* 111*   Coagulation profile  Recent Labs Lab 05/28/15 0100  INR 0.99    CBC:  Recent Labs Lab 05/26/15 1403 05/28/15 0500 05/29/15 0424  WBC 9.7 8.7 6.0  HGB 15.2 14.4 14.1  HCT 44.3 41.1 41.2  MCV 94.0 93.2 95.6  PLT  --  335 385   CBG:  Recent Labs Lab 05/28/15 0726 05/29/15 0729  GLUCAP 108* 106*   Lipid Profile:  Recent Labs  05/26/15 1339  CHOL 197  HDL 11*  LDLCALC 143*  TRIG 217*  CHOLHDL 17.9*   Thyroid function studies:  Recent Labs  05/26/15 1339  TSH 0.472   Sepsis Labs:  Recent Labs Lab 05/26/15 1403 05/27/15 2123 05/28/15 0500 05/29/15 0424  WBC 9.7  --  8.7 6.0  LATICACIDVEN  --  0.93  --   --    Microbiology No results found for this or any previous visit (from the past 240 hour(s)).  Medications:   . famotidine  20 mg Oral BID  . gemfibrozil  600 mg Oral BID AC  . levofloxacin (LEVAQUIN) IV  500 mg Intravenous Q24H  . lisinopril  10 mg Oral Daily  . metronidazole  500 mg Intravenous Q8H   Continuous Infusions: . sodium chloride 100 mL/hr at 05/29/15 0317    Time spent: 25 minutes.   LOS: 2 days   Travis Lawrence  [facility] Pager [phone]. If unable to reach me by pager, please call my cell phone at [phone].  *Please refer to [email or web address], password [email or web address] to get updated schedule on who will round on this patient, as hospitalists switch teams weekly. If 7PM-7AM, please contact night-coverage at [email or web address], password [email or web address] for any overnight needs.  05/29/2015, 7:32 AM

## 2015-05-30 ENCOUNTER — Inpatient Hospital Stay (HOSPITAL_COMMUNITY): Payer: Commercial Managed Care - HMO | Admitting: Certified Registered Nurse Anesthetist

## 2015-05-30 ENCOUNTER — Encounter (HOSPITAL_COMMUNITY)
Admission: EM | Disposition: A | Discharge status: Home / Self Care | Payer: Self-pay | Source: Home / Self Care | Attending: Internal Medicine

## 2015-05-30 ENCOUNTER — Encounter (HOSPITAL_COMMUNITY): Payer: Self-pay | Admitting: Certified Registered Nurse Anesthetist

## 2015-05-30 ENCOUNTER — Inpatient Hospital Stay (HOSPITAL_COMMUNITY): Payer: Commercial Managed Care - HMO

## 2015-05-30 DIAGNOSIS — K859 Acute pancreatitis, unspecified: Secondary | ICD-10-CM

## 2015-05-30 HISTORY — PX: CHOLECYSTECTOMY: SHX55

## 2015-05-30 LAB — COMPREHENSIVE METABOLIC PANEL
ALT: 76 U/L — ABNORMAL HIGH (ref 17–63)
AST: 44 U/L — ABNORMAL HIGH (ref 15–41)
Albumin: 2.9 g/dL — ABNORMAL LOW (ref 3.5–5.0)
Alkaline Phosphatase: 352 U/L — ABNORMAL HIGH (ref 38–126)
Anion gap: 7 (ref 5–15)
BUN: 9 mg/dL (ref 6–20)
CO2: 23 mmol/L (ref 22–32)
Calcium: 9.2 mg/dL (ref 8.9–10.3)
Chloride: 109 mmol/L (ref 101–111)
Creatinine, Ser: 0.95 mg/dL (ref 0.61–1.24)
GFR calc Af Amer: >60 mL/min (ref >60)
GFR calc non Af Amer: >60 mL/min (ref >60)
Glucose, Bld: 118 mg/dL — ABNORMAL HIGH (ref 65–99)
Potassium: 4 mmol/L (ref 3.5–5.1)
Sodium: 139 mmol/L (ref 135–145)
Total Bilirubin: 1.3 mg/dL — ABNORMAL HIGH (ref 0.3–1.2)
Total Protein: 6.8 g/dL (ref 6.5–8.1)

## 2015-05-30 LAB — GLUCOSE, CAPILLARY: Glucose-Capillary: 125 mg/dL — ABNORMAL HIGH (ref 65–99)

## 2015-05-30 SURGERY — LAPAROSCOPIC CHOLECYSTECTOMY WITH INTRAOPERATIVE CHOLANGIOGRAM
Anesthesia: General | Site: Abdomen

## 2015-05-30 MED ORDER — METRONIDAZOLE IN NACL 5-0.79 MG/ML-% IV SOLN
500.0000 mg | Freq: Three times a day (TID) | INTRAVENOUS | Status: AC
  Administered 2015-05-30 – 2015-05-31 (×3): 500 mg via INTRAVENOUS
  Filled 2015-05-30 (×3): qty 100

## 2015-05-30 MED ORDER — FENTANYL CITRATE (PF) 100 MCG/2ML IJ SOLN
INTRAMUSCULAR | Status: DC | PRN
  Administered 2015-05-30 (×4): 50 ug via INTRAVENOUS
  Administered 2015-05-30: 100 ug via INTRAVENOUS
  Administered 2015-05-30: 50 ug via INTRAVENOUS

## 2015-05-30 MED ORDER — HEPARIN SODIUM (PORCINE) 5000 UNIT/ML IJ SOLN
5000.0000 [IU] | Freq: Three times a day (TID) | INTRAMUSCULAR | Status: DC
  Administered 2015-05-31 – 2015-06-01 (×4): 5000 [IU] via SUBCUTANEOUS
  Filled 2015-05-30 (×7): qty 1

## 2015-05-30 MED ORDER — 0.9 % SODIUM CHLORIDE (POUR BTL) OPTIME
TOPICAL | Status: DC | PRN
Start: 2015-05-30 — End: 2015-05-30
  Administered 2015-05-30: 1000 mL

## 2015-05-30 MED ORDER — EPHEDRINE SULFATE 50 MG/ML IJ SOLN
INTRAMUSCULAR | Status: AC
  Filled 2015-05-30: qty 1

## 2015-05-30 MED ORDER — LEVOFLOXACIN IN D5W 500 MG/100ML IV SOLN
500.0000 mg | INTRAVENOUS | Status: AC
  Administered 2015-05-31: 500 mg via INTRAVENOUS
  Filled 2015-05-30: qty 100

## 2015-05-30 MED ORDER — PHENYLEPHRINE HCL 10 MG/ML IJ SOLN
10.0000 mg | INTRAVENOUS | Status: DC | PRN
  Administered 2015-05-30: 40 ug/min via INTRAVENOUS

## 2015-05-30 MED ORDER — PHENYLEPHRINE HCL 10 MG/ML IJ SOLN
INTRAMUSCULAR | Status: DC | PRN
  Administered 2015-05-30 (×2): 80 ug via INTRAVENOUS
  Administered 2015-05-30: 100 ug via INTRAVENOUS
  Administered 2015-05-30 (×2): 80 ug via INTRAVENOUS
  Administered 2015-05-30: 100 ug via INTRAVENOUS
  Administered 2015-05-30 (×2): 80 ug via INTRAVENOUS

## 2015-05-30 MED ORDER — BUPIVACAINE-EPINEPHRINE 0.25% -1:200000 IJ SOLN
INTRAMUSCULAR | Status: DC | PRN
  Administered 2015-05-30: 20 mL

## 2015-05-30 MED ORDER — PHENYLEPHRINE HCL 10 MG/ML IJ SOLN
INTRAMUSCULAR | Status: AC
  Filled 2015-05-30: qty 1

## 2015-05-30 MED ORDER — LACTATED RINGERS IV SOLN
INTRAVENOUS | Status: AC
  Administered 2015-05-30: 15:00:00 via INTRAVENOUS
  Administered 2015-05-30: 1000 mL via INTRAVENOUS
  Administered 2015-05-30: 17:00:00 via INTRAVENOUS

## 2015-05-30 MED ORDER — METRONIDAZOLE IN NACL 5-0.79 MG/ML-% IV SOLN
INTRAVENOUS | Status: AC
  Filled 2015-05-30: qty 100

## 2015-05-30 MED ORDER — FENTANYL CITRATE (PF) 250 MCG/5ML IJ SOLN
INTRAMUSCULAR | Status: AC
  Filled 2015-05-30: qty 25

## 2015-05-30 MED ORDER — NEOSTIGMINE METHYLSULFATE 10 MG/10ML IV SOLN
INTRAVENOUS | Status: DC | PRN
  Administered 2015-05-30: 4 mg via INTRAVENOUS

## 2015-05-30 MED ORDER — MORPHINE SULFATE 2 MG/ML IJ SOLN: 1.0000 mg | INTRAMUSCULAR | Status: DC | PRN

## 2015-05-30 MED ORDER — HYDROMORPHONE HCL 1 MG/ML IJ SOLN
0.2500 mg | INTRAMUSCULAR | Status: DC | PRN
  Administered 2015-05-30 (×2): 0.5 mg via INTRAVENOUS

## 2015-05-30 MED ORDER — MIDAZOLAM HCL 2 MG/2ML IJ SOLN
INTRAMUSCULAR | Status: AC
  Filled 2015-05-30: qty 4

## 2015-05-30 MED ORDER — HYDROMORPHONE HCL 1 MG/ML IJ SOLN
INTRAMUSCULAR | Status: AC
  Filled 2015-05-30: qty 1

## 2015-05-30 MED ORDER — LACTATED RINGERS IR SOLN
Status: DC | PRN
  Administered 2015-05-30: 1000 mL

## 2015-05-30 MED ORDER — ONDANSETRON HCL 4 MG/2ML IJ SOLN
INTRAMUSCULAR | Status: DC | PRN
  Administered 2015-05-30: 4 mg via INTRAVENOUS

## 2015-05-30 MED ORDER — MIDAZOLAM HCL 5 MG/5ML IJ SOLN
INTRAMUSCULAR | Status: DC | PRN
  Administered 2015-05-30: 2 mg via INTRAVENOUS

## 2015-05-30 MED ORDER — ROCURONIUM BROMIDE 100 MG/10ML IV SOLN
INTRAVENOUS | Status: DC | PRN
  Administered 2015-05-30 (×2): 10 mg via INTRAVENOUS
  Administered 2015-05-30: 40 mg via INTRAVENOUS

## 2015-05-30 MED ORDER — ROCURONIUM BROMIDE 100 MG/10ML IV SOLN
INTRAVENOUS | Status: AC
  Filled 2015-05-30: qty 1

## 2015-05-30 MED ORDER — OXYCODONE HCL 5 MG PO TABS
5.0000 mg | ORAL_TABLET | ORAL | Status: DC | PRN
  Administered 2015-05-30 – 2015-06-01 (×4): 5 mg via ORAL
  Filled 2015-05-30 (×4): qty 1

## 2015-05-30 MED ORDER — PHENYLEPHRINE 40 MCG/ML (10ML) SYRINGE FOR IV PUSH (FOR BLOOD PRESSURE SUPPORT)
PREFILLED_SYRINGE | INTRAVENOUS | Status: AC
  Filled 2015-05-30: qty 20

## 2015-05-30 MED ORDER — GLYCOPYRROLATE 0.2 MG/ML IJ SOLN
INTRAMUSCULAR | Status: DC | PRN
  Administered 2015-05-30: 0.6 mg via INTRAVENOUS

## 2015-05-30 MED ORDER — LIDOCAINE HCL (CARDIAC) 20 MG/ML IV SOLN
INTRAVENOUS | Status: DC | PRN
  Administered 2015-05-30: 50 mg via INTRAVENOUS

## 2015-05-30 MED ORDER — PROPOFOL 10 MG/ML IV BOLUS
INTRAVENOUS | Status: DC | PRN
  Administered 2015-05-30: 60 mg via INTRAVENOUS
  Administered 2015-05-30: 140 mg via INTRAVENOUS

## 2015-05-30 MED ORDER — ONDANSETRON HCL 4 MG/2ML IJ SOLN
INTRAMUSCULAR | Status: AC
  Filled 2015-05-30: qty 2

## 2015-05-30 MED ORDER — PROMETHAZINE HCL 25 MG/ML IJ SOLN: 12.5000 mg | Freq: Four times a day (QID) | INTRAMUSCULAR | Status: DC | PRN

## 2015-05-30 MED ORDER — SODIUM CHLORIDE 0.9 % IJ SOLN
INTRAMUSCULAR | Status: AC
  Filled 2015-05-30: qty 10

## 2015-05-30 MED ORDER — FENTANYL CITRATE (PF) 100 MCG/2ML IJ SOLN
INTRAMUSCULAR | Status: AC
  Filled 2015-05-30: qty 4

## 2015-05-30 MED ORDER — BUPIVACAINE-EPINEPHRINE 0.25% -1:200000 IJ SOLN
INTRAMUSCULAR | Status: AC
  Filled 2015-05-30: qty 1

## 2015-05-30 SURGICAL SUPPLY — 41 items
APPLIER CLIP 5 13 M/L LIGAMAX5 (MISCELLANEOUS) ×2
APPLIER CLIP ROT 10 11.4 M/L (STAPLE) ×2
CABLE HIGH FREQUENCY MONO STRZ (ELECTRODE) ×2 IMPLANT
CHLORAPREP W/TINT 26ML (MISCELLANEOUS) ×2 IMPLANT
CLIP APPLIE 5 13 M/L LIGAMAX5 (MISCELLANEOUS) ×1 IMPLANT
CLIP APPLIE ROT 10 11.4 M/L (STAPLE) ×1 IMPLANT
COVER MAYO STAND STRL (DRAPES) ×2 IMPLANT
COVER SURGICAL LIGHT HANDLE (MISCELLANEOUS) IMPLANT
DECANTER SPIKE VIAL GLASS SM (MISCELLANEOUS) IMPLANT
DERMABOND ADVANCED (GAUZE/BANDAGES/DRESSINGS) ×1
DERMABOND ADVANCED .7 DNX12 (GAUZE/BANDAGES/DRESSINGS) ×1 IMPLANT
DRAIN CHANNEL 19F RND (DRAIN) ×2 IMPLANT
DRAPE C-ARM 42X120 X-RAY (DRAPES) ×2 IMPLANT
DRAPE LAPAROSCOPIC ABDOMINAL (DRAPES) ×2 IMPLANT
ELECT REM PT RETURN 9FT ADLT (ELECTROSURGICAL) ×2
ELECTRODE REM PT RTRN 9FT ADLT (ELECTROSURGICAL) ×1 IMPLANT
GLOVE BIOGEL M STRL SZ7.5 (GLOVE) ×4 IMPLANT
GLOVE BIOGEL PI IND STRL 7.0 (GLOVE) ×3 IMPLANT
GLOVE BIOGEL PI INDICATOR 7.0 (GLOVE) ×3
GOWN STRL REUS W/TWL XL LVL3 (GOWN DISPOSABLE) ×8 IMPLANT
HEMOSTAT SNOW SURGICEL 2X4 (HEMOSTASIS) IMPLANT
KIT BASIN OR (CUSTOM PROCEDURE TRAY) ×2 IMPLANT
LIQUID BAND (GAUZE/BANDAGES/DRESSINGS) IMPLANT
NS IRRIG 1000ML POUR BTL (IV SOLUTION) ×2 IMPLANT
PEN SKIN MARKING BROAD (MISCELLANEOUS) ×2 IMPLANT
POUCH RETRIEVAL ECOSAC 10 (ENDOMECHANICALS) ×1 IMPLANT
POUCH RETRIEVAL ECOSAC 10MM (ENDOMECHANICALS) ×1
POUCH SPECIMEN RETRIEVAL 10MM (ENDOMECHANICALS) IMPLANT
SCISSORS LAP 5X35 DISP (ENDOMECHANICALS) ×2 IMPLANT
SET CHOLANGIOGRAPH MIX (MISCELLANEOUS) ×2 IMPLANT
SET IRRIG TUBING LAPAROSCOPIC (IRRIGATION / IRRIGATOR) ×2 IMPLANT
SLEEVE XCEL OPT CAN 5 100 (ENDOMECHANICALS) ×4 IMPLANT
STAPLE ECHEON FLEX 60 POW ENDO (STAPLE) ×2 IMPLANT
SUT MNCRL AB 4-0 PS2 18 (SUTURE) ×4 IMPLANT
SUT VICRYL 0 UR6 27IN ABS (SUTURE) IMPLANT
TOWEL OR 17X26 10 PK STRL BLUE (TOWEL DISPOSABLE) ×2 IMPLANT
TOWEL OR NON WOVEN STRL DISP B (DISPOSABLE) ×2 IMPLANT
TRAY LAPAROSCOPIC (CUSTOM PROCEDURE TRAY) ×2 IMPLANT
TROCAR BLADELESS OPT 5 100 (ENDOMECHANICALS) ×2 IMPLANT
TROCAR XCEL BLUNT TIP 100MML (ENDOMECHANICALS) ×2 IMPLANT
TROCAR XCEL NON-BLD 11X100MML (ENDOMECHANICALS) IMPLANT

## 2015-05-30 NOTE — Interval H&P Note (Signed)
History and Physical Interval Note:  05/30/2015 1:39 PM  Travis Lawrence  has presented today for surgery, with the diagnosis of cholecystitis  The various methods of treatment have been discussed with the patient and family. After consideration of risks, benefits and other options for treatment, the patient has consented to  Procedure(s): LAPAROSCOPIC CHOLECYSTECTOMY WITH INTRAOPERATIVE CHOLANGIOGRAM (N/A) as a surgical intervention .  The patient's history has been reviewed, patient examined, no change in status, stable for surgery.  I have reviewed the patient's chart and labs.  Questions were answered to the patient's satisfaction.    Mary Sella. Andrey Campanile, MD, FACS General, Bariatric, & Minimally Invasive Surgery [facility] Surgery, [place]   [facility]

## 2015-05-30 NOTE — Anesthesia Postprocedure Evaluation (Signed)
  Anesthesia Post-op Note  Patient: Travis Lawrence  Procedure(s) Performed: Procedure(s): LAPAROSCOPIC SUBTOTAL CHOLECYSTECTOMY   (N/A)  Patient Location: PACU  Anesthesia Type:General  Level of Consciousness: awake and alert   Airway and Oxygen Therapy: Patient Spontanous Breathing  Post-op Pain: Controlled  Post-op Assessment: Post-op Vital signs reviewed, Patient's Cardiovascular Status Stable and Respiratory Function Stable  Post-op Vital Signs: Reviewed  Filed Vitals:   05/30/15 1630  BP: 133/81  Pulse: 74  Temp:   Resp: 19    Complications: No apparent anesthesia complications

## 2015-05-30 NOTE — Anesthesia Procedure Notes (Signed)
Procedure Name: Intubation Performed by: Kizzie Fantasia Pre-anesthesia Checklist: Patient identified, Emergency Drugs available, Suction available, Patient being monitored and Timeout performed Patient Re-evaluated:Patient Re-evaluated prior to inductionOxygen Delivery Method: Circle system utilized Preoxygenation: Pre-oxygenation with 100% oxygen Intubation Type: IV induction Ventilation: Mask ventilation without difficulty Laryngoscope Size: Mac and 4 Grade View: Grade I Tube size: 7.5 mm Number of attempts: 1 Airway Equipment and Method: Stylet Placement Confirmation: ETT inserted through vocal cords under direct vision,  positive ETCO2,  CO2 detector and breath sounds checked- equal and bilateral Secured at: 22 cm Tube secured with: Tape Dental Injury: Teeth and Oropharynx as per pre-operative assessment

## 2015-05-30 NOTE — Progress Notes (Signed)
Subjective: No pain.  He feels well at this time.  Objective: Vital signs in last 24 hours: Temp:  [98 F (36.7 C)-98.7 F (37.1 C)] 98.1 F (36.7 C) (08/01 0507) Pulse Rate:  [62-77] 62 (08/01 0507) Resp:  [16-18] 16 (08/01 0507) BP: (118-148)/(71-94) 148/91 mmHg (08/01 0507) SpO2:  [95 %-99 %] 95 % (08/01 0507) Last BM Date: 05/28/15  Intake/Output from previous day: 07/31 0701 - 08/01 0700 In: 4872.1 [P.O.:2580; I.V.:1892.1; IV Piggyback:400] Out: 4050 [Urine:4050] Intake/Output this shift:    General appearance: alert and no distress GI: soft, non-tender; bowel sounds normal; no masses,  no organomegaly  Lab Results:  Recent Labs  05/28/15 0500 05/29/15 0424  WBC 8.7 6.0  HGB 14.4 14.1  HCT 41.1 41.2  PLT 335 385   BMET  Recent Labs  05/28/15 0100 05/29/15 0424 05/30/15 0431  NA 138 139 139  K 4.0 4.0 4.0  CL 107 109 109  CO2 GLUCOSE 122* 110* 118*  BUN CREATININE 0.84 0.97 0.95  CALCIUM 8.9 9.0 9.2   LFT  Recent Labs  05/30/15 0431  PROT 6.8  ALBUMIN 2.9*  AST 44*  ALT 76*  ALKPHOS 352*  BILITOT 1.3*   PT/INR  Recent Labs  05/28/15 0100  LABPROT 13.3  INR 0.99   Hepatitis Panel No results for input(s): HEPBSAG, HCVAB, HEPAIGM, HEPBIGM in the last 72 hours. C-Diff No results for input(s): CDIFFTOX in the last 72 hours. Fecal Lactopherrin No results for input(s): FECLLACTOFRN in the last 72 hours.  Studies/Results: No results found.  Medications:  Scheduled: . famotidine  20 mg Oral BID  . gemfibrozil  600 mg Oral BID AC  . levofloxacin (LEVAQUIN) IV  500 mg Intravenous Q24H  . lisinopril  10 mg Oral Daily  . metronidazole  500 mg Intravenous Q8H   Continuous: . sodium chloride 100 mL/hr at 05/30/15 0431    Assessment/Plan: 1) Gallstone pancreatitis. 2) Cholelithiasis.   He feels well at this time.  Lap chole is scheduled for today.  His liver enzymes are trending downwards and the CT scan was  negative for any biliary ductal dilation.    Plan: 1) Proceed with the lap chole and hopefully IOC will be negative.   LOS: 3 days   Lavergne Hiltunen D 05/30/2015, 8:11 AM

## 2015-05-30 NOTE — H&P (View-Only) (Signed)
Day of Surgery  Subjective: Pt denies n/v/abd pain; chart reviewed. Relates intermittent h/o epigastric/ruq pain, colicky. Had bad episode last week. Got w/u by pcp &ended up being admitted.  Objective: Vital signs in last 24 hours: Temp:  [98 F (36.7 C)-98.7 F (37.1 C)] 98 F (36.7 C) (08/01 1116) Pulse Rate:  [62-84] 84 (08/01 1116) Resp:  [16] 16 (08/01 1116) BP: (133-148)/(91-99) 142/99 mmHg (08/01 1116) SpO2:  [95 %-98 %] 98 % (08/01 1116) Last BM Date: 05/28/15  Intake/Output from previous day: 07/31 0701 - 08/01 0700 In: 4872.1 [P.O.:2580; I.V.:1892.1; IV Piggyback:400] Out: 4050 [Urine:4050] Intake/Output this shift:    Alert, nad, nontoxic Reg Soft, nt, nd No jaundice  Lab Results:   Recent Labs  05/28/15 0500 05/29/15 0424  WBC 8.7 6.0  HGB 14.4 14.1  HCT 41.1 41.2  PLT 335 385   BMET  Recent Labs  05/29/15 0424 05/30/15 0431  NA 139 139  K 4.0 4.0  CL 109 109  CO2 22 23  GLUCOSE 110* 118*  BUN 9 9  CREATININE 0.97 0.95  CALCIUM 9.0 9.2   Hepatic Function Latest Ref Rng 05/30/2015 05/29/2015 05/28/2015  Total Protein 6.5 - 8.1 g/dL 6.8 6.8 7.3  Albumin 3.5 - 5.0 g/dL 2.9(L) 2.9(L) 3.2(L)  AST 15 - 41 U/L 44(H) 34 45(H)  ALT 17 - 63 U/L 76(H) 74(H) 96(H)  Alk Phosphatase 38 - 126 U/L 352(H) 384(H) 442(H)  Total Bilirubin 0.3 - 1.2 mg/dL 1.3(H) 1.8(H) 1.7(H)     PT/INR  Recent Labs  05/28/15 0100  LABPROT 13.3  INR 0.99   ABG No results for input(s): PHART, HCO3 in the last 72 hours.  Invalid input(s): PCO2, PO2  Studies/Results: No results found.  Anti-infectives: Anti-infectives    Start     Dose/Rate Route Frequency Ordered Stop   05/28/15 0800  [MAR Hold]  levofloxacin (LEVAQUIN) IVPB 500 mg     (MAR Hold since 05/30/15 1046)   500 mg 100 mL/hr over 60 Minutes Intravenous Every 24 hours 05/28/15 0028     05/28/15 0600  [MAR Hold]  metroNIDAZOLE (FLAGYL) IVPB 500 mg     (MAR Hold since 05/30/15 1046)   500 mg 100 mL/hr  over 60 Minutes Intravenous Every 8 hours 05/28/15 0028     05/27/15 2030  cefTRIAXone (ROCEPHIN) 1 g in dextrose 5 % 50 mL IVPB     1 g 100 mL/hr over 30 Minutes Intravenous  Once 05/27/15 2029 05/27/15 2131   05/27/15 2030  metroNIDAZOLE (FLAGYL) IVPB 500 mg     500 mg 100 mL/hr over 60 Minutes Intravenous  Once 05/27/15 2029 05/27/15 2245      Assessment/Plan: Ruq/epigastric pain Cholelithiasis "thickended bile duct on imaging"  With LFTs decreasing, doubt pt has underlying bile duct malignancy. No evidence of stricture on imaging. No dilatation. More suggestive of chronic cholecystitis. Hepatitis panel neg  rec proceed with Lap chole with IOC Discussed possibility of not being able to do intra-op IOC for technical reasons therefore may need addl imaging or labs postop.   I discussed laparoscopic cholecystectomy with IOC in detail.  The patient wasshown diagrams detailing the procedure.  We discussed the risks and benefits of a laparoscopic cholecystectomy including, but not limited to bleeding, infection, injury to surrounding structures such as the intestine or liver, bile leak, retained gallstones, need to convert to an open procedure, prolonged diarrhea, blood clots such as  DVT, common bile duct injury, anesthesia risks, and possible need for  additional procedures.  We discussed the typical post-operative recovery course. I explained that the likelihood of improvement of their symptoms is good.  All questions asked and answered. Family at bedside  Leighton Ruff. Redmond Pulling, MD, FACS General, Bariatric, & Minimally Invasive Surgery [facility]   s/p Procedure(s): LAPAROSCOPIC CHOLECYSTECTOMY WITH INTRAOPERATIVE CHOLANGIOGRAM (N/A)    LOS: 3 days    Travis Lawrence 05/30/2015

## 2015-05-30 NOTE — Transfer of Care (Signed)
Immediate Anesthesia Transfer of Care Note  Patient: Travis Lawrence  Procedure(s) Performed: Procedure(s): LAPAROSCOPIC SUBTOTAL CHOLECYSTECTOMY   (N/A)  Patient Location: PACU  Anesthesia Type:General  Level of Consciousness: sedated  Airway & Oxygen Therapy: Patient Spontanous Breathing and Patient connected to face mask oxygen  Post-op Assessment: Report given to RN and Post -op Vital signs reviewed and stable  Post vital signs: Reviewed and stable  Last Vitals:  Filed Vitals:   05/30/15 1116  BP: 142/99  Pulse: 84  Temp: 36.7 C  Resp: 16    Complications: No apparent anesthesia complications

## 2015-05-30 NOTE — Brief Op Note (Addendum)
05/27/2015 - 05/30/2015  3:42 PM  PATIENT:  Travis Lawrence  59 y.o. male  PRE-OPERATIVE DIAGNOSIS:  cholecystitis  POST-OPERATIVE DIAGNOSIS:  Chronic calculous cholecystitis  PROCEDURE:  Procedure(s): LAPAROSCOPIC SUBTOTAL CHOLECYSTECTOMY  (N/A)  SURGEON:  Surgeon(s) and Role:    * Gaynelle Adu, MD - Primary    * Luretha Murphy, MD - Assisting  PHYSICIAN ASSISTANT: none  ASSISTANTS: none   ANESTHESIA:   general  EBL:  Total I/O In: 1000 [I.V.:1000] Out: -   BLOOD ADMINISTERED:none  DRAINS: (46fr) Jackson-Pratt drain(s) with closed bulb suction in the RUQ-gallbladder fossa   LOCAL MEDICATIONS USED:  MARCAINE     SPECIMEN:  Source of Specimen:  subtotal gallbladder  DISPOSITION OF SPECIMEN:  PATHOLOGY  COUNTS:  YES  TOURNIQUET:  * No tourniquets in log *  DICTATION: .Other Dictation: Dictation Number [id number]   PLAN OF CARE: pacu then back to floor  PATIENT DISPOSITION:  PACU - hemodynamically stable.   Delay start of Pharmacological VTE agent (>24hrs) due to surgical blood loss or risk of bleeding: no  Mary Sella. Andrey Campanile, MD, FACS General, Bariatric, & Minimally Invasive Surgery [facility], [place]

## 2015-05-30 NOTE — Anesthesia Preprocedure Evaluation (Addendum)
Anesthesia Evaluation  Patient identified by MRN, date of birth, ID band Patient awake    Reviewed: Allergy & Precautions, H&P , NPO status , Patient's Chart, lab work & pertinent test results  Airway Mallampati: II  TM Distance: >3 FB Neck ROM: Full    Dental no notable dental hx. (+) Edentulous Upper, Edentulous Lower, Dental Advisory Given   Pulmonary neg pulmonary ROS, former smoker,  breath sounds clear to auscultation  Pulmonary exam normal       Cardiovascular hypertension, Pt. on medications Rhythm:Regular Rate:Normal     Neuro/Psych PSYCHIATRIC DISORDERS Schizophrenia negative neurological ROS     GI/Hepatic Neg liver ROS, GERD-  Medicated and Controlled,  Endo/Other  negative endocrine ROS  Renal/GU negative Renal ROS  negative genitourinary   Musculoskeletal   Abdominal   Peds  Hematology negative hematology ROS (+)   Anesthesia Other Findings   Reproductive/Obstetrics negative OB ROS                            Anesthesia Physical Anesthesia Plan  ASA: II  Anesthesia Plan: General   Post-op Pain Management:    Induction: Intravenous  Airway Management Planned: Oral ETT  Additional Equipment:   Intra-op Plan:   Post-operative Plan: Extubation in OR  Informed Consent: I have reviewed the patients History and Physical, chart, labs and discussed the procedure including the risks, benefits and alternatives for the proposed anesthesia with the patient or authorized representative who has indicated his/her understanding and acceptance.   Dental advisory given  Plan Discussed with: CRNA  Anesthesia Plan Comments:         Anesthesia Quick Evaluation

## 2015-05-30 NOTE — Progress Notes (Signed)
Day of Surgery  Subjective: Pt denies n/v/abd pain; chart reviewed. Relates intermittent h/o epigastric/ruq pain, colicky. Had bad episode last week. Got w/u by pcp &ended up being admitted.  Objective: Vital signs in last 24 hours: Temp:  [98 F (36.7 C)-98.7 F (37.1 C)] 98 F (36.7 C) (08/01 1116) Pulse Rate:  [62-84] 84 (08/01 1116) Resp:  [16] 16 (08/01 1116) BP: (133-148)/(91-99) 142/99 mmHg (08/01 1116) SpO2:  [95 %-98 %] 98 % (08/01 1116) Last BM Date: 05/28/15  Intake/Output from previous day: 07/31 0701 - 08/01 0700 In: 4872.1 [P.O.:2580; I.V.:1892.1; IV Piggyback:400] Out: 4050 [Urine:4050] Intake/Output this shift:    Alert, nad, nontoxic Reg Soft, nt, nd No jaundice  Lab Results:   Recent Labs  05/28/15 0500 05/29/15 0424  WBC 8.7 6.0  HGB 14.4 14.1  HCT 41.1 41.2  PLT 335 385   BMET  Recent Labs  05/29/15 0424 05/30/15 0431  NA 139 139  K 4.0 4.0  CL 109 109  CO2 22 23  GLUCOSE 110* 118*  BUN 9 9  CREATININE 0.97 0.95  CALCIUM 9.0 9.2   Hepatic Function Latest Ref Rng 05/30/2015 05/29/2015 05/28/2015  Total Protein 6.5 - 8.1 g/dL 6.8 6.8 7.3  Albumin 3.5 - 5.0 g/dL 2.9(L) 2.9(L) 3.2(L)  AST 15 - 41 U/L 44(H) 34 45(H)  ALT 17 - 63 U/L 76(H) 74(H) 96(H)  Alk Phosphatase 38 - 126 U/L 352(H) 384(H) 442(H)  Total Bilirubin 0.3 - 1.2 mg/dL 1.3(H) 1.8(H) 1.7(H)     PT/INR  Recent Labs  05/28/15 0100  LABPROT 13.3  INR 0.99   ABG No results for input(s): PHART, HCO3 in the last 72 hours.  Invalid input(s): PCO2, PO2  Studies/Results: No results found.  Anti-infectives: Anti-infectives    Start     Dose/Rate Route Frequency Ordered Stop   05/28/15 0800  [MAR Hold]  levofloxacin (LEVAQUIN) IVPB 500 mg     (MAR Hold since 05/30/15 1046)   500 mg 100 mL/hr over 60 Minutes Intravenous Every 24 hours 05/28/15 0028     05/28/15 0600  [MAR Hold]  metroNIDAZOLE (FLAGYL) IVPB 500 mg     (MAR Hold since 05/30/15 1046)   500 mg 100 mL/hr  over 60 Minutes Intravenous Every 8 hours 05/28/15 0028     05/27/15 2030  cefTRIAXone (ROCEPHIN) 1 g in dextrose 5 % 50 mL IVPB     1 g 100 mL/hr over 30 Minutes Intravenous  Once 05/27/15 2029 05/27/15 2131   05/27/15 2030  metroNIDAZOLE (FLAGYL) IVPB 500 mg     500 mg 100 mL/hr over 60 Minutes Intravenous  Once 05/27/15 2029 05/27/15 2245      Assessment/Plan: Ruq/epigastric pain Cholelithiasis "thickended bile duct on imaging"  With LFTs decreasing, doubt pt has underlying bile duct malignancy. No evidence of stricture on imaging. No dilatation. More suggestive of chronic cholecystitis. Hepatitis panel neg  rec proceed with Lap chole with IOC Discussed possibility of not being able to do intra-op IOC for technical reasons therefore may need addl imaging or labs postop.   I discussed laparoscopic cholecystectomy with IOC in detail.  The patient wasshown diagrams detailing the procedure.  We discussed the risks and benefits of a laparoscopic cholecystectomy including, but not limited to bleeding, infection, injury to surrounding structures such as the intestine or liver, bile leak, retained gallstones, need to convert to an open procedure, prolonged diarrhea, blood clots such as  DVT, common bile duct injury, anesthesia risks, and possible need for   additional procedures.  We discussed the typical post-operative recovery course. I explained that the likelihood of improvement of their symptoms is good.  All questions asked and answered. Family at bedside  Euleta Belson M. Murrel Freet, MD, FACS General, Bariatric, & Minimally Invasive Surgery [facility] [place]   s/p Procedure(s): LAPAROSCOPIC CHOLECYSTECTOMY WITH INTRAOPERATIVE CHOLANGIOGRAM (N/A)    LOS: 3 days    Chealsea Paske M 05/30/2015  

## 2015-05-30 NOTE — Progress Notes (Signed)
Patient in the OR

## 2015-05-30 NOTE — Care Management Important Message (Signed)
Important Message  Patient Details  Name: Travis Lawrence MRN: [id number] Date of Birth: 10-05-1956   Medicare Important Message Given:  Yes-second notification given    Renie Ora 05/30/2015, 2:30 PMImportant Message  Patient Details  Name: Travis Lawrence MRN: [id number] Date of Birth: 03-23-1956   Medicare Important Message Given:  Yes-second notification given    Renie Ora 05/30/2015, 2:30 PM

## 2015-05-31 ENCOUNTER — Encounter (HOSPITAL_COMMUNITY): Payer: Self-pay | Admitting: General Surgery

## 2015-05-31 LAB — COMPREHENSIVE METABOLIC PANEL
ALT: 95 U/L — ABNORMAL HIGH (ref 17–63)
AST: 78 U/L — ABNORMAL HIGH (ref 15–41)
Albumin: 2.8 g/dL — ABNORMAL LOW (ref 3.5–5.0)
Alkaline Phosphatase: 292 U/L — ABNORMAL HIGH (ref 38–126)
Anion gap: 6 (ref 5–15)
BUN: 8 mg/dL (ref 6–20)
CO2: 26 mmol/L (ref 22–32)
Calcium: 8.8 mg/dL — ABNORMAL LOW (ref 8.9–10.3)
Chloride: 108 mmol/L (ref 101–111)
Creatinine, Ser: 0.88 mg/dL (ref 0.61–1.24)
GFR calc Af Amer: >60 mL/min (ref >60)
GFR calc non Af Amer: >60 mL/min (ref >60)
Glucose, Bld: 110 mg/dL — ABNORMAL HIGH (ref 65–99)
Potassium: 3.9 mmol/L (ref 3.5–5.1)
Sodium: 140 mmol/L (ref 135–145)
Total Bilirubin: 1.3 mg/dL — ABNORMAL HIGH (ref 0.3–1.2)
Total Protein: 6.3 g/dL — ABNORMAL LOW (ref 6.5–8.1)

## 2015-05-31 LAB — GLUCOSE, CAPILLARY: Glucose-Capillary: 139 mg/dL — ABNORMAL HIGH (ref 65–99)

## 2015-05-31 LAB — CBC
HCT: 41 % (ref 39.0–52.0)
Hemoglobin: 13.6 g/dL (ref 13.0–17.0)
MCH: 31.9 pg (ref 26.0–34.0)
MCHC: 33.2 g/dL (ref 30.0–36.0)
MCV: 96 fL (ref 78.0–100.0)
Platelets: 411 10*3/uL — ABNORMAL HIGH (ref 150–400)
RBC: 4.27 MIL/uL (ref 4.22–5.81)
RDW: 13.7 % (ref 11.5–15.5)
WBC: 10.6 10*3/uL — ABNORMAL HIGH (ref 4.0–10.5)

## 2015-05-31 NOTE — Op Note (Signed)
NAMETRAEVON, MEIRING NO.:  [id number]  MEDICAL RECORD NO.:  [id number]  LOCATION:  1336                         FACILITY:  [facility] [place] [facility]  PHYSICIAN:  Mary Sella. Andrey Campanile, MD, FACSDATE OF BIRTH:  14-Mar-1956  DATE OF PROCEDURE:  05/30/2015 DATE OF DISCHARGE:                              OPERATIVE REPORT   PREOPERATIVE DIAGNOSIS:  Gallstone pancreatitis.  POSTOPERATIVE DIAGNOSIS:  Chronic calculus, cholecystitis.  PROCEDURE:  Laparoscopic partial cholecystectomy.  SURGEON:  Mary Sella. Andrey Campanile, MD, FACS  ASSISTANT SURGEON:  Thornton Park. Daphine Deutscher, M.D.  ANESTHESIA:  General and local as 0.25% Marcaine with epi.  SPECIMENS:  Partial gallbladder.  ESTIMATED BLOOD LOSS:  Less than 50 mL.  FINDINGS:  The patient had a contracted gallbladder that had dense woody peritoneum over the base of the gallbladder at the infundibulum and node of [place].  Despite multiple different approaches, dome down, as well as tediously dissecting, I was not comfortable proceeding with more proximal dissection.  It was unclear the exact location of the cystic duct.  Therefore, I stapled across the body of the gallbladder above the infundibulum with the Echelon 60 stapler with a green load.  The surgical drain was left in the gallbladder fossa.  INDICATIONS FOR PROCEDURE:  The patient was admitted on May 27, 2015, with elevated LFTs and elevated lipase, but the CT scan showing a possibly thickened bile duct, but there was no evidence of biliary stricture.  His LFTs were improving since they were normalizing.  After discussion with GI, we decided to proceed with surgery with plans to intraoperative cholangiogram since his LFTs were improving.  He was pain free at this point.  He had no abdominal pain.  I discussed at length with him and his family regarding risks and benefits of surgery including, but not limited to, bleeding, infection, injury to surrounding structures, need to convert to an open  procedure, bile leak, bile duct injury, prolonged hospitalization, blood clot formation, need for additional procedure, perioperative cardiac and pulmonary issues, incisional hernia, spilled gallstones, and retained gallstones.  We also talked about the possibility of not being able to do an intraoperative cholangiogram due to technical reasons.  We discussed the postop course if that should happen.  DESCRIPTION OF PROCEDURE:  After obtaining informed consent, he was taken to the OR #1 at [facility].  Placed supine on the operating table.  General endotracheal anesthesia was established.  SCDs were placed.  His abdomen was prepped and draped in usual standard surgical fashion.  He was on therapeutic antibiotics.  A surgical time- out was performed.  Local was infiltrated at the base of the umbilicus. The umbilical fascia was grasped and incised with a 15 blade.  The abdominal cavity was entered.  A pursestring sutures placed around the fascial edges with 0 Vicryl.  A 12 mm Hasson trocar was placed and pneumoperitoneum was smoothly established up to a pressure of 15 mmHg. The patient was placed in reverse Trendelenburg and rotated to the left. The gallbladder was grasped and retracted to the right shoulder.  There was omentum stuck to the body of the gallbladder.  This was peeled using combination of blunt dissection with and without  electrocautery.  The gallbladder itself was contracted.  It really was not intrahepatic. There was some overlying omentum which was stripped with and without electrocautery.  I then started taking down some of the peritoneum on the lateral aspect of the gallbladder to achieve some traction on it. These were just thin fibrous bands near the infundibulum.  These were tediously teased out.  We could see through them as I came around each of them with [facility], some were clipped and then some were Bovie'd.  I decided to start doing a dome down  approach and continue to not make a lot of progress down at the infundibulum and the triangle. The gallbladder was mobilized from the right side of the liver.  We did get into the liver capsule some.  The posterior wall of the gallbladder was adhered to the liver capsule.  We did get a little bit into the gallbladder with a few stones which were manually extracted.  After about an hour and 15 to 20 minutes of dissecting and consultation with Dr. Daphine Deutscher, I elected not to do any more proximal dissection.  I was able to identify the cystic artery.  I circumferentially dissected it out and around.  It was clearly going into the gallbladder.  It had pulse within it.  It was ligated with 2 clips proximally, 1 distally as it entered the gallbladder.  The remaining triangle was just woody, and I felt it was not safe proceeding any further proximally.  Therefore, I elected to come across slightly around the infundibulum with the Echelon stapler with a green load stapler, ensuring there were no clips within the stapler fire.  I came across the infundibulum with a green load of the Echelon 60.  The staple line appeared intact.  Hemostasis was achieved.  I had up sized the epigastric trocar to a 12-mm trocar to accommodate the stapler.  An Eco bag was placed in the abdomen and the gallbladder was placed within the Eco bag and removed from the abdominal cavity.  The right upper quadrant was irrigated.  Hemostasis was achieved.  I did elect to leave the surgical drain in the gallbladder fossa.  A 19-French round Harrison Mons drain was introduced through the epigastric trocar and then brought out through the right lateral abdominal wall trocar and secured to the skin with 2-0 nylon.  The drain was placed in the gallbladder fossa.  The Hasson trocar was removed and the previously placed pursestring suture was tied down, thus obliterating the fascial defect.  I did place an additional 0 Vicryl at the umbilical  fascia.  There was no air leak.  I then brought the fascia together and epigastric trocar location with 0 Vicryl.  Skin incisions were closed with 4-0 Monocryl followed by application of benzoin, Steri- Strips, and sterile bandages.  All needle, instrument, sponge counts were correct x2.  The patient tolerated the procedure well.  There were no immediate complications.  The patient tolerated the procedure well.     Mary Sella. Andrey Campanile, MD, FACS     EMW/MEDQ  D:  05/30/2015  T:  05/31/2015  Job:  [id number]

## 2015-05-31 NOTE — Progress Notes (Signed)
Patient ID: Travis Lawrence, male   DOB: Feb 04, 1956, 59 y.o.   MRN: [id number] 1 Day Post-Op  Subjective: Pt actually feels fairly well today.  Not having much pain.  Tolerating full liquids for breakfast.  Objective: Vital signs in last 24 hours: Temp:  [97.5 F (36.4 C)-98 F (36.7 C)] 97.7 F (36.5 C) (08/02 0605) Pulse Rate:  [54-96] 61 (08/02 0605) Resp:  [12-20] 20 (08/02 0605) BP: (113-142)/(73-99) 120/76 mmHg (08/02 0605) SpO2:  [92 %-100 %] 96 % (08/02 0605) Last BM Date: 05/28/15  Intake/Output from previous day: 08/01 0701 - 08/02 0700 In: 2265 [P.O.:240; I.V.:2025] Out: 1295 [Urine:1250; Drains:45] Intake/Output this shift:    PE: Abd: soft, +BS, ND, incisions c/d/i, minimally tender, JP drain with serosang output Heart: regular Lungs: CTAB  Lab Results:   Recent Labs  05/29/15 0424 05/31/15 0416  WBC 6.0 10.6*  HGB 14.1 13.6  HCT 41.2 41.0  PLT 385 411*   BMET  Recent Labs  05/30/15 0431 05/31/15 0416  NA 139 140  K 4.0 3.9  CL 109 108  CO2 23 26  GLUCOSE 118* 110*  BUN 9 8  CREATININE 0.95 0.88  CALCIUM 9.2 8.8*   PT/INR No results for input(s): LABPROT, INR in the last 72 hours. CMP     Component Value Date/Time   NA 140 05/31/2015 0416   K 3.9 05/31/2015 0416   CL 108 05/31/2015 0416   CO2 26 05/31/2015 0416   GLUCOSE 110* 05/31/2015 0416   BUN 8 05/31/2015 0416   CREATININE 0.88 05/31/2015 0416   CREATININE 0.77 05/26/2015 1339   CALCIUM 8.8* 05/31/2015 0416   PROT 6.3* 05/31/2015 0416   ALBUMIN 2.8* 05/31/2015 0416   AST 78* 05/31/2015 0416   ALT 95* 05/31/2015 0416   ALKPHOS 292* 05/31/2015 0416   BILITOT 1.3* 05/31/2015 0416   GFRNONAA >60 05/31/2015 0416   GFRNONAA >89 05/26/2015 1339   GFRAA >60 05/31/2015 0416   GFRAA >89 05/26/2015 1339   Lipase     Component Value Date/Time   LIPASE 111* 05/29/2015 0424       Studies/Results: No results found.  Anti-infectives: Anti-infectives    Start     Dose/Rate  Route Frequency Ordered Stop   05/31/15 0800  levofloxacin (LEVAQUIN) IVPB 500 mg     500 mg 100 mL/hr over 60 Minutes Intravenous Every 24 hours 05/30/15 1734 06/01/15 0759   05/30/15 2200  metroNIDAZOLE (FLAGYL) IVPB 500 mg     500 mg 100 mL/hr over 60 Minutes Intravenous Every 8 hours 05/30/15 1734 05/31/15 2159   05/28/15 0800  levofloxacin (LEVAQUIN) IVPB 500 mg  Status:  Discontinued     500 mg 100 mL/hr over 60 Minutes Intravenous Every 24 hours 05/28/15 0028 05/30/15 1734   05/28/15 0600  metroNIDAZOLE (FLAGYL) IVPB 500 mg  Status:  Discontinued     500 mg 100 mL/hr over 60 Minutes Intravenous Every 8 hours 05/28/15 0028 05/30/15 1734   05/27/15 2030  cefTRIAXone (ROCEPHIN) 1 g in dextrose 5 % 50 mL IVPB     1 g 100 mL/hr over 30 Minutes Intravenous  Once 05/27/15 2029 05/27/15 2131   05/27/15 2030  metroNIDAZOLE (FLAGYL) IVPB 500 mg     500 mg 100 mL/hr over 60 Minutes Intravenous  Once 05/27/15 2029 05/27/15 2245       Assessment/Plan  POD 1, s/p subtotal cholecystectomy -follow LFTs, slightly up secondary to surgery.  Check in am -drain without bile currently.  Cont this and follow -advance to low fat diet today. -mobilize and pulm toilet -DVT proph: Heparin/SCDs -levaquin and flagyl finishing up today -oral pain meds -likely home tomorrow  LOS: 4 days    Travis Lawrence E 05/31/2015, 8:23 AM Pager: [phone]

## 2015-05-31 NOTE — Progress Notes (Signed)
Progress Note   Travis Lawrence:[id number] DOB: 05-20-1956 DOA: 05/27/2015 PCP: No primary care provider on file.   Brief Narrative:   Travis Lawrence is an 59 y.o. male the PMH of hypertension, hypertriglyceridemia, GERD and schizophrenia who was admitted 05/27/15 with a chief complaint of a 4-5 day history of abdominal pain. He was evaluated at an urgent care center and had a CT scan of the abdomen which showed evidence of pancreatitis and cholangitis. His liver enzymes were elevated and he subsequently was referred to the ER for further evaluation.  Assessment/Plan:   Principal Problem:   Pancreatitis, acute / cholangitis / cholelithiasis with abdominal pain - CT done 05/27/15: Mild peripancreatic inflammation compatible with pancreatitis. Lipase elevated. - CT also showed: Enhancement of the CBD and main hepatic ducts compatible with cholangitis. - Abdominal ultrasound showed cholelithiasis in a contracted gallbladder.-  - s/p floxacillin and Flagyl for treatment of cholangitis. - negative HIV and hepatitis serologies. -GI and surgery consulted. S/p partial cholecystectomy on 8/1, s/p drain, improving  Active Problems:   HYPERTRIGLYCERIDEMIA - Triglycerides were 217 on 05/26/15. Continue gemfibrozil.    Paranoid schizophrenia - Receives monthly Haldol injections, last dose approximately 2 weeks ago.    Essential hypertension, benign - Continue lisinopril.    GERD - Continue Pepcid.    DVT Prophylaxis - SCDs ordered.  Family Communication: no family in room today Disposition Plan: Home, likely tomorrow Code Status:     Code Status Orders        Start     Ordered   05/28/15 0028  Full code   Continuous     05/28/15 0028        IV Access:    Peripheral IV   Procedures and diagnostic studies:   No results found.   Medical Consultants:    Gastroenterology  General Surgery  Anti-Infectives:    Levofloxacin 05/27/15--->8/1  Flagyl  05/27/15--->8/1  Subjective:   [name] pod#1, drain in place, denies abdominal pain.  No nausea or vomiting. No dyspnea. Tolerating diet  Objective:    Filed Vitals:   05/30/15 1826 05/30/15 2120 05/31/15 0605 05/31/15 1341  BP: 113/83 139/85 120/76 134/85  Pulse: 62 54 61 84  Temp: 97.7 F (36.5 C) 97.9 F (36.6 C) 97.7 F (36.5 C) 98.1 F (36.7 C)  TempSrc:  Axillary Oral Oral  Resp: 16 20 20 20   Height:      Weight:      SpO2: 97% 97% 96% 97%    Intake/Output Summary (Last 24 hours) at 05/31/15 1726 Last data filed at 05/31/15 1143  Gross per 24 hour  Intake    240 ml  Output   2650 ml  Net  -2410 ml    Exam: Gen:  NAD Cardiovascular:  RRR, No M/R/G Respiratory:  Lungs CTAB Gastrointestinal:  Abdomen soft, NT/ND, + BS, post op changes, Jp drain with serosanguinous fluids Extremities:  No C/E/C   Data Reviewed:    Labs: Basic Metabolic Panel:  Recent Labs Lab 05/26/15 1339 05/28/15 0100 05/29/15 0424 05/30/15 0431 05/31/15 0416  NA 136 138 139 139 140  K 3.7 4.0 4.0 4.0 3.9  CL 102 107 109 109 108  CO2 22 22 22 23 26   GLUCOSE 91 122* 110* 118* 110*  BUN 12 6 9 9 8   CREATININE 0.77 0.84 0.97 0.95 0.88  CALCIUM 9.2 8.9 9.0 9.2 8.8*   GFR Estimated Creatinine Clearance: 94.9 mL/min (by C-G formula based  on Cr of 0.88). Liver Function Tests:  Recent Labs Lab 05/26/15 1339 05/28/15 0100 05/29/15 0424 05/30/15 0431 05/31/15 0416  AST 61* 45* 34 44* 78*  ALT 129* 96* 74* 76* 95*  ALKPHOS 583* 442* 384* 352* 292*  BILITOT 2.5* 1.7* 1.8* 1.3* 1.3*  PROT 7.0 7.3 6.8 6.8 6.3*  ALBUMIN 3.9 3.2* 2.9* 2.9* 2.8*    Recent Labs Lab 05/26/15 1339 05/27/15 2117 05/29/15 0424  LIPASE 423* 195* 111*   Coagulation profile  Recent Labs Lab 05/28/15 0100  INR 0.99    CBC:  Recent Labs Lab 05/26/15 1403 05/28/15 0500 05/29/15 0424 05/31/15 0416  WBC 9.7 8.7 6.0 10.6*  HGB 15.2 14.4 14.1 13.6  HCT 44.3 41.1 41.2 41.0  MCV  94.0 93.2 95.6 96.0  PLT  --  335 385 411*   CBG:  Recent Labs Lab 05/28/15 0726 05/29/15 0729 05/30/15 0747 05/31/15 0754  GLUCAP 108* 106* 125* 139*   Lipid Profile: No results for input(s): CHOL, HDL, LDLCALC, TRIG, CHOLHDL, LDLDIRECT in the last 72 hours. Thyroid function studies: No results for input(s): TSH, T4TOTAL, T3FREE, THYROIDAB in the last 72 hours.  Invalid input(s): FREET3 Sepsis Labs:  Recent Labs Lab 05/26/15 1403 05/27/15 2123 05/28/15 0500 05/29/15 0424 05/31/15 0416  WBC 9.7  --  8.7 6.0 10.6*  LATICACIDVEN  --  0.93  --   --   --    Microbiology Recent Results (from the past 240 hour(s))  Culture, blood (routine x 2)     Status: None (Preliminary result)   Collection Time: 05/27/15  8:55 PM  Result Value Ref Range Status   Specimen Description LEFT ANTECUBITAL  Final   Special Requests BOTTLES DRAWN AEROBIC ONLY 5CC  Final   Culture   Final    NO GROWTH 3 DAYS Performed at [facility]    Report Status PENDING  Incomplete  Culture, blood (routine x 2)     Status: None (Preliminary result)   Collection Time: 05/27/15  9:09 PM  Result Value Ref Range Status   Specimen Description BLOOD RIGHT HAND  Final   Special Requests BOTTLES DRAWN AEROBIC ONLY 4CC  Final   Culture   Final    NO GROWTH 3 DAYS Performed at [facility]    Report Status PENDING  Incomplete  Surgical pcr screen     Status: None   Collection Time: 05/29/15  7:53 PM  Result Value Ref Range Status   MRSA, PCR NEGATIVE NEGATIVE Final   Staphylococcus aureus NEGATIVE NEGATIVE Final    Comment:        The Xpert SA Assay (FDA approved for NASAL specimens in patients over 31 years of age), is one component of a comprehensive surveillance program.  Test performance has been validated by [facility] for patients greater than or equal to 65 year old. It is not intended to diagnose infection nor to guide or monitor treatment.      Medications:   .  famotidine  20 mg Oral BID  . gemfibrozil  600 mg Oral BID AC  . heparin subcutaneous  5,000 Units Subcutaneous 3 times per day  . lisinopril  10 mg Oral Daily   Continuous Infusions:    Time spent: 25 minutes .   LOS: 4 days   Masud Holub  [facility] Pager [phone].   *Please refer to [email or web address], password [email or web address] to get updated schedule on who will round on this patient, as hospitalists switch teams weekly. If 7PM-7AM,  please contact night-coverage at [email or web address], password [email or web address] for any overnight needs.  05/31/2015, 5:26 PM

## 2015-06-01 DIAGNOSIS — I10 Essential (primary) hypertension: Secondary | ICD-10-CM

## 2015-06-01 DIAGNOSIS — E781 Pure hyperglyceridemia: Secondary | ICD-10-CM

## 2015-06-01 DIAGNOSIS — F200 Paranoid schizophrenia: Secondary | ICD-10-CM

## 2015-06-01 LAB — COMPREHENSIVE METABOLIC PANEL
ALT: 73 U/L — ABNORMAL HIGH (ref 17–63)
AST: 40 U/L (ref 15–41)
Albumin: 2.9 g/dL — ABNORMAL LOW (ref 3.5–5.0)
Alkaline Phosphatase: 289 U/L — ABNORMAL HIGH (ref 38–126)
Anion gap: 8 (ref 5–15)
BUN: 11 mg/dL (ref 6–20)
CO2: 25 mmol/L (ref 22–32)
Calcium: 8.7 mg/dL — ABNORMAL LOW (ref 8.9–10.3)
Chloride: 106 mmol/L (ref 101–111)
Creatinine, Ser: 1.18 mg/dL (ref 0.61–1.24)
GFR calc Af Amer: >60 mL/min (ref >60)
GFR calc non Af Amer: >60 mL/min (ref >60)
Glucose, Bld: 106 mg/dL — ABNORMAL HIGH (ref 65–99)
Potassium: 4.1 mmol/L (ref 3.5–5.1)
Sodium: 139 mmol/L (ref 135–145)
Total Bilirubin: 1.1 mg/dL (ref 0.3–1.2)
Total Protein: 6.3 g/dL — ABNORMAL LOW (ref 6.5–8.1)

## 2015-06-01 MED ORDER — OXYCODONE HCL 5 MG PO TABS: 5.0000 mg | ORAL_TABLET | Freq: Four times a day (QID) | ORAL | Status: DC | PRN

## 2015-06-01 NOTE — Discharge Summary (Addendum)
Discharge Summary  Travis Lawrence BJY:[id number] DOB: 07-12-1956  PCP: No primary care provider on file.  Admit date: 05/27/2015 Discharge date: 06/01/2015  Time spent: <37mins  Recommendations for Outpatient Follow-up:  1. F/u with PMD within two weeks, pmd to continue monitor cholesterol level, and medication adjustment 2. F/u with general surgery, drain care per general surgery   Discharge Diagnoses:  Active Hospital Problems   Diagnosis Date Noted  . Pancreatitis, acute 05/28/2015  . Elevated LFTs   . Cholelithiasis 05/28/2015  . Abdominal pain 05/27/2015  . Essential hypertension, benign 01/23/2008  . HYPERTRIGLYCERIDEMIA 06/24/2007  . Paranoid schizophrenia 06/23/2007  . GERD 06/23/2007    Resolved Hospital Problems   Diagnosis Date Noted Date Resolved  . Pancreatitis 05/27/2015 05/28/2015    Discharge Condition: stable  Diet recommendation: heart healthy  Filed Weights   05/27/15 2014 05/28/15 0023  Weight: 77.111 kg (170 lb) 74.208 kg (163 lb 9.6 oz)    History of present illness:  Travis Lawrence is a 59 y.o. male with PMH of hypertension, hypertriglyceridemia, GERD, schizophrenia, who presents with abdominal pain.  Patient reports that he has been having abdominal pain in the past 4-5 days. The pain is located in the periumbilical area. His abdominal pain was initially constant, 7 out of 10 in severity, nonradiating. It is not aggravated or deviated any known factors. His abdominal pain has spontaneously subsided. Currently his pain is minimal. She does not have fever, but has chills. No diarrhea. It is associated with nausea, and vomiting. He vomited once 2 days ago. Patient does not have symptoms of UTI, no chest pain, cough, shortness of breath, unilateral weakness, rashes.  He went to his primary care physician at the urgent care center and had lab work and a CT ordered. His lab work showed elevated liver enzymes. He had CT scan which showed evidence of  pancreatitis and cholangitis. He was sent here for further evaluation.   In ED, patient was found to have abnormal liver function with ALP 583, AST 61, ALT 129, total bilirubin 2.5. WBC 9.7, temperature normal, no tachycardia, electrolytes okay, lactate 0.93, lipase 125, urinalysis negative. Patient is admitted to inpatient for further evaluation and treatment. GI and general surgery were consulted by ED. Dr. Russella Dar and Dr. Ezzard Standing will see pt.  Hospital Course:  Principal Problem:   Pancreatitis, acute Active Problems:   HYPERTRIGLYCERIDEMIA   Paranoid schizophrenia   Essential hypertension, benign   GERD   Abdominal pain   Cholelithiasis   Elevated LFTs  Pancreatitis, acute / cholangitis / cholelithiasis with abdominal pain - CT done 05/27/15: Mild peripancreatic inflammation compatible with pancreatitis. Lipase elevated. - CT also showed: Enhancement of the CBD and main hepatic ducts compatible with cholangitis. - Abdominal ultrasound showed cholelithiasis in a contracted gallbladder.-  - finished floxacillin and Flagyl for treatment of cholangitis. - negative HIV and hepatitis serologies. -GI and surgery consulted. S/p partial cholecystectomy on 8/1, s/p drain, improving -drain care, post op follow up per general surgery  Active Problems:  HYPERTRIGLYCERIDEMIA - Triglycerides were 217 on 05/26/15.  Patient reported his pmd has stopped gemfibrozil. Patient is to follow up with pmd to discuss further treatment   Paranoid schizophrenia - Receives monthly Haldol injections, last dose approximately 2 weeks ago.   Essential hypertension, benign - Continue lisinopril.   GERD - Continue Pepcid.    Family Communication: no family in room today Disposition Plan: Home Code Status: full         ABX;  Levofloxacin 05/27/15--->8/1  Flagyl 05/27/15--->8/1  Procedures: Laparoscopic partial cholecystectomy  and placement of a 19-French round Blake drain on 8/1 by Dr. Gaynelle Adu.  Consultations:  General surgery Gastroenterology   Discharge Exam: BP 108/71 mmHg  Pulse 83  Temp(Src) 98.7 F (37.1 C) (Oral)  Resp 16  Ht 5\' 11"  (1.803 m)  Wt 74.208 kg (163 lb 9.6 oz)  BMI 22.83 kg/m2  SpO2 91%  Gen: NAD Cardiovascular: RRR, No M/R/G Respiratory: Lungs CTAB Gastrointestinal: Abdomen soft, NT/ND, + BS, post op changes, drain with serosanguinous fluids Extremities: No C/E/C   Discharge Instructions You were cared for by a hospitalist during your hospital stay. If you have any questions about your discharge medications or the care you received while you were in the hospital after you are discharged, you can call the unit and asked to speak with the hospitalist on call if the hospitalist that took care of you is not available. Once you are discharged, your primary care physician will handle any further medical issues. Please note that NO REFILLS for any discharge medications will be authorized once you are discharged, as it is imperative that you return to your primary care physician (or establish a relationship with a primary care physician if you do not have one) for your aftercare needs so that they can reassess your need for medications and monitor your lab values.      Discharge Instructions    Diet - low sodium heart healthy    Complete by:  As directed      Increase activity slowly    Complete by:  As directed             Medication List    STOP taking these medications        alum & mag hydroxide-simeth 200-200-20 MG/5ML suspension  Commonly known as:  MAALOX/MYLANTA     gemfibrozil 600 MG tablet  Commonly known as:  LOPID     ibuprofen 200 MG tablet  Commonly known as:  ADVIL,MOTRIN     zoster vaccine live (PF) 19400 UNT/0.65ML injection  Commonly known as:  ZOSTAVAX      TAKE these medications        famotidine 20 MG tablet  Commonly known as:  PEPCID  Take 1 tablet (20 mg total) by mouth 2 (two) times daily.      fluticasone 50 MCG/ACT nasal spray  Commonly known as:  FLONASE  Place 2 sprays into both nostrils daily.     haloperidol decanoate 100 MG/ML injection  Commonly known as:  HALDOL DECANOATE  Inject 100 mg into the muscle every 28 (twenty-eight) days.     lisinopril 10 MG tablet  Commonly known as:  PRINIVIL,ZESTRIL  Take 1 tablet (10 mg total) by mouth daily. NO MORE REFILLS WITHOUT OFFICE VISIT - 2ND NOTICE     oxyCODONE 5 MG immediate release tablet  Commonly known as:  Oxy IR/ROXICODONE  Take 1 tablet (5 mg total) by mouth every 6 (six) hours as needed for moderate pain.       Allergies  Allergen Reactions  . Penicillins     REACTION: rash   Follow-up Information    Follow up with LE, THAO PHUONG, DO In 2 weeks.   Specialty:  Family Medicine   Why:  hospital discharge follow up Dr.Lee has walk-in hours on the 17th from 5-8;30. On the 18th 8:30 to noon,Sat the 20th 8:30 to 4. Tues the 23 at 8:30 to noon.Come to the  walkk-in any of those days and ask for Dr. Virgilio Belling information:   [address] [place] [address] [phone]       Follow up with Atilano Ina, MD. Schedule an appointment as soon as possible for a visit in 2 weeks.   Specialty:  General Surgery   Why:  For wound re-check   Contact information:   [address] [place] [address] [phone]       Follow up with [facility] On 06/07/2015.   Specialty:  General Surgery   Why:  10 AM (arrive 9:30 AM) for DRAIN REMOVAL    Contact information:   [address] [place] [address] [phone]        The results of significant diagnostics from this hospitalization (including imaging, microbiology, ancillary and laboratory) are listed below for reference.    Significant Diagnostic Studies: Ct Abdomen Pelvis W Contrast  05/27/2015   CLINICAL DATA:  Diffuse abdominal pain for 1 week.  Abnormal LFTs.  EXAM: CT ABDOMEN AND PELVIS WITH CONTRAST  TECHNIQUE:  Multidetector CT imaging of the abdomen and pelvis was performed using the standard protocol following bolus administration of intravenous contrast.  CONTRAST:  OMNIPAQUE IOHEXOL 300 MG/ML  SOLN  COMPARISON:  None.  FINDINGS: Lower chest:  Unremarkable  Hepatobiliary: The liver is unremarkable. There is wall thickening and enhancement of the common bile duct and main/left/right hepatic ducts, compatible with cholangitis. There is no evidence of biliary dilatation.  Pancreas: There is mild inflammation along the pancreatic body/tail. The pancreas is unremarkable. No pancreatic necrosis, venous thrombosis, hemorrhage or acute fluid collections identified.  Spleen: Unremarkable  Adrenals/Urinary Tract: The kidneys and adrenal glands are unremarkable. Mild circumferential bladder wall thickening is noted.  Stomach/Bowel: There is no evidence of bowel obstruction or focal bowel wall thickening.  Vascular/Lymphatic: No enlarged lymph nodes or abdominal aortic aneurysm. Aortic atherosclerotic calcifications are noted.  Reproductive: Prostate unremarkable.  Other: No free fluid, abscess or pneumoperitoneum.  Musculoskeletal: No acute or suspicious abnormalities moderate degenerative disc disease at L5-S1 identified.  IMPRESSION: Mild peripancreatic inflammation compatible with pancreatitis - correlate with laboratory values. No evidence of pancreatic necrosis, hemorrhage, venous thrombosis or acute fluid collections.  Enhancement of the CBD and main hepatic ducts compatible with cholangitis. No evidence of biliary dilatation.  Aortic atherosclerotic disease.   Electronically Signed   By: Harmon Pier M.D.   On: 05/27/2015 18:52   Dg Abd Acute W/chest  05/26/2015   CLINICAL DATA:  Abdominal discomfort for 5 days, cramping, constipation  EXAM: DG ABDOMEN ACUTE W/ 1V CHEST  COMPARISON:  None.  FINDINGS: No active infiltrate or effusion is seen. Mediastinal and hilar contours are unremarkable. The heart is within normal  limits in size. No bony abnormality is seen.  Supine erect views of the abdomen show no bowel obstruction. No free air is seen on the erect view. No opaque calculi are noted. There are degenerative changes in the lower lumbar spine.  IMPRESSION: 1. No active lung disease. 2. No bowel obstruction.  No free air. 3. No opaque calculi are noted.   Electronically Signed   By: Dwyane Dee M.D.   On: 05/26/2015 14:44   US Abdomen Limited Ruq  05/27/2015   CLINICAL DATA:  Pancreatitis and cholangitis by CT  EXAM: US ABDOMEN LIMITED - RIGHT UPPER QUADRANT  COMPARISON:  CT 05/27/2015  FINDINGS: Gallbladder:  The gallbladder is contracted around multiple calculi. There  is no pericholecystic fluid.  Common bile duct:  Diameter: 5.9 mm  Liver:  Mildly coarsened echotexture without discrete lesion.  IMPRESSION: Cholelithiasis with contracted gallbladder. No pericholecystic fluid. No tenderness over the gallbladder. No bile duct dilatation.   Electronically Signed   By: Ellery Plunk M.D.   On: 05/27/2015 22:26    Microbiology: Recent Results (from the past 240 hour(s))  Culture, blood (routine x 2)     Status: None (Preliminary result)   Collection Time: 05/27/15  8:55 PM  Result Value Ref Range Status   Specimen Description LEFT ANTECUBITAL  Final   Special Requests BOTTLES DRAWN AEROBIC ONLY 5CC  Final   Culture   Final    NO GROWTH 3 DAYS Performed at [facility]    Report Status PENDING  Incomplete  Culture, blood (routine x 2)     Status: None (Preliminary result)   Collection Time: 05/27/15  9:09 PM  Result Value Ref Range Status   Specimen Description BLOOD RIGHT HAND  Final   Special Requests BOTTLES DRAWN AEROBIC ONLY 4CC  Final   Culture   Final    NO GROWTH 3 DAYS Performed at [facility]    Report Status PENDING  Incomplete  Surgical pcr screen     Status: None   Collection Time: 05/29/15  7:53 PM  Result Value Ref Range Status   MRSA, PCR NEGATIVE NEGATIVE Final    Staphylococcus aureus NEGATIVE NEGATIVE Final    Comment:        The Xpert SA Assay (FDA approved for NASAL specimens in patients over 15 years of age), is one component of a comprehensive surveillance program.  Test performance has been validated by [facility] for patients greater than or equal to 9 year old. It is not intended to diagnose infection nor to guide or monitor treatment.      Labs: Basic Metabolic Panel:  Recent Labs Lab 05/28/15 0100 05/29/15 0424 05/30/15 0431 05/31/15 0416 06/01/15 0355  NA 138 139 139 140 139  K 4.0 4.0 4.0 3.9 4.1  CL 107 109 109 108 106  CO2 GLUCOSE 122* 110* 118* 110* 106*  BUN CREATININE 0.84 0.97 0.95 0.88 1.18  CALCIUM 8.9 9.0 9.2 8.8* 8.7*   Liver Function Tests:  Recent Labs Lab 05/28/15 0100 05/29/15 0424 05/30/15 0431 05/31/15 0416 06/01/15 0355  AST 45* 34 44* 78* 40  ALT 96* 74* 76* 95* 73*  ALKPHOS 442* 384* 352* 292* 289*  BILITOT 1.7* 1.8* 1.3* 1.3* 1.1  PROT 7.3 6.8 6.8 6.3* 6.3*  ALBUMIN 3.2* 2.9* 2.9* 2.8* 2.9*    Recent Labs Lab 05/26/15 1339 05/27/15 2117 05/29/15 0424  LIPASE 423* 195* 111*   No results for input(s): AMMONIA in the last 168 hours. CBC:  Recent Labs Lab 05/26/15 1403 05/28/15 0500 05/29/15 0424 05/31/15 0416  WBC 9.7 8.7 6.0 10.6*  HGB 15.2 14.4 14.1 13.6  HCT 44.3 41.1 41.2 41.0  MCV 94.0 93.2 95.6 96.0  PLT  --  335 385 411*   Cardiac Enzymes: No results for input(s): CKTOTAL, CKMB, CKMBINDEX, TROPONINI in the last 168 hours. BNP: BNP (last 3 results) No results for input(s): BNP in the last 8760 hours.  ProBNP (last 3 results) No results for input(s): PROBNP in the last 8760 hours.  CBG:  Recent Labs Lab 05/28/15 0726 05/29/15 0729 05/30/15 0747 05/31/15 0754  GLUCAP 108* 106* 125* 139*  SignedAlbertine Grates MD, PhD  [facility] 06/01/2015, 1:01 PM

## 2015-06-01 NOTE — Discharge Instructions (Signed)
[facility] LAPAROSCOPIC SURGERY: POST OP INSTRUCTIONS Always review your discharge instruction sheet given to you by the facility where your surgery was performed. IF YOU HAVE DISABILITY OR FAMILY LEAVE FORMS, YOU MUST BRING THEM TO THE OFFICE FOR PROCESSING.   DO NOT GIVE THEM TO YOUR DOCTOR.  1. A prescription for pain medication may be given to you upon discharge.  Take your pain medication as prescribed, if needed.  If narcotic pain medicine is not needed, then you may take acetaminophen (Tylenol) or ibuprofen (Advil) as needed. 2. Take your usually prescribed medications unless otherwise directed. 3. If you need a refill on your pain medication, please contact your pharmacy.  They will contact our office to request authorization. Prescriptions will not be filled after 5pm or on week-ends. 4. You should follow a light diet the first few days after arrival home, such as soup and crackers, etc.  Be sure to include lots of fluids daily. 5. Most patients will experience some swelling and bruising in the area of the incisions.  Ice packs will help.  Swelling and bruising can take several days to resolve.  6. It is common to experience some constipation if taking pain medication after surgery.  Increasing fluid intake and taking a stool softener (such as Colace) will usually help or prevent this problem from occurring.  A mild laxative (Milk of Magnesia or Miralax) should be taken according to package instructions if there are no bowel movements after 48 hours. 7.  uou may shower.  You have steri-strips (small skin tapes) in place directly over the incision.  These strips should be left on the skin for 7-10 days.  If your surgeon used skin glue on the incision, you may shower in 24 hours.  The glue will flake off over the next 2-3 weeks.  Any sutures or staples will be removed at the office during your follow-up visit. 8. ACTIVITIES:  You may resume regular (light) daily activities  beginning the next day--such as daily self-care, walking, climbing stairs--gradually increasing activities as tolerated.  You may have sexual intercourse when it is comfortable.  Refrain from any heavy lifting or straining until approved by your doctor. a. You may drive when you are no longer taking prescription pain medication, you can comfortably wear a seatbelt, and you can safely maneuver your car and apply brakes. 9. You should see your doctor in the office for a follow-up appointment approximately 2-3 weeks after your surgery.  Make sure that you call for this appointment within a day or two after you arrive home to insure a convenient appointment time. 10. OTHER INSTRUCTIONS:  WHEN TO CALL YOUR DOCTOR: 1. Fever over 101.0 2. Inability to urinate 3. Continued bleeding from incision. 4. Increased pain, redness, or drainage from the incision. 5. Increasing abdominal pain  The clinic staff is available to answer your questions during regular business hours.  Please dont hesitate to call and ask to speak to one of the nurses for clinical concerns.  If you have a medical emergency, go to the nearest emergency room or call 911.  A surgeon from [facility] is always on call at the hospital. [address], [address], [place], [place]  [address] ? [address] [address], [place], [place]   [address] [phone] ? [phone] ? FAX [phone] Web site: [email or web address]  Bulb Drain Home Care A bulb drain consists of a thin rubber tube and a soft, round bulb that creates a gentle suction.  The rubber tube is placed in the area where you had surgery. A bulb is attached to the end of the tube that is outside the body. The bulb drain removes excess fluid that normally builds up in a surgical wound after surgery. The color and amount of fluid will vary. Immediately after surgery, the fluid is bright red and is a little thicker than water. It may gradually change to a yellow or pink  color and become more thin and water-like. When the amount decreases to about 1 or 2 tbsp in 24 hours, your health care provider will usually remove it. DAILY CARE  Keep the bulb flat (compressed) at all times, except while emptying it. The flatness creates suction. You can flatten the bulb by squeezing it firmly in the middle and then closing the cap.  Keep sites where the tube enters the skin dry and covered with a bandage (dressing).  Secure the tube 1-2 in (2.5-5.1 cm) below the insertion sites to keep it from pulling on your stitches. The tube is stitched in place and will not slip out.  Secure the bulb as directed by your health care provider.  For the first 3 days after surgery, there usually is more fluid in the bulb. Empty the bulb whenever it becomes half full because the bulb does not create enough suction if it is too full. The bulb could also overflow. Write down how much fluid you remove each time you empty your drain. Add up the amount removed in 24 hours.  Empty the bulb at the same time every day once the amount of fluid decreases and you only need to empty it once a day. Write down the amounts and the 24-hour totals to give to your health care provider. This helps your health care provider know when the tubes can be removed. EMPTYING THE BULB DRAIN Before emptying the bulb, get a measuring cup, a piece of paper and a pen, and wash your hands.  Gently run your fingers down the tube (stripping) to empty any drainage from the tubing into the bulb. This may need to be done several times a day to clear the tubing of clots and tissue.  Open the bulb cap to release suction, which causes it to inflate. Do not touch the inside of the cap.  Gently run your fingers down the tube (stripping) to empty any drainage from the tubing into the bulb.  Hold the cap out of the way, and pour fluid into the measuring cup.   Squeeze the bulb to provide suction.  Replace the cap.   Check the  tape that holds the tube to your skin. If it is becoming loose, you can remove the loose piece of tape and apply a new one. Then, pin the bulb to your shirt.   Write down the amount of fluid you emptied out. Write down the date and each time you emptied your bulb drain. (If there are 2 bulbs, note the amount of drainage from each bulb and keep the totals separate. Your health care provider will want to know the total amounts for each drain and which tube is draining more.)   Flush the fluid down the toilet and wash your hands.   Call your health care provider once you have less than 2 tbsp of fluid collecting in the bulb drain every 24 hours. If there is drainage around the tube site, change dressings and keep the area dry. Cleanse around tube with sterile saline and place  dry gauze around site. This gauze should be changed when it is soiled. If it stays clean and unsoiled, it should still be changed daily.  SEEK MEDICAL CARE IF:  Your drainage has a bad smell or is cloudy.   You have a fever.   Your drainage is increasing instead of decreasing.   Your tube fell out.   You have redness or swelling around the tube site.   You have drainage from a surgical wound.   Your bulb drain will not stay flat after you empty it.  MAKE SURE YOU:   Understand these instructions.  Will watch your condition.  Will get help right away if you are not doing well or get worse. Document Released: 10/12/2000 Document Revised: 03/01/2014 Document Reviewed: 03/19/2012 [facility] Patient Information 2015 [place], [place]. This information is not intended to replace advice given to you by your health care provider. Make sure you discuss any questions you have with your health care provider.

## 2015-06-01 NOTE — Progress Notes (Signed)
2 Days Post-Op  Subjective: No n/v. Tolerating diet. Ambulating. Min pain  Objective: Vital signs in last 24 hours: Temp:  [98 F (36.7 C)-98.7 F (37.1 C)] 98.7 F (37.1 C) (08/03 0515) Pulse Rate:  [73-84] 83 (08/03 0515) Resp:  [16-20] 16 (08/03 0515) BP: (108-134)/(71-89) 108/71 mmHg (08/03 0515) SpO2:  [91 %-97 %] 91 % (08/03 0515) Last BM Date: 05/31/15  Intake/Output from previous day: 08/02 0701 - 08/03 0700 In: 960 [P.O.:960] Out: 3165 [Urine:3125; Drains:40] Intake/Output this shift:    Alert, nontoxic, not ill appearing Reg Soft, nd, min TTP,  Drain-serosang No edema  Lab Results:   Recent Labs  05/31/15 0416  WBC 10.6*  HGB 13.6  HCT 41.0  PLT 411*   BMET  Recent Labs  05/31/15 0416 06/01/15 0355  NA 140 139  K 3.9 4.1  CL 108 106  CO2 26 25  GLUCOSE 110* 106*  BUN 8 11  CREATININE 0.88 1.18  CALCIUM 8.8* 8.7*   Hepatic Function Latest Ref Rng 06/01/2015 05/31/2015 05/30/2015  Total Protein 6.5 - 8.1 g/dL 6.3(L) 6.3(L) 6.8  Albumin 3.5 - 5.0 g/dL 2.9(L) 2.8(L) 2.9(L)  AST 15 - 41 U/L 40 78(H) 44(H)  ALT 17 - 63 U/L 73(H) 95(H) 76(H)  Alk Phosphatase 38 - 126 U/L 289(H) 292(H) 352(H)  Total Bilirubin 0.3 - 1.2 mg/dL 1.1 1.3(H) 1.3(H)     PT/INR No results for input(s): LABPROT, INR in the last 72 hours. ABG No results for input(s): PHART, HCO3 in the last 72 hours.  Invalid input(s): PCO2, PO2  Studies/Results: No results found.  Anti-infectives: Anti-infectives    Start     Dose/Rate Route Frequency Ordered Stop   05/31/15 0800  levofloxacin (LEVAQUIN) IVPB 500 mg     500 mg 100 mL/hr over 60 Minutes Intravenous Every 24 hours 05/30/15 1734 05/31/15 0857   05/30/15 2200  metroNIDAZOLE (FLAGYL) IVPB 500 mg     500 mg 100 mL/hr over 60 Minutes Intravenous Every 8 hours 05/30/15 1734 05/31/15 1531   05/28/15 0800  levofloxacin (LEVAQUIN) IVPB 500 mg  Status:  Discontinued     500 mg 100 mL/hr over 60 Minutes Intravenous Every 24  hours 05/28/15 0028 05/30/15 1734   05/28/15 0600  metroNIDAZOLE (FLAGYL) IVPB 500 mg  Status:  Discontinued     500 mg 100 mL/hr over 60 Minutes Intravenous Every 8 hours 05/28/15 0028 05/30/15 1734   05/27/15 2030  cefTRIAXone (ROCEPHIN) 1 g in dextrose 5 % 50 mL IVPB     1 g 100 mL/hr over 30 Minutes Intravenous  Once 05/27/15 2029 05/27/15 2131   05/27/15 2030  metroNIDAZOLE (FLAGYL) IVPB 500 mg     500 mg 100 mL/hr over 60 Minutes Intravenous  Once 05/27/15 2029 05/27/15 2245      Assessment/Plan: s/p Procedure(s): LAPAROSCOPIC SUBTOTAL CHOLECYSTECTOMY   (N/A) Doing well No evidence of bile leak LFTs cont to trend down; will repeat set as outpt Ok for dc to home from my point of view  Discussed dc instructions Will need nurse only visit next week in our office for drain removal and LFTs F/u with me 2 weeks  Leighton Ruff. Redmond Pulling, MD, FACS General, Bariatric, & Minimally Invasive Surgery [facility], [place]   LOS: 5 days    Gayland Curry 06/01/2015

## 2015-06-01 NOTE — Progress Notes (Signed)
Pt discharged home with sister in stable condition. Discharge instructions and scripts given. Pt verbalized understanding. 

## 2015-06-02 LAB — CULTURE, BLOOD (ROUTINE X 2)
Culture: NO GROWTH
Culture: NO GROWTH

## 2015-06-24 DIAGNOSIS — Z9889 Other specified postprocedural states: Secondary | ICD-10-CM | POA: Diagnosis not present

## 2015-10-06 DIAGNOSIS — F209 Schizophrenia, unspecified: Secondary | ICD-10-CM | POA: Diagnosis not present

## 2015-11-09 ENCOUNTER — Ambulatory Visit: Payer: Commercial Managed Care - HMO | Admitting: Physician Assistant

## 2015-11-15 DIAGNOSIS — F209 Schizophrenia, unspecified: Secondary | ICD-10-CM | POA: Diagnosis not present

## 2015-12-28 DIAGNOSIS — F209 Schizophrenia, unspecified: Secondary | ICD-10-CM | POA: Diagnosis not present

## 2015-12-30 DIAGNOSIS — F209 Schizophrenia, unspecified: Secondary | ICD-10-CM | POA: Diagnosis not present

## 2016-02-01 DIAGNOSIS — F209 Schizophrenia, unspecified: Secondary | ICD-10-CM | POA: Diagnosis not present

## 2016-03-14 DIAGNOSIS — F209 Schizophrenia, unspecified: Secondary | ICD-10-CM | POA: Diagnosis not present

## 2016-04-18 DIAGNOSIS — F209 Schizophrenia, unspecified: Secondary | ICD-10-CM | POA: Diagnosis not present

## 2016-05-23 DIAGNOSIS — F209 Schizophrenia, unspecified: Secondary | ICD-10-CM | POA: Diagnosis not present

## 2016-06-14 ENCOUNTER — Other Ambulatory Visit: Payer: Self-pay | Admitting: Family Medicine

## 2016-06-14 DIAGNOSIS — Z87438 Personal history of other diseases of male genital organs: Secondary | ICD-10-CM

## 2016-06-14 DIAGNOSIS — E785 Hyperlipidemia, unspecified: Secondary | ICD-10-CM

## 2016-06-14 DIAGNOSIS — I10 Essential (primary) hypertension: Secondary | ICD-10-CM

## 2016-06-14 DIAGNOSIS — R1084 Generalized abdominal pain: Secondary | ICD-10-CM

## 2016-06-14 DIAGNOSIS — K219 Gastro-esophageal reflux disease without esophagitis: Secondary | ICD-10-CM

## 2016-06-28 DIAGNOSIS — F209 Schizophrenia, unspecified: Secondary | ICD-10-CM | POA: Diagnosis not present

## 2016-08-01 DIAGNOSIS — F209 Schizophrenia, unspecified: Secondary | ICD-10-CM | POA: Diagnosis not present

## 2016-09-05 DIAGNOSIS — F209 Schizophrenia, unspecified: Secondary | ICD-10-CM | POA: Diagnosis not present

## 2016-09-07 DIAGNOSIS — F209 Schizophrenia, unspecified: Secondary | ICD-10-CM | POA: Diagnosis not present

## 2016-09-13 ENCOUNTER — Other Ambulatory Visit: Payer: Self-pay

## 2016-09-13 DIAGNOSIS — I10 Essential (primary) hypertension: Secondary | ICD-10-CM

## 2016-09-13 DIAGNOSIS — R1084 Generalized abdominal pain: Secondary | ICD-10-CM

## 2016-09-13 DIAGNOSIS — Z87438 Personal history of other diseases of male genital organs: Secondary | ICD-10-CM

## 2016-09-13 DIAGNOSIS — K219 Gastro-esophageal reflux disease without esophagitis: Secondary | ICD-10-CM

## 2016-09-13 MED ORDER — LISINOPRIL 10 MG PO TABS: 10.0000 mg | ORAL_TABLET | Freq: Every day | ORAL | 0 refills | Status: DC

## 2016-10-08 DIAGNOSIS — F209 Schizophrenia, unspecified: Secondary | ICD-10-CM | POA: Diagnosis not present

## 2016-10-09 DIAGNOSIS — M6281 Muscle weakness (generalized): Secondary | ICD-10-CM | POA: Diagnosis not present

## 2016-10-09 DIAGNOSIS — M545 Low back pain: Secondary | ICD-10-CM | POA: Diagnosis not present

## 2016-10-09 DIAGNOSIS — M5136 Other intervertebral disc degeneration, lumbar region: Secondary | ICD-10-CM | POA: Diagnosis not present

## 2016-10-09 DIAGNOSIS — M62830 Muscle spasm of back: Secondary | ICD-10-CM | POA: Diagnosis not present

## 2016-10-10 DIAGNOSIS — F209 Schizophrenia, unspecified: Secondary | ICD-10-CM | POA: Diagnosis not present

## 2016-10-15 ENCOUNTER — Other Ambulatory Visit: Payer: Self-pay | Admitting: Physician Assistant

## 2016-10-15 DIAGNOSIS — R1084 Generalized abdominal pain: Secondary | ICD-10-CM

## 2016-10-15 DIAGNOSIS — I10 Essential (primary) hypertension: Secondary | ICD-10-CM

## 2016-10-15 DIAGNOSIS — Z87438 Personal history of other diseases of male genital organs: Secondary | ICD-10-CM

## 2016-10-15 DIAGNOSIS — K219 Gastro-esophageal reflux disease without esophagitis: Secondary | ICD-10-CM

## 2016-10-30 ENCOUNTER — Other Ambulatory Visit: Payer: Self-pay | Admitting: Physician Assistant

## 2016-10-30 DIAGNOSIS — K219 Gastro-esophageal reflux disease without esophagitis: Secondary | ICD-10-CM

## 2016-10-30 DIAGNOSIS — R1084 Generalized abdominal pain: Secondary | ICD-10-CM

## 2016-10-30 DIAGNOSIS — Z87438 Personal history of other diseases of male genital organs: Secondary | ICD-10-CM

## 2016-10-30 DIAGNOSIS — I10 Essential (primary) hypertension: Secondary | ICD-10-CM

## 2016-11-20 DIAGNOSIS — F209 Schizophrenia, unspecified: Secondary | ICD-10-CM | POA: Diagnosis not present

## 2016-12-31 DIAGNOSIS — F209 Schizophrenia, unspecified: Secondary | ICD-10-CM | POA: Diagnosis not present

## 2017-05-08 DIAGNOSIS — F209 Schizophrenia, unspecified: Secondary | ICD-10-CM | POA: Diagnosis not present

## 2017-08-19 DIAGNOSIS — F209 Schizophrenia, unspecified: Secondary | ICD-10-CM | POA: Diagnosis not present

## 2017-09-11 ENCOUNTER — Other Ambulatory Visit: Payer: Self-pay | Admitting: Physician Assistant

## 2017-09-11 DIAGNOSIS — R1084 Generalized abdominal pain: Secondary | ICD-10-CM

## 2017-09-11 DIAGNOSIS — K219 Gastro-esophageal reflux disease without esophagitis: Secondary | ICD-10-CM

## 2017-09-11 DIAGNOSIS — Z87438 Personal history of other diseases of male genital organs: Secondary | ICD-10-CM

## 2017-09-11 DIAGNOSIS — I10 Essential (primary) hypertension: Secondary | ICD-10-CM

## 2018-01-20 DIAGNOSIS — F209 Schizophrenia, unspecified: Secondary | ICD-10-CM | POA: Diagnosis not present

## 2018-04-24 DIAGNOSIS — F209 Schizophrenia, unspecified: Secondary | ICD-10-CM | POA: Diagnosis not present

## 2018-08-27 DIAGNOSIS — F209 Schizophrenia, unspecified: Secondary | ICD-10-CM | POA: Diagnosis not present

## 2018-09-11 ENCOUNTER — Other Ambulatory Visit: Payer: Self-pay

## 2018-09-11 ENCOUNTER — Ambulatory Visit (INDEPENDENT_AMBULATORY_CARE_PROVIDER_SITE_OTHER): Payer: Medicare HMO | Admitting: Family Medicine

## 2018-09-11 ENCOUNTER — Ambulatory Visit (INDEPENDENT_AMBULATORY_CARE_PROVIDER_SITE_OTHER): Payer: Medicare HMO

## 2018-09-11 ENCOUNTER — Encounter: Payer: Self-pay | Admitting: Family Medicine

## 2018-09-11 VITALS — BP 122/88 | HR 87 | Temp 98.4°F | Ht 71.0 in | Wt 175.0 lb

## 2018-09-11 DIAGNOSIS — M25562 Pain in left knee: Secondary | ICD-10-CM

## 2018-09-11 DIAGNOSIS — S8992XA Unspecified injury of left lower leg, initial encounter: Secondary | ICD-10-CM | POA: Diagnosis not present

## 2018-09-11 NOTE — Patient Instructions (Addendum)
X-ray looked okay.  Try the knee brace to see if that helps in the next week or 2, but if the pain is not improving at that time, or any worsening pain or instability symptoms please return sooner.  Tylenol over-the-counter is okay for now if needed.  Thank you for coming in today.   Knee Pain, Adult Knee pain in adults is common. It can be caused by many things, including:  Arthritis.  A fluid-filled sac (cyst) or growth in your knee.  An infection in your knee.  An injury that will not heal.  Damage, swelling, or irritation of the tissues that support your knee.  Knee pain is usually not a sign of a serious problem. The pain may go away on its own with time and rest. If it does not, a health care provider may order tests to find the cause of the pain. These may include:  Imaging tests, such as an X-ray, MRI, or ultrasound.  Joint aspiration. In this test, fluid is removed from the knee.  Arthroscopy. In this test, a lighted tube is inserted into knee and an image is projected onto a TV screen.  A biopsy. In this test, a sample of tissue is removed from the body and studied under a microscope.  Follow these instructions at home: Pay attention to any changes in your symptoms. Take these actions to relieve your pain. Activity  Rest your knee.  Do not do things that cause pain or make pain worse.  Avoid high-impact activities or exercises, such as running, jumping rope, or doing jumping jacks. General instructions  Take over-the-counter and prescription medicines only as told by your health care provider.  Raise (elevate) your knee above the level of your heart when you are sitting or lying down.  Sleep with a pillow under your knee.  If directed, apply ice to the knee: ? Put ice in a plastic bag. ? Place a towel between your skin and the bag. ? Leave the ice on for 20 minutes, 2-3 times a day.  Ask your health care provider if you should wear an elastic knee  support.  Lose weight if you are overweight. Extra weight can put pressure on your knee.  Do not use any products that contain nicotine or tobacco, such as cigarettes and e-cigarettes. Smoking may slow the healing of any bone and joint problems that you may have. If you need help quitting, ask your health care provider. Contact a health care provider if:  Your knee pain continues, changes, or gets worse.  You have a fever along with knee pain.  Your knee buckles or locks up.  Your knee swells, and the swelling becomes worse. Get help right away if:  Your knee feels warm to the touch.  You cannot move your knee.  You have severe pain in your knee.  You have chest pain.  You have trouble breathing. Summary  Knee pain in adults is common. It can be caused by many things, including, arthritis, infection, cysts, or injury.  Knee pain is usually not a sign of a serious problem, but if it does not go away, a health care provider may perform tests to know the cause of the pain.  Pay attention to any changes in your symptoms. Relieve your pain with rest, medicines, light activity, and use of ice.  Get help if your pain continues or becomes very severe, or if your knee buckles or locks up, or if you have chest pain or  trouble breathing. This information is not intended to replace advice given to you by your health care provider. Make sure you discuss any questions you have with your health care provider. Document Released: 08/12/2007 Document Revised: 10/05/2016 Document Reviewed: 10/05/2016 [employer] Interactive Patient Education  [employer]2018 [employer]     If you have lab work done today you will be contacted with your lab results within the next 2 weeks.  If you have not heard from us then please contact us. The fastest way to get your results is to register for My Chart.   IF you received an x-ray today, you will receive an invoice from [facility]. Please contact [facility] at [phone] with questions or concerns regarding your invoice.   IF you received labwork today, you will receive an invoice from [facility]. Please contact [facility] at [phone] with questions or concerns regarding your invoice.   Our billing staff will not be able to assist you with questions regarding bills from these companies.  You will be contacted with the lab results as soon as they are available. The fastest way to get your results is to activate your My Chart account. Instructions are located on the last page of this paperwork. If you have not heard from us regarding the results in 2 weeks, please contact this office.

## 2018-09-11 NOTE — Progress Notes (Signed)
Subjective:  By signing my name below, I, Travis Lawrence, attest that this documentation has been prepared under the direction and in the presence of Meredith Staggers, MD. Electronically Signed: Stann Lawrence, Scribe. 09/11/2018 , 11:55 AM .  Patient was seen in Room 9 .   Patient ID: Travis Lawrence, male    DOB: November 07, 1955, 62 y.o.   MRN: [id number] Chief Complaint  Patient presents with  . Knee Pain    left. going on 1 wk    HPI Travis Lawrence [facility]) is a 62 y.o. male  Patient states he was bending down to wash a grate on his heater about 1 week ago. After he washed it, he picked it up and felt a pop in his left knee when he stood up. He's been able to walk on it. Initially, there was more pain, but improved since then. He did notice some puffiness in his left knee. He wore an OTC elastic brace over it but it caused more swelling so he stopped wearing it. He has taken Advil for this pain. He denies history of surgeries or injections to his left knee.   Patient Active Problem List   Diagnosis Date Noted  . Elevated LFTs   . Pancreatitis, acute 05/28/2015  . Cholelithiasis 05/28/2015  . Abdominal pain 05/27/2015  . Essential hypertension, benign 01/23/2008  . HYPERTRIGLYCERIDEMIA 06/24/2007  . HYPERLIPIDEMIA 06/23/2007  . Paranoid schizophrenia (HCC) 06/23/2007  . GERD 06/23/2007  . HX, PERSONAL, TOBACCO USE 06/23/2007  . TONSILLECTOMY, HX OF 06/23/2007   Past Medical History:  Diagnosis Date  . GERD (gastroesophageal reflux disease)   . Hypertension   . Hypertriglyceridemia   . Schizophrenia [facility])    Past Surgical History:  Procedure Laterality Date  . CHOLECYSTECTOMY N/A 05/30/2015   Procedure: LAPAROSCOPIC SUBTOTAL CHOLECYSTECTOMY  ;  Surgeon: Gaynelle Adu, MD;  Location: [facility];  Service: General;  Laterality: N/A;  . TONSILLECTOMY     Allergies  Allergen Reactions  . Penicillins     REACTION: rash   Prior to Admission medications   Medication Sig Start  Date End Date Taking? Authorizing Provider  haloperidol decanoate (HALDOL DECANOATE) 100 MG/ML injection Inject 100 mg into the muscle every 28 (twenty-eight) days.   Yes [provider]   Social History   Socioeconomic History  . Marital status: Single    Spouse name: Not on file  . Number of children: Not on file  . Years of education: Not on file  . Highest education level: Not on file  Occupational History  . Not on file  Social Needs  . Financial resource strain: Not on file  . Food insecurity:    Worry: Not on file    Inability: Not on file  . Transportation needs:    Medical: Not on file    Non-medical: Not on file  Tobacco Use  . Smoking status: Current Every Day Smoker    Packs/day: 1.00    Types: Cigars  . Smokeless tobacco: Never Used  Substance and Sexual Activity  . Alcohol use: No    Alcohol/week: 0.0 standard drinks  . Drug use: No  . Sexual activity: Yes  Lifestyle  . Physical activity:    Days per week: Not on file    Minutes per session: Not on file  . Stress: Not on file  Relationships  . Social connections:    Talks on phone: Not on file    Gets together: Not on file  Attends religious service: Not on file    Active member of club or organization: Not on file    Attends meetings of clubs or organizations: Not on file    Relationship status: Not on file  . Intimate partner violence:    Fear of current or ex partner: Not on file    Emotionally abused: Not on file    Physically abused: Not on file    Forced sexual activity: Not on file  Other Topics Concern  . Not on file  Social History Narrative  . Not on file   Review of Systems  Constitutional: Negative for fatigue and unexpected weight change.  Eyes: Negative for visual disturbance.  Respiratory: Negative for cough, chest tightness and shortness of breath.   Cardiovascular: Negative for chest pain, palpitations and leg swelling.  Gastrointestinal: Negative for abdominal pain  and blood in stool.  Musculoskeletal: Positive for arthralgias (left knee) and joint swelling (left knee).  Neurological: Negative for dizziness, light-headedness and headaches.       Objective:   Physical Exam  Constitutional: He is oriented to person, place, and time. He appears well-developed and well-nourished. No distress.  HENT:  Head: Normocephalic and atraumatic.  Eyes: Pupils are equal, round, and reactive to light. EOM are normal.  Neck: Neck supple.  Cardiovascular: Normal rate.  Pulmonary/Chest: Effort normal. No respiratory distress.  Musculoskeletal: Normal range of motion.  Left knee: full ROM including full extension and flexion, trace effusion, extensor mechanism intact with normal straight leg raise without lag, slight tenderness over medial and lateral joint lines, slight tenderness of the patellar tendon and slight discomfort over distal femur; negative varus, negative valgus, negative lachman; slight guarded with mcmurray unable to complete exam  Neurological: He is alert and oriented to person, place, and time.  Skin: Skin is warm and dry.  Psychiatric: He has a normal mood and affect. His behavior is normal.  Nursing note and vitals reviewed.   Vitals:   09/11/18 1132  BP: 122/88  Pulse: 87  Temp: 98.4 F (36.9 C)  TempSrc: Oral  SpO2: 99%  Weight: 175 lb (79.4 kg)  Height: 5\' 11"  (1.803 m)   Dg Knee Complete 4 Views Left  Result Date: 09/11/2018 CLINICAL DATA:  Pain in the left knee after twisting injury 1 week ago EXAM: LEFT KNEE - COMPLETE 4+ VIEW COMPARISON:  None. FINDINGS: Joint spaces appear relatively normal for age with very little degenerative change. No fracture is seen and no joint effusion is noted. IMPRESSION: Negative. Electronically Signed   By: Dwyane Dee M.D.   On: 09/11/2018 12:19       Assessment & Plan:   Lawrence Travis is a 62 y.o. male Acute pain of left knee - Plan: DG Knee Complete 4 Views Left, Apply knee  sleeve  -Twisting injury, reassuring x-ray and overall exam is reassuring with full range of motion and strength, and without significant effusion at this time.  Symptoms have also improved.  Differential includes strain versus possible degenerative meniscal tear, but functionally he is doing very well.  -Web reaction brace applied for improved proprioception, did have some improvement in feeling after brace was placed.  symptomatic care with relative rest, range of motion, Tylenol over-the-counter, and RTC precautions if not continuing to improve in the next 2 weeks, sooner if worsening or instability symptoms.  No orders of the defined types were placed in this encounter.  Patient Instructions    X-ray looked okay.  Try the knee  brace to see if that helps in the next week or 2, but if the pain is not improving at that time, or any worsening pain or instability symptoms please return sooner.  Tylenol over-the-counter is okay for now if needed.  Thank you for coming in today.   Knee Pain, Adult Knee pain in adults is common. It can be caused by many things, including:  Arthritis.  A fluid-filled sac (cyst) or growth in your knee.  An infection in your knee.  An injury that will not heal.  Damage, swelling, or irritation of the tissues that support your knee.  Knee pain is usually not a sign of a serious problem. The pain may go away on its own with time and rest. If it does not, a health care provider may order tests to find the cause of the pain. These may include:  Imaging tests, such as an X-ray, MRI, or ultrasound.  Joint aspiration. In this test, fluid is removed from the knee.  Arthroscopy. In this test, a lighted tube is inserted into knee and an image is projected onto a TV screen.  A biopsy. In this test, a sample of tissue is removed from the body and studied under a microscope.  Follow these instructions at home: Pay attention to any changes in your symptoms. Take these  actions to relieve your pain. Activity  Rest your knee.  Do not do things that cause pain or make pain worse.  Avoid high-impact activities or exercises, such as running, jumping rope, or doing jumping jacks. General instructions  Take over-the-counter and prescription medicines only as told by your health care provider.  Raise (elevate) your knee above the level of your heart when you are sitting or lying down.  Sleep with a pillow under your knee.  If directed, apply ice to the knee: ? Put ice in a plastic bag. ? Place a towel between your skin and the bag. ? Leave the ice on for 20 minutes, 2-3 times a day.  Ask your health care provider if you should wear an elastic knee support.  Lose weight if you are overweight. Extra weight can put pressure on your knee.  Do not use any products that contain nicotine or tobacco, such as cigarettes and e-cigarettes. Smoking may slow the healing of any bone and joint problems that you may have. If you need help quitting, ask your health care provider. Contact a health care provider if:  Your knee pain continues, changes, or gets worse.  You have a fever along with knee pain.  Your knee buckles or locks up.  Your knee swells, and the swelling becomes worse. Get help right away if:  Your knee feels warm to the touch.  You cannot move your knee.  You have severe pain in your knee.  You have chest pain.  You have trouble breathing. Summary  Knee pain in adults is common. It can be caused by many things, including, arthritis, infection, cysts, or injury.  Knee pain is usually not a sign of a serious problem, but if it does not go away, a health care provider may perform tests to know the cause of the pain.  Pay attention to any changes in your symptoms. Relieve your pain with rest, medicines, light activity, and use of ice.  Get help if your pain continues or becomes very severe, or if your knee buckles or locks up, or if you  have chest pain or trouble breathing. This information is not  intended to replace advice given to you by your health care provider. Make sure you discuss any questions you have with your health care provider. Document Released: 08/12/2007 Document Revised: 10/05/2016 Document Reviewed: 10/05/2016 Elsevier Interactive Patient Education  [employer]2018 [employer] Inc.     If you have lab work done today you will be contacted with your lab results within the next 2 weeks.  If you have not heard from us then please contact us. The fastest way to get your results is to register for My Chart.   IF you received an x-ray today, you will receive an invoice from [facility]. Please contact [facility] at [phone] with questions or concerns regarding your invoice.   IF you received labwork today, you will receive an invoice from [facility]. Please contact [facility] at [phone] with questions or concerns regarding your invoice.   Our billing staff will not be able to assist you with questions regarding bills from these companies.  You will be contacted with the lab results as soon as they are available. The fastest way to get your results is to activate your My Chart account. Instructions are located on the last page of this paperwork. If you have not heard from us regarding the results in 2 weeks, please contact this office.       I personally performed the services described in this documentation, which was scribed in my presence. The recorded information has been reviewed and considered for accuracy and completeness, addended by me as needed, and agree with information above.  Signed,   Meredith StaggersJeffrey Vaishnav Demartin, MD Primary [facility] at [facility].  09/11/18 12:49 PM

## 2018-10-01 ENCOUNTER — Encounter: Payer: Medicare HMO | Admitting: Emergency Medicine

## 2019-10-29 ENCOUNTER — Ambulatory Visit: Payer: Medicare Other | Attending: Internal Medicine

## 2019-10-29 DIAGNOSIS — Z20822 Contact with and (suspected) exposure to covid-19: Secondary | ICD-10-CM

## 2019-10-31 LAB — NOVEL CORONAVIRUS, NAA: SARS-CoV-2, NAA: NOT DETECTED

## 2019-11-02 ENCOUNTER — Telehealth: Payer: Self-pay | Admitting: Hematology

## 2019-11-02 NOTE — Telephone Encounter (Signed)
Pt is aware covid 19 test is neg on 11/02/2019

## 2020-12-21 ENCOUNTER — Ambulatory Visit (INDEPENDENT_AMBULATORY_CARE_PROVIDER_SITE_OTHER): Payer: Medicare Other | Admitting: Registered Nurse

## 2020-12-21 ENCOUNTER — Other Ambulatory Visit: Payer: Self-pay

## 2020-12-21 ENCOUNTER — Encounter: Payer: Self-pay | Admitting: Registered Nurse

## 2020-12-21 VITALS — BP 146/95 | HR 74 | Temp 98.0°F | Resp 18 | Ht 72.0 in | Wt 166.0 lb

## 2020-12-21 DIAGNOSIS — Z7689 Persons encountering health services in other specified circumstances: Secondary | ICD-10-CM

## 2020-12-21 DIAGNOSIS — Z0000 Encounter for general adult medical examination without abnormal findings: Secondary | ICD-10-CM

## 2020-12-21 NOTE — Patient Instructions (Signed)
° ° ° °  If you have lab work done today you will be contacted with your lab results within the next 2 weeks.  If you have not heard from us then please contact us. The fastest way to get your results is to register for My Chart. ° ° °IF you received an x-ray today, you will receive an invoice from [facility]. Please contact [facility] at [phone] with questions or concerns regarding your invoice.  ° °IF you received labwork today, you will receive an invoice from [facility]. Please contact [facility] at [phone] with questions or concerns regarding your invoice.  ° °Our billing staff will not be able to assist you with questions regarding bills from these companies. ° °You will be contacted with the lab results as soon as they are available. The fastest way to get your results is to activate your My Chart account. Instructions are located on the last page of this paperwork. If you have not heard from us regarding the results in 2 weeks, please contact this office. °  ° ° ° °

## 2020-12-22 ENCOUNTER — Telehealth: Payer: Self-pay | Admitting: Registered Nurse

## 2020-12-22 LAB — CBC WITH DIFFERENTIAL/PLATELET
Basophils Absolute: 0.1 10*3/uL (ref 0.0–0.2)
Basos: 1 %
EOS (ABSOLUTE): 0.4 10*3/uL (ref 0.0–0.4)
Eos: 4 %
Hematocrit: 50.1 % (ref 37.5–51.0)
Hemoglobin: 17.4 g/dL (ref 13.0–17.7)
Immature Grans (Abs): 0 10*3/uL (ref 0.0–0.1)
Immature Granulocytes: 0 %
Lymphocytes Absolute: 3.8 10*3/uL — ABNORMAL HIGH (ref 0.7–3.1)
Lymphs: 45 %
MCH: 32.9 pg (ref 26.6–33.0)
MCHC: 34.7 g/dL (ref 31.5–35.7)
MCV: 95 fL (ref 79–97)
Monocytes Absolute: 0.5 10*3/uL (ref 0.1–0.9)
Monocytes: 6 %
Neutrophils Absolute: 3.8 10*3/uL (ref 1.4–7.0)
Neutrophils: 44 %
Platelets: 253 10*3/uL (ref 150–450)
RBC: 5.29 x10E6/uL (ref 4.14–5.80)
RDW: 12.6 % (ref 11.6–15.4)
WBC: 8.6 10*3/uL (ref 3.4–10.8)

## 2020-12-22 LAB — LIPID PANEL
Chol/HDL Ratio: 4 ratio (ref 0.0–5.0)
Cholesterol, Total: 194 mg/dL (ref 100–199)
HDL: 49 mg/dL (ref >39)
LDL Chol Calc (NIH): 123 mg/dL — ABNORMAL HIGH (ref 0–99)
Triglycerides: 124 mg/dL (ref 0–149)
VLDL Cholesterol Cal: 22 mg/dL (ref 5–40)

## 2020-12-22 LAB — COMPREHENSIVE METABOLIC PANEL
ALT: 7 IU/L (ref 0–44)
AST: 13 IU/L (ref 0–40)
Albumin/Globulin Ratio: 1.5 (ref 1.2–2.2)
Albumin: 4 g/dL (ref 3.8–4.8)
Alkaline Phosphatase: 124 IU/L — ABNORMAL HIGH (ref 44–121)
BUN/Creatinine Ratio: 14 (ref 10–24)
BUN: 15 mg/dL (ref 8–27)
Bilirubin Total: 0.4 mg/dL (ref 0.0–1.2)
CO2: 22 mmol/L (ref 20–29)
Calcium: 9.7 mg/dL (ref 8.6–10.2)
Chloride: 101 mmol/L (ref 96–106)
Creatinine, Ser: 1.06 mg/dL (ref 0.76–1.27)
GFR calc Af Amer: 85 mL/min/{1.73_m2} (ref >59)
GFR calc non Af Amer: 73 mL/min/{1.73_m2} (ref >59)
Globulin, Total: 2.6 g/dL (ref 1.5–4.5)
Glucose: 89 mg/dL (ref 65–99)
Potassium: 4 mmol/L (ref 3.5–5.2)
Sodium: 138 mmol/L (ref 134–144)
Total Protein: 6.6 g/dL (ref 6.0–8.5)

## 2020-12-22 LAB — TSH: TSH: 1.84 u[IU]/mL (ref 0.450–4.500)

## 2020-12-22 LAB — HEMOGLOBIN A1C
Est. average glucose Bld gHb Est-mCnc: 111 mg/dL
Hgb A1c MFr Bld: 5.5 % (ref 4.8–5.6)

## 2020-12-22 NOTE — Telephone Encounter (Signed)
Brother has concerns

## 2020-12-22 NOTE — Telephone Encounter (Addendum)
12/22/2020 - I RECEIVED A CALL FROM PATIENT'S BROTHER (DENNIS R. Satre). HE BROUGHT HIS BROTHER IN TO HAVE HIS PHYSICAL ON Wednesday 12/21/2020 WITH RICH MORROW. THERE ARE SOME THINGS HE NEEDS TO DISCUSS WITH RICH THAT HIS BROTHER WOULD NOT BE ABLE TO TELL HIM. DENNIS WILL COME BY THE OFFICE TODAY OR TOMORROW WITH HIS BROTHER TO GET A HIPAA FORM FILLED OUT AND SIGNED SO THAT HE AND HIS SISTER CAN GET INFORMATION ABOUT HIS MEDICAL CARE. HE HAS A MENTAL CAPACITY SO  HIM AND HIS SISTER NEEDS TO PARTICIPATE IN MEDICAL DECISION MAKING. PLEASE ROUTE MESSAGE TO THE CLINICAL TEAM FOR RICH WHEN THE HIPAA HAS BEEN SCANNED INTO THE [place] SYSTEM. DENNIS PHONE IF NEEDED IS: [phone]  MBC

## 2020-12-22 NOTE — Telephone Encounter (Signed)
Patient was seen 12/22/2020 and patient came in with Hilda Blades to check to make sure that a CT scan was ordered for lungs . Please call Maurine Minister per DPR at [phone] .   Wants status of referral / was unable to see on patients notes from most recent visit

## 2020-12-23 ENCOUNTER — Encounter: Payer: Self-pay | Admitting: Registered Nurse

## 2020-12-23 NOTE — Telephone Encounter (Signed)
Brother is calling in checking on status of this message. Please advise at [phone].

## 2020-12-25 NOTE — Telephone Encounter (Signed)
CT scan ordered, often takes a few weeks to happen.  Thanks,  Luan Pulling

## 2020-12-26 ENCOUNTER — Telehealth: Payer: Self-pay

## 2020-12-26 NOTE — Telephone Encounter (Signed)
Pt. Called asserting he had requested albuterol per their previous visit to his usual pharmacy 4 days ago.

## 2020-12-26 NOTE — Telephone Encounter (Signed)
Patient states on his visit last week he was suppose to have Albuterol sent to the pharmacy. PA

## 2020-12-27 ENCOUNTER — Other Ambulatory Visit: Payer: Self-pay | Admitting: Registered Nurse

## 2020-12-27 DIAGNOSIS — J432 Centrilobular emphysema: Secondary | ICD-10-CM

## 2020-12-27 MED ORDER — ALBUTEROL SULFATE HFA 108 (90 BASE) MCG/ACT IN AERS: 2.0000 | INHALATION_SPRAY | Freq: Four times a day (QID) | RESPIRATORY_TRACT | 0 refills | Status: DC | PRN

## 2020-12-27 NOTE — Telephone Encounter (Signed)
Called patient to let him know the medication was at the pharmacy

## 2020-12-27 NOTE — Telephone Encounter (Signed)
Sent ? ?Thanks, ? ?Rich

## 2021-01-15 ENCOUNTER — Other Ambulatory Visit: Payer: Self-pay | Admitting: Registered Nurse

## 2021-01-15 DIAGNOSIS — J432 Centrilobular emphysema: Secondary | ICD-10-CM

## 2021-01-15 NOTE — Telephone Encounter (Signed)
Requested medication (s) are due for refill today: no  Requested medication (s) are on the active medication list: yes  Last refill:  12/27/20  Future visit scheduled: no  Notes to clinic:  called and spoke with pt. Pt stated he is not out of inhaler, he just wanted one on file so he can have it when it is due. Pt stated that he is not out of medication and not having any breathing difficulty.   Requested Prescriptions  Pending Prescriptions Disp Refills   albuterol (VENTOLIN HFA) 108 (90 Base) MCG/ACT inhaler [Pharmacy Med Name: ALBUTEROL HFA (PROAIR) INHALER] 8.5 each     Sig: TAKE 2 PUFFS BY MOUTH EVERY 6 HOURS AS NEEDED FOR WHEEZE OR SHORTNESS OF BREATH      Pulmonology:  Beta Agonists Failed - 01/15/2021 12:46 AM      Failed - One inhaler should last at least one month. If the patient is requesting refills earlier, contact the patient to check for uncontrolled symptoms.      Passed - Valid encounter within last 12 months    Recent Outpatient Visits           3 weeks ago Routine general medical examination at a health care facility   [facility] at [facility], Gerlene Burdock, NP   2 years ago Acute pain of left knee   [facility] at [facility], Asencion Partridge, MD   5 years ago Essential hypertension, benign   Primary Care at [facility], Thao P, DO   6 years ago Essential hypertension, benign   Primary Care at [facility], Sandria Bales, MD   7 years ago Annual physical exam   Primary Care at [facility], Sandria Bales, MD

## 2021-01-24 ENCOUNTER — Telehealth: Payer: Self-pay | Admitting: Registered Nurse

## 2021-01-24 NOTE — Telephone Encounter (Signed)
CT scan lungs? Please advise last OV 01/18/2021

## 2021-01-24 NOTE — Telephone Encounter (Signed)
Pt called in stating that he was to have a CT Scan of the lungs done and no one has reached out to them.   I don't see an active order please advise   Last appt was 12/21/20 with Richard.

## 2021-02-06 ENCOUNTER — Other Ambulatory Visit: Payer: Self-pay | Admitting: Registered Nurse

## 2021-02-06 DIAGNOSIS — J432 Centrilobular emphysema: Secondary | ICD-10-CM

## 2021-02-14 ENCOUNTER — Other Ambulatory Visit: Payer: Self-pay

## 2021-02-14 ENCOUNTER — Ambulatory Visit (INDEPENDENT_AMBULATORY_CARE_PROVIDER_SITE_OTHER): Payer: Medicare Other | Admitting: Registered Nurse

## 2021-02-14 ENCOUNTER — Encounter: Payer: Self-pay | Admitting: Registered Nurse

## 2021-02-14 VITALS — BP 124/76 | HR 81 | Temp 98.4°F | Resp 16 | Ht 72.0 in | Wt 164.3 lb

## 2021-02-14 DIAGNOSIS — F17210 Nicotine dependence, cigarettes, uncomplicated: Secondary | ICD-10-CM | POA: Diagnosis not present

## 2021-02-14 MED ORDER — FLOVENT DISKUS 100 MCG/BLIST IN AEPB: 1.0000 | INHALATION_SPRAY | Freq: Two times a day (BID) | RESPIRATORY_TRACT | 0 refills | Status: DC

## 2021-02-14 NOTE — Progress Notes (Signed)
Established Patient Office Visit  Subjective:  Patient ID: Travis Lawrence, male    DOB: 1955/11/05  Age: 65 y.o. MRN: [id number]  CC:  Chief Complaint  Patient presents with  . Breathing Problem    Pt has had long standing breathing issues and is following up on some treatment plan that's been working on. Pt reports was supposed to have CT after last visit but this was not ordered     HPI Travis Lawrence presents for COPD follow up   Here with his brother who is advocating for him Would like to pursue low dose CT for lung ca screen.  States his breathing is steady. Albuterol helps when it gets worse. No overall improvement, though.  Otherwise no concerns.  His brother would like to discuss disease process, outlook, and recent labs  Past Medical History:  Diagnosis Date  . GERD (gastroesophageal reflux disease)   . Hypertension   . Hypertriglyceridemia   . Schizophrenia [facility])     Past Surgical History:  Procedure Laterality Date  . CHOLECYSTECTOMY N/A 05/30/2015   Procedure: LAPAROSCOPIC SUBTOTAL CHOLECYSTECTOMY  ;  Surgeon: Gaynelle Adu, MD;  Location: [facility];  Service: General;  Laterality: N/A;  . TONSILLECTOMY      Family History  Problem Relation Age of Onset  . Esophageal cancer Mother   . Lung cancer Father   . Heart attack Father   . Heart attack Brother   . Lung cancer Sister     Social History   Socioeconomic History  . Marital status: Single    Spouse name: Not on file  . Number of children: Not on file  . Years of education: Not on file  . Highest education level: Not on file  Occupational History  . Not on file  Tobacco Use  . Smoking status: Current Every Day Smoker    Packs/day: 1.00    Years: 41.00    Pack years: 41.00    Types: Cigars  . Smokeless tobacco: Never Used  Vaping Use  . Vaping Use: Never used  Substance and Sexual Activity  . Alcohol use: No    Alcohol/week: 0.0 standard drinks  . Drug use: No  . Sexual activity: Yes   Other Topics Concern  . Not on file  Social History Narrative  . Not on file   Social Determinants of Health   Financial Resource Strain: Not on file  Food Insecurity: Not on file  Transportation Needs: Not on file  Physical Activity: Not on file  Stress: Not on file  Social Connections: Not on file  Intimate Partner Violence: Not on file    Outpatient Medications Prior to Visit  Medication Sig Dispense Refill  . albuterol (VENTOLIN HFA) 108 (90 Base) MCG/ACT inhaler TAKE 2 PUFFS BY MOUTH EVERY 6 HOURS AS NEEDED FOR WHEEZE OR SHORTNESS OF BREATH 8.5 each 5  . aspirin EC 81 MG tablet Take 81 mg by mouth daily. Swallow whole.    . haloperidol decanoate (HALDOL DECANOATE) 100 MG/ML injection Inject 100 mg into the muscle every 28 (twenty-eight) days.     No facility-administered medications prior to visit.    Allergies  Allergen Reactions  . Penicillins     REACTION: rash    ROS Review of Systems  Constitutional: Negative.   HENT: Negative.   Eyes: Negative.   Respiratory: Positive for shortness of breath and wheezing.   Cardiovascular: Negative.   Gastrointestinal: Negative.   Genitourinary: Negative.   Musculoskeletal: Negative.  Skin: Negative.   Neurological: Negative.   Psychiatric/Behavioral: Negative.   All other systems reviewed and are negative.     Objective:    Physical Exam Constitutional:      General: He is not in acute distress.    Appearance: Normal appearance. He is normal weight. He is not ill-appearing, toxic-appearing or diaphoretic.  Cardiovascular:     Rate and Rhythm: Normal rate and regular rhythm.     Heart sounds: Normal heart sounds. No murmur heard. No friction rub. No gallop.   Pulmonary:     Effort: Pulmonary effort is normal. No respiratory distress.     Breath sounds: Normal breath sounds. No stridor. No wheezing, rhonchi or rales.  Chest:     Chest wall: No tenderness.  Neurological:     General: No focal deficit  present.     Mental Status: He is alert and oriented to person, place, and time. Mental status is at baseline.  Psychiatric:        Mood and Affect: Mood normal.        Behavior: Behavior normal.        Thought Content: Thought content normal.        Judgment: Judgment normal.     BP 124/76   Pulse 81   Temp 98.4 F (36.9 C) (Temporal)   Resp 16   Ht 6' (1.829 m)   Wt 164 lb 4.8 oz (74.5 kg)   SpO2 90%   BMI 22.28 kg/m  Wt Readings from Last 3 Encounters:  02/14/21 164 lb 4.8 oz (74.5 kg)  12/21/20 166 lb (75.3 kg)  09/11/18 175 lb (79.4 kg)     There are no preventive care reminders to display for this patient.  There are no preventive care reminders to display for this patient.  Lab Results  Component Value Date   TSH 1.840 12/21/2020   Lab Results  Component Value Date   WBC 8.6 12/21/2020   HGB 17.4 12/21/2020   HCT 50.1 12/21/2020   MCV 95 12/21/2020   PLT 253 12/21/2020   Lab Results  Component Value Date   NA 138 12/21/2020   K 4.0 12/21/2020   CO2 22 12/21/2020   GLUCOSE 89 12/21/2020   BUN 15 12/21/2020   CREATININE 1.06 12/21/2020   BILITOT 0.4 12/21/2020   ALKPHOS 124 (H) 12/21/2020   AST 13 12/21/2020   ALT 7 12/21/2020   PROT 6.6 12/21/2020   ALBUMIN 4.0 12/21/2020   CALCIUM 9.7 12/21/2020   ANIONGAP 8 06/01/2015   Lab Results  Component Value Date   CHOL 194 12/21/2020   Lab Results  Component Value Date   HDL 49 12/21/2020   Lab Results  Component Value Date   LDLCALC 123 (H) 12/21/2020   Lab Results  Component Value Date   TRIG 124 12/21/2020   Lab Results  Component Value Date   CHOLHDL 4.0 12/21/2020   Lab Results  Component Value Date   HGBA1C 5.5 12/21/2020      Assessment & Plan:   Problem List Items Addressed This Visit   None   Visit Diagnoses    Smokes with greater than 40 pack year history    -  Primary   Relevant Medications   Fluticasone Propionate, Inhal, (FLOVENT DISKUS) 100 MCG/BLIST AEPB    Other Relevant Orders   CT CHEST LUNG CA SCREEN LOW DOSE W/O CM      Meds ordered this encounter  Medications  . Fluticasone Propionate, Inhal, (  FLOVENT DISKUS) 100 MCG/BLIST AEPB    Sig: Inhale 1 puff into the lungs in the morning and at bedtime.    Dispense:  180 each    Refill:  0    Order Specific Question:   Supervising Provider    Answer:   Neva Seat, JEFFREY R [[id number]]    Follow-up: No follow-ups on file.   PLAN  Add flovent daily, continue albuterol prn use  Order ct chest low dose lung ca screen  Follow up based on results  Will likely refer pt to pulmonology given his extensive emphysema  Patient encouraged to call clinic with any questions, comments, or concerns.  I spent 46 minutes with this patient, more than 50% of which was spent counseling and/or educating.  Janeece Agee, NP

## 2021-02-15 ENCOUNTER — Telehealth: Payer: Self-pay

## 2021-02-15 NOTE — Telephone Encounter (Signed)
Pt pharmacy faxed [place] requesting alternative to Flovent Rx please advise

## 2021-02-16 ENCOUNTER — Other Ambulatory Visit: Payer: Self-pay | Admitting: Registered Nurse

## 2021-02-16 DIAGNOSIS — J432 Centrilobular emphysema: Secondary | ICD-10-CM

## 2021-02-16 MED ORDER — ARNUITY ELLIPTA 200 MCG/ACT IN AEPB: 1.0000 | INHALATION_SPRAY | Freq: Every day | RESPIRATORY_TRACT | 3 refills | Status: DC

## 2021-02-27 ENCOUNTER — Other Ambulatory Visit: Payer: Self-pay

## 2021-02-27 ENCOUNTER — Ambulatory Visit (HOSPITAL_BASED_OUTPATIENT_CLINIC_OR_DEPARTMENT_OTHER)
Admission: RE | Admit: 2021-02-27 | Discharge: 2021-02-27 | Disposition: A | Discharge status: Home / Self Care | Payer: Medicare Other | Source: Ambulatory Visit | Attending: Registered Nurse | Admitting: Registered Nurse

## 2021-02-27 DIAGNOSIS — F17210 Nicotine dependence, cigarettes, uncomplicated: Secondary | ICD-10-CM | POA: Insufficient documentation

## 2021-03-06 ENCOUNTER — Other Ambulatory Visit: Payer: Self-pay | Admitting: Registered Nurse

## 2021-03-06 DIAGNOSIS — I700 Atherosclerosis of aorta: Secondary | ICD-10-CM

## 2021-03-06 DIAGNOSIS — J439 Emphysema, unspecified: Secondary | ICD-10-CM | POA: Insufficient documentation

## 2021-03-06 DIAGNOSIS — R918 Other nonspecific abnormal finding of lung field: Secondary | ICD-10-CM

## 2021-03-07 ENCOUNTER — Encounter: Payer: Self-pay | Admitting: Registered Nurse

## 2021-03-09 ENCOUNTER — Other Ambulatory Visit: Payer: Self-pay | Admitting: Registered Nurse

## 2021-03-09 ENCOUNTER — Telehealth: Payer: Self-pay | Admitting: Registered Nurse

## 2021-03-09 DIAGNOSIS — R918 Other nonspecific abnormal finding of lung field: Secondary | ICD-10-CM

## 2021-03-09 NOTE — Telephone Encounter (Signed)
Patient had CT Scan last week.  He would like for someone to give him a call with the results.  Please advise.

## 2021-03-09 NOTE — Telephone Encounter (Signed)
Sent message to [employer] and [place] with details. Routine follow up in 6 mo. No acutely concerning abnormalities, just some benign appearing nodules.  Symptoms likely resulting from his emphysema. Continue to use inhalers and consider smoking cessation  Thank you  Luan Pulling

## 2021-03-09 NOTE — Telephone Encounter (Signed)
Can call patient to say that results show benign nodules. Worth repeating in 6 months to make sure they don't change. No cause for alarm at this time.   Symptoms are likely being caused by the emphysema he has from smoking.  Thank you  Rich

## 2021-03-09 NOTE — Telephone Encounter (Signed)
Dicussed with pt and he understands results

## 2021-03-09 NOTE — Telephone Encounter (Signed)
Called patient with results per pcp. Patient voiced understanding.

## 2021-04-18 ENCOUNTER — Telehealth: Payer: Self-pay | Admitting: Registered Nurse

## 2021-04-18 NOTE — Telephone Encounter (Signed)
Error

## 2021-04-18 NOTE — Telephone Encounter (Signed)
Left message for patient to schedule Annual Wellness Visit.  Please schedule with Nurse Health Advisor Julie Greer, RN at [facility]   

## 2021-06-12 IMAGING — CT CT CHEST LUNG CANCER SCREENING LOW DOSE W/O CM
1 series · 10 of 10 positions shown, 13 images · non-contrast
Comparison: No priors.

CLINICAL DATA: 64-year-old male current smoker with 40 pack-year
history of smoking. Lung cancer screening examination.

EXAM:
CT CHEST WITHOUT CONTRAST LOW-DOSE FOR LUNG CANCER SCREENING
TECHNIQUE: Multidetector CT imaging of the chest was performed following the
standard protocol without IV contrast.

[ct lung segmentation data · axial · 0.83mm/px · z∈[+1156,+1156]mm · 10 of 356 frames shown]
[frame 1/356  mediastinal]
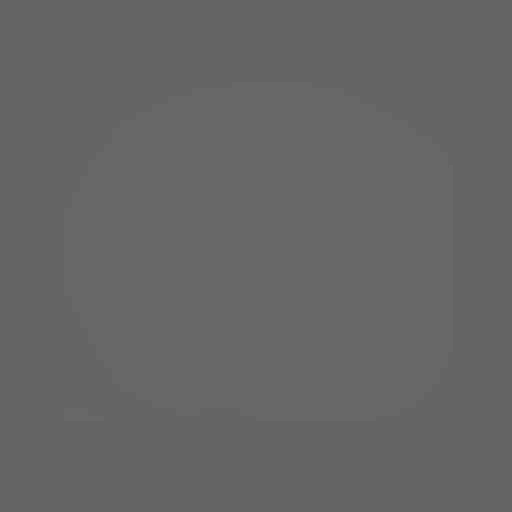
[frame 1/356  lung]
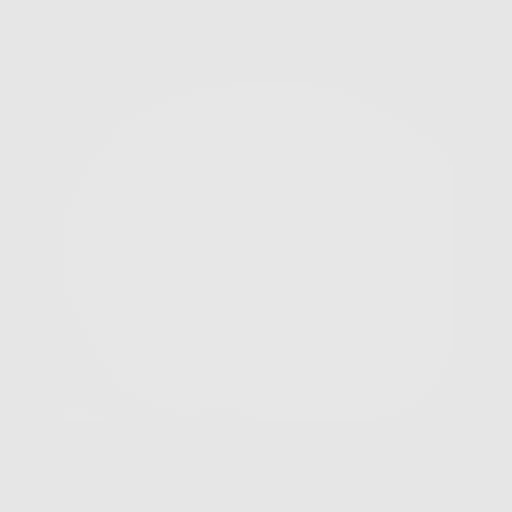
[frame 40/356  lung]
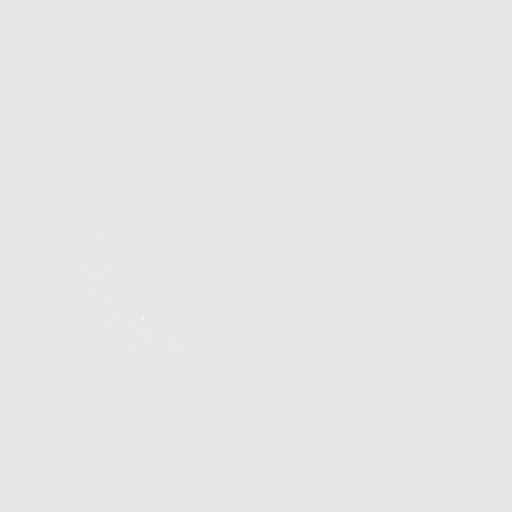
[frame 79/356  lung]
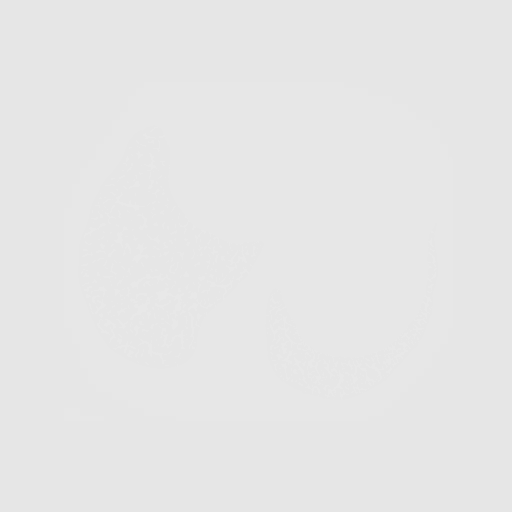
[frame 119/356  lung]
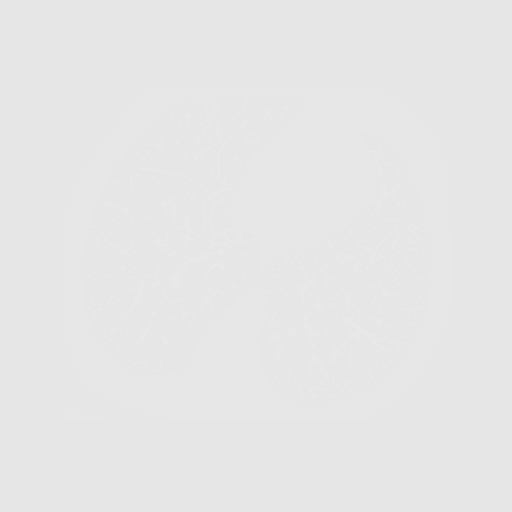
[frame 158/356  mediastinal]
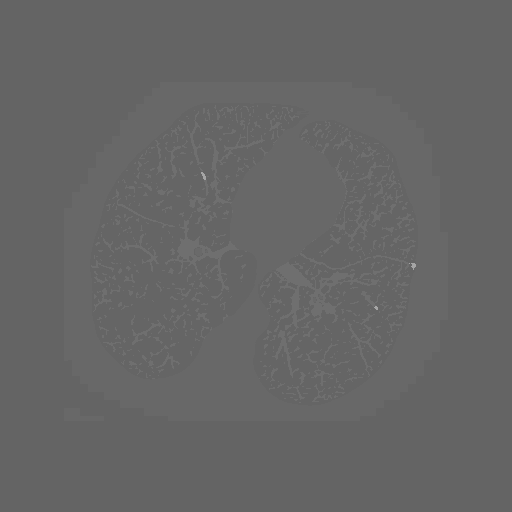
[frame 158/356  lung]
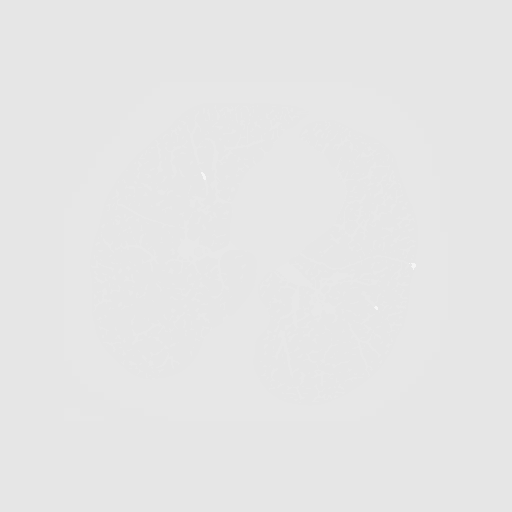
[frame 198/356  lung]
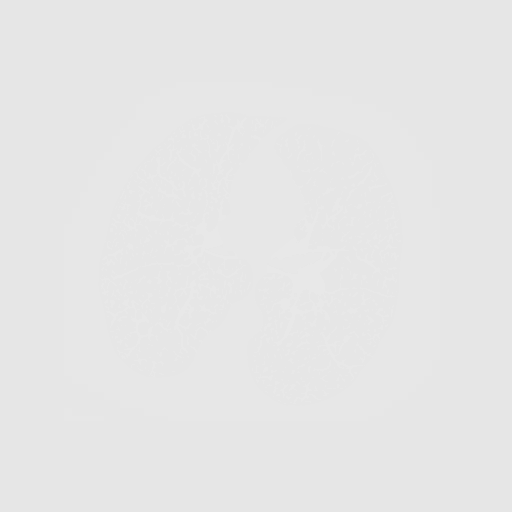
[frame 237/356  lung]
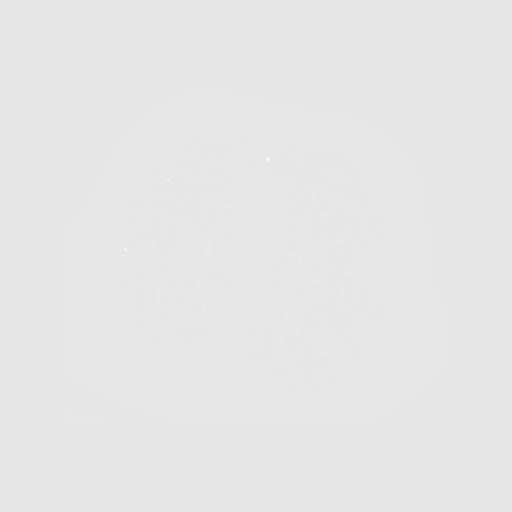
[frame 277/356  lung]
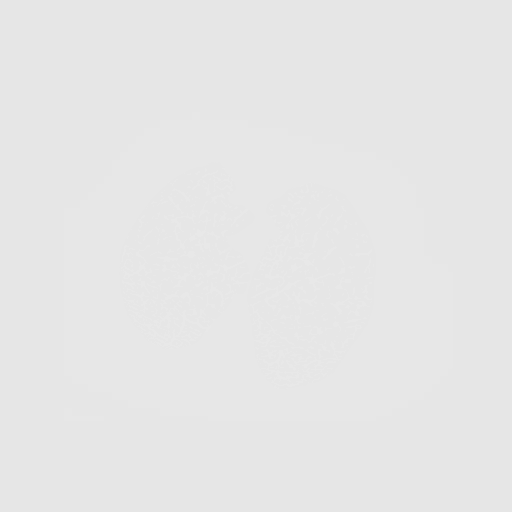
[frame 316/356  mediastinal]
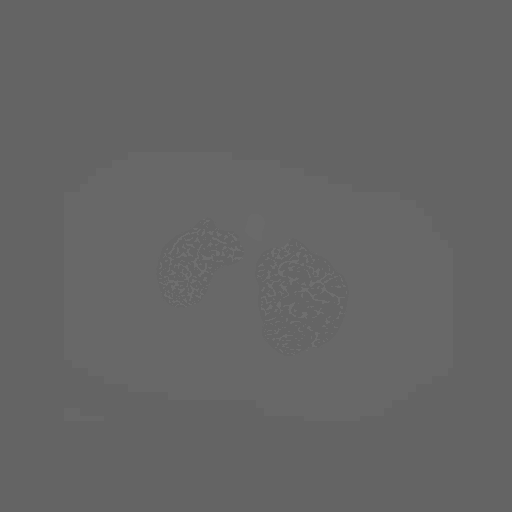
[frame 316/356  lung]
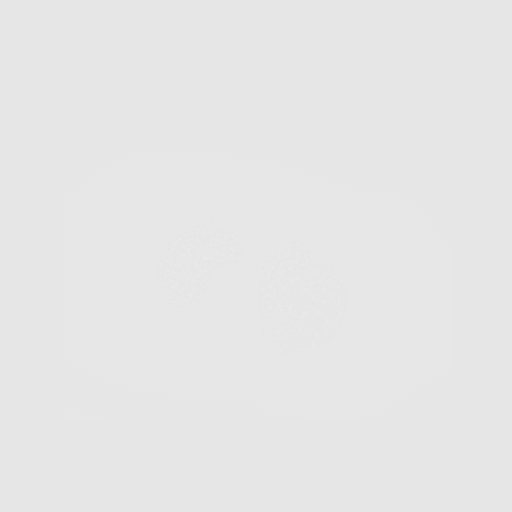
[frame 356/356  lung]
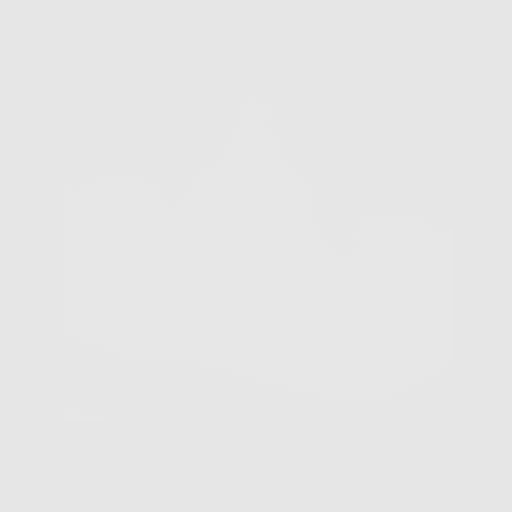

[10 of 10 positions shown; findings below may reference images not displayed]

FINDINGS: Cardiovascular: Heart size is normal. There is no significant
pericardial fluid, thickening or pericardial calcification. There is
aortic atherosclerosis, as well as atherosclerosis of the great
vessels of the mediastinum and the coronary arteries, including
calcified atherosclerotic plaque in the left anterior descending,
left circumflex and right coronary arteries.

Mediastinum/Nodes: No pathologically enlarged mediastinal or hilar
lymph nodes. Please note that accurate exclusion of hilar adenopathy
is limited on noncontrast CT scans. Esophagus is unremarkable in
appearance. No axillary lymphadenopathy.

Lungs/Pleura: Several small pulmonary nodules are noted in the lungs
bilaterally, largest of which is in the medial aspect of the left
upper lobe (axial image 221 of series 3), with a volume derived mean
diameter of 7.6 mm. No other larger more suspicious appearing
pulmonary nodules or masses are noted. No acute consolidative
airspace disease. No pleural effusions. Diffuse bronchial wall
thickening with moderate centrilobular and paraseptal emphysema.

Upper Abdomen: Status post cholecystectomy. Liver has a shrunken
appearance and nodular contour, suggesting underlying cirrhosis.

Musculoskeletal: There are no aggressive appearing lytic or blastic
lesions noted in the visualized portions of the skeleton.
IMPRESSION: 1. Lung-RADS 3S, probably benign findings. Short-term follow-up in 6
months is recommended with repeat low-dose chest CT without contrast
(please use the following order, "CT CHEST LCS NODULE FOLLOW-UP W/O
CM").
2. The "S" modifier above refers to potentially clinically
significant non lung cancer related findings. Specifically, there is
aortic atherosclerosis, in addition to 3 vessel coronary artery
disease. Please note that although the presence of coronary artery
calcium documents the presence of coronary artery disease, the
severity of this disease and any potential stenosis cannot be
assessed on this non-gated CT examination. Assessment for potential
risk factor modification, dietary therapy or pharmacologic therapy
may be warranted, if clinically indicated.
3. Mild diffuse bronchial wall thickening with moderate
centrilobular and paraseptal emphysema; imaging findings suggestive
of underlying COPD.
4. The appearance of the liver suggests underlying cirrhosis.

Aortic Atherosclerosis (4OGLG-XE5.5) and Emphysema (4OGLG-Z3K.D).

## 2021-07-24 ENCOUNTER — Ambulatory Visit (INDEPENDENT_AMBULATORY_CARE_PROVIDER_SITE_OTHER): Payer: Medicare Other | Admitting: *Deleted

## 2021-07-24 ENCOUNTER — Ambulatory Visit: Payer: Medicare Other

## 2021-07-24 DIAGNOSIS — Z0000 Encounter for general adult medical examination without abnormal findings: Secondary | ICD-10-CM | POA: Diagnosis not present

## 2021-07-24 NOTE — Progress Notes (Signed)
Subjective:   Travis Lawrence is a 65 y.o. male who presents for Medicare Annual/Subsequent preventive examination.  I connected with  Travis Lawrence on 07/24/21 by a telephone enabled telemedicine application and verified that I am speaking with the correct person using two identifiers.   I discussed the limitations of evaluation and management by telemedicine. The patient expressed understanding and agreed to proceed.    Review of Systems     Cardiac Risk Factors include: advanced age (>85men, >10 women);hypertension;male gender;smoking/ tobacco exposure     Objective:    Today's Vitals   There is no height or weight on file to calculate BMI.  Advanced Directives 07/24/2021 05/27/2015  Does Patient Have a Medical Advance Directive? No No  Would patient like information on creating a medical advance directive? No - Patient declined No - patient declined information    Current Medications (verified) Outpatient Encounter Medications as of 07/24/2021  Medication Sig   albuterol (VENTOLIN HFA) 108 (90 Base) MCG/ACT inhaler TAKE 2 PUFFS BY MOUTH EVERY 6 HOURS AS NEEDED FOR WHEEZE OR SHORTNESS OF BREATH   aspirin EC 81 MG tablet Take 81 mg by mouth daily. Swallow whole.   Fluticasone Furoate (ARNUITY ELLIPTA) 200 MCG/ACT AEPB Inhale 1 puff into the lungs daily.   haloperidol decanoate (HALDOL DECANOATE) 100 MG/ML injection Inject 100 mg into the muscle every 28 (twenty-eight) days.   Fluticasone Propionate, Inhal, (FLOVENT DISKUS) 100 MCG/BLIST AEPB Inhale 1 puff into the lungs in the morning and at bedtime. (Patient not taking: Reported on 07/24/2021)   No facility-administered encounter medications on file as of 07/24/2021.    Allergies (verified) Penicillins   History: Past Medical History:  Diagnosis Date   GERD (gastroesophageal reflux disease)    Hypertension    Hypertriglyceridemia    Schizophrenia (HCC)    Past Surgical History:  Procedure Laterality Date    CHOLECYSTECTOMY N/A 05/30/2015   Procedure: LAPAROSCOPIC SUBTOTAL CHOLECYSTECTOMY  ;  Surgeon: Gaynelle Adu, MD;  Location: [facility];  Service: General;  Laterality: N/A;   TONSILLECTOMY     Family History  Problem Relation Age of Onset   Esophageal cancer Mother    Lung cancer Father    Heart attack Father    Heart attack Brother    Lung cancer Sister    Social History   Socioeconomic History   Marital status: Single    Spouse name: Not on file   Number of children: Not on file   Years of education: Not on file   Highest education level: Not on file  Occupational History   Not on file  Tobacco Use   Smoking status: Every Day    Packs/day: 1.00    Years: 41.00    Pack years: 41.00    Types: Cigars, Cigarettes   Smokeless tobacco: Never  Vaping Use   Vaping Use: Never used  Substance and Sexual Activity   Alcohol use: No    Alcohol/week: 0.0 standard drinks   Drug use: No   Sexual activity: Yes  Other Topics Concern   Not on file  Social History Narrative   Not on file   Social Determinants of Health   Financial Resource Strain: Low Risk    Difficulty of Paying Living Expenses: Not hard at all  Food Insecurity: No Food Insecurity   Worried About Programme researcher, broadcasting/film/video in the Last Year: Never true   Ran Out of Food in the Last Year: Never true  Transportation Needs: No  Transportation Needs   Lack of Transportation (Medical): No   Lack of Transportation (Non-Medical): No  Physical Activity: Sufficiently Active   Days of Exercise per Week: 4 days   Minutes of Exercise per Session: 40 min  Stress: No Stress Concern Present   Feeling of Stress : Not at all  Social Connections: Moderately Isolated   Frequency of Communication with Friends and Family: More than three times a week   Frequency of Social Gatherings with Friends and Family: More than three times a week   Attends Religious Services: More than 4 times per year   Active Member of [employer] or Organizations: No    Attends Engineer, structural: Never   Marital Status: Never married    Tobacco Counseling Ready to quit: Not Answered Counseling given: Not Answered   Clinical Intake:  Pre-visit preparation completed: Yes  Pain : No/denies pain     Nutritional Risks: None Diabetes: No  How often do you need to have someone help you when you read instructions, pamphlets, or other written materials from your doctor or pharmacy?: 1 - Never  Diabetic? no  Interpreter Needed?: No  Information entered by :: Remi Haggard LPN   Activities of Daily Living In your present state of health, do you have any difficulty performing the following activities: 07/24/2021  Hearing? N  Vision? N  Difficulty concentrating or making decisions? N  Walking or climbing stairs? N  Dressing or bathing? N  Doing errands, shopping? N  Preparing Food and eating ? N  Using the Toilet? N  In the past six months, have you accidently leaked urine? N  Do you have problems with loss of bowel control? N  Managing your Medications? N  Managing your Finances? N  Housekeeping or managing your Housekeeping? N  Some recent data might be hidden    Patient Care Team: Janeece Agee, NP as PCP - General (Adult Health Nurse Practitioner)  Indicate any recent Medical Services you may have received from other than [facility] providers in the past year (date may be approximate).     Assessment:   This is a routine wellness examination for Travis Lawrence.  Hearing/Vision screen Hearing Screening - Comments:: No hearing issues Vision Screening - Comments:: Not up to date [facility]  Dietary issues and exercise activities discussed: Current Exercise Habits: Home exercise routine;Structured exercise class, Type of exercise: walking, Time (Minutes): 35, Frequency (Times/Week): 4, Weekly Exercise (Minutes/Week): 140, Intensity: Mild   Goals Addressed             This Visit's Progress    Quit Smoking          Depression Screen PHQ 2/9 Scores 07/24/2021 12/21/2020 09/11/2018 05/26/2015  PHQ - 2 Score 0 0 0 0    Fall Risk Fall Risk  07/24/2021 12/21/2020 09/11/2018  Falls in the past year? 0 0 0  Number falls in past yr: 0 0 -  Injury with Fall? 0 0 -  Follow up Falls evaluation completed;Falls prevention discussed Falls evaluation completed -    FALL RISK PREVENTION PERTAINING TO THE HOME:  Any stairs in or around the home? No  If so, are there any without handrails? No  Home free of loose throw rugs in walkways, pet beds, electrical cords, etc? Yes  Adequate lighting in your home to reduce risk of falls? Yes   ASSISTIVE DEVICES UTILIZED TO PREVENT FALLS:  Life alert? No  Use of a cane, walker or w/c? No  Grab bars  in the bathroom? No  Shower chair or bench in shower? No  Elevated toilet seat or a handicapped toilet? No   TIMED UP AND GO:  Was the test performed? No .    Cognitive Function:        Immunizations Immunization History  Administered Date(s) Administered   Influenza Whole 08/29/2006   Influenza-Unspecified 07/29/2018   PFIZER(Purple Top)SARS-COV-2 Vaccination 01/21/2020, 02/11/2020, 09/12/2020, 01/28/2021   Pneumococcal Polysaccharide-23 11/20/2015   Td 06/05/2006    TDAP status: Due, Education has been provided regarding the importance of this vaccine. Advised may receive this vaccine at local pharmacy or Health Dept. Aware to provide a copy of the vaccination record if obtained from local pharmacy or Health Dept. Verbalized acceptance and understanding.  Flu Vaccine status: Due, Education has been provided regarding the importance of this vaccine. Advised may receive this vaccine at local pharmacy or Health Dept. Aware to provide a copy of the vaccination record if obtained from local pharmacy or Health Dept. Verbalized acceptance and understanding.  Pneumococcal vaccine status: Due, Education has been provided regarding the importance of this vaccine.  Advised may receive this vaccine at local pharmacy or Health Dept. Aware to provide a copy of the vaccination record if obtained from local pharmacy or Health Dept. Verbalized acceptance and understanding.  Covid-19 vaccine status: Completed vaccines  Qualifies for Shingles Vaccine? No   Zostavax completed No   Shingrix Completed?: No.    Education has been provided regarding the importance of this vaccine. Patient has been advised to call insurance company to determine out of pocket expense if they have not yet received this vaccine. Advised may also receive vaccine at local pharmacy or Health Dept. Verbalized acceptance and understanding.  Screening Tests Health Maintenance  Topic Date Due   INFLUENZA VACCINE  05/29/2021   Zoster Vaccines- Shingrix (1 of 2) 10/23/2021 (Originally 10/03/2005)   COLONOSCOPY (Pts 45-54yrs Insurance coverage will need to be confirmed)  12/21/2021 (Originally 10/03/2000)   TETANUS/TDAP  12/21/2021 (Originally 06/05/2016)   COVID-19 Vaccine  Completed   Hepatitis C Screening  Completed   HIV Screening  Completed   HPV VACCINES  Aged Out    Health Maintenance  Health Maintenance Due  Topic Date Due   INFLUENZA VACCINE  05/29/2021    Colorectal cancer screening: Type of screening: Cologuard. Completed  . Repeat every 3 years  Lung Cancer Screening: (Low Dose CT Chest recommended if Age 63-80 years, 30 pack-year currently smoking OR have quit w/in 15years.) does not qualify.   Lung Cancer Screening Referral:   Additional Screening:  Hepatitis C Screening: does not qualify; Completed 2016  Vision Screening: Recommended annual ophthalmology exams for early detection of glaucoma and other disorders of the eye. Is the patient up to date with their annual eye exam?  Yes  Who is the provider or what is the name of the office in which the patient attends annual eye exams? [facility] If pt is not established with a provider, would they like to be referred to  a provider to establish care? No .   Dental Screening: Recommended annual dental exams for proper oral hygiene  Community Resource Referral / Chronic Care Management: CRR required this visit?  No   CCM required this visit?  No      Plan:     I have personally reviewed and noted the following in the patient's chart:   Medical and social history Use of alcohol, tobacco or illicit drugs  Current medications  and supplements including opioid prescriptions. Patient is not currently taking opioid prescriptions. Functional ability and status Nutritional status Physical activity Advanced directives List of other physicians Hospitalizations, surgeries, and ER visits in previous 12 months Vitals Screenings to include cognitive, depression, and falls Referrals and appointments  In addition, I have reviewed and discussed with patient certain preventive protocols, quality metrics, and best practice recommendations. A written personalized care plan for preventive services as well as general preventive health recommendations were provided to patient.     Remi Haggard, LPN   4/74/2595   Nurse Notes:

## 2021-07-24 NOTE — Patient Instructions (Signed)
Travis Lawrence , Thank you for taking time to come for your Medicare Wellness Visit. I appreciate your ongoing commitment to your health goals. Please review the following plan we discussed and let me know if I can assist you in the future.   Screening recommendations/referrals: Colonoscopy: up to date  (cologard) Recommended yearly ophthalmology/optometry visit for glaucoma screening and checkup Recommended yearly dental visit for hygiene and checkup  Vaccinations: Influenza vaccine: education provided Pneumococcal vaccine: Education provided Tdap vaccine: Education provided Shingles vaccine: Education provided.  Provided date to document    Advanced directives: Education provided  Conditions/risks identified:    Preventive Care 72 Years and Older, Male Preventive care refers to lifestyle choices and visits with your health care provider that can promote health and wellness. What does preventive care include? A yearly physical exam. This is also called an annual well check. Dental exams once or twice a year. Routine eye exams. Ask your health care provider how often you should have your eyes checked. Personal lifestyle choices, including: Daily care of your teeth and gums. Regular physical activity. Eating a healthy diet. Avoiding tobacco and drug use. Limiting alcohol use. Practicing safe sex. Taking low doses of aspirin every day. Taking vitamin and mineral supplements as recommended by your health care provider. What happens during an annual well check? The services and screenings done by your health care provider during your annual well check will depend on your age, overall health, lifestyle risk factors, and family history of disease. Counseling  Your health care provider may ask you questions about your: Alcohol use. Tobacco use. Drug use. Emotional well-being. Home and relationship well-being. Sexual activity. Eating habits. History of falls. Memory and ability to  understand (cognition). Work and work Astronomer. Screening  You may have the following tests or measurements: Height, weight, and BMI. Blood pressure. Lipid and cholesterol levels. These may be checked every 5 years, or more frequently if you are over 66 years old. Skin check. Lung cancer screening. You may have this screening every year starting at age 66 if you have a 30-pack-year history of smoking and currently smoke or have quit within the past 15 years. Fecal occult blood test (FOBT) of the stool. You may have this test every year starting at age 24. Flexible sigmoidoscopy or colonoscopy. You may have a sigmoidoscopy every 5 years or a colonoscopy every 10 years starting at age 47. Prostate cancer screening. Recommendations will vary depending on your family history and other risks. Hepatitis C blood test. Hepatitis B blood test. Sexually transmitted disease (STD) testing. Diabetes screening. This is done by checking your blood sugar (glucose) after you have not eaten for a while (fasting). You may have this done every 1-3 years. Abdominal aortic aneurysm (AAA) screening. You may need this if you are a current or former smoker. Osteoporosis. You may be screened starting at age 37 if you are at high risk. Talk with your health care provider about your test results, treatment options, and if necessary, the need for more tests. Vaccines  Your health care provider may recommend certain vaccines, such as: Influenza vaccine. This is recommended every year. Tetanus, diphtheria, and acellular pertussis (Tdap, Td) vaccine. You may need a Td booster every 10 years. Zoster vaccine. You may need this after age 51. Pneumococcal 13-valent conjugate (PCV13) vaccine. One dose is recommended after age 47. Pneumococcal polysaccharide (PPSV23) vaccine. One dose is recommended after age 88. Talk to your health care provider about which screenings and vaccines you need  and how often you need them. This  information is not intended to replace advice given to you by your health care provider. Make sure you discuss any questions you have with your health care provider. Document Released: 11/11/2015 Document Revised: 07/04/2016 Document Reviewed: 08/16/2015 Elsevier Interactive Patient Education  2017 [place] Prevention in the Home Falls can cause injuries. They can happen to people of all ages. There are many things you can do to make your home safe and to help prevent falls. What can I do on the outside of my home? Regularly fix the edges of walkways and driveways and fix any cracks. Remove anything that might make you trip as you walk through a door, such as a raised step or threshold. Trim any bushes or trees on the path to your home. Use bright outdoor lighting. Clear any walking paths of anything that might make someone trip, such as rocks or tools. Regularly check to see if handrails are loose or broken. Make sure that both sides of any steps have handrails. Any raised decks and porches should have guardrails on the edges. Have any leaves, snow, or ice cleared regularly. Use sand or salt on walking paths during winter. Clean up any spills in your garage right away. This includes oil or grease spills. What can I do in the bathroom? Use night lights. Install grab bars by the toilet and in the tub and shower. Do not use towel bars as grab bars. Use non-skid mats or decals in the tub or shower. If you need to sit down in the shower, use a plastic, non-slip stool. Keep the floor dry. Clean up any water that spills on the floor as soon as it happens. Remove soap buildup in the tub or shower regularly. Attach bath mats securely with double-sided non-slip rug tape. Do not have throw rugs and other things on the floor that can make you trip. What can I do in the bedroom? Use night lights. Make sure that you have a light by your bed that is easy to reach. Do not use any sheets or  blankets that are too big for your bed. They should not hang down onto the floor. Have a firm chair that has side arms. You can use this for support while you get dressed. Do not have throw rugs and other things on the floor that can make you trip. What can I do in the kitchen? Clean up any spills right away. Avoid walking on wet floors. Keep items that you use a lot in easy-to-reach places. If you need to reach something above you, use a strong step stool that has a grab bar. Keep electrical cords out of the way. Do not use floor polish or wax that makes floors slippery. If you must use wax, use non-skid floor wax. Do not have throw rugs and other things on the floor that can make you trip. What can I do with my stairs? Do not leave any items on the stairs. Make sure that there are handrails on both sides of the stairs and use them. Fix handrails that are broken or loose. Make sure that handrails are as long as the stairways. Check any carpeting to make sure that it is firmly attached to the stairs. Fix any carpet that is loose or worn. Avoid having throw rugs at the top or bottom of the stairs. If you do have throw rugs, attach them to the floor with carpet tape. Make sure that you  have a light switch at the top of the stairs and the bottom of the stairs. If you do not have them, ask someone to add them for you. What else can I do to help prevent falls? Wear shoes that: Do not have high heels. Have rubber bottoms. Are comfortable and fit you well. Are closed at the toe. Do not wear sandals. If you use a stepladder: Make sure that it is fully opened. Do not climb a closed stepladder. Make sure that both sides of the stepladder are locked into place. Ask someone to hold it for you, if possible. Clearly mark and make sure that you can see: Any grab bars or handrails. First and last steps. Where the edge of each step is. Use tools that help you move around (mobility aids) if they are  needed. These include: Canes. Walkers. Scooters. Crutches. Turn on the lights when you go into a dark area. Replace any light bulbs as soon as they burn out. Set up your furniture so you have a clear path. Avoid moving your furniture around. If any of your floors are uneven, fix them. If there are any pets around you, be aware of where they are. Review your medicines with your doctor. Some medicines can make you feel dizzy. This can increase your chance of falling. Ask your doctor what other things that you can do to help prevent falls. This information is not intended to replace advice given to you by your health care provider. Make sure you discuss any questions you have with your health care provider. Document Released: 08/11/2009 Document Revised: 03/22/2016 Document Reviewed: 11/19/2014 Elsevier Interactive Patient Education  2017 [employer].

## 2021-08-21 ENCOUNTER — Telehealth: Payer: Self-pay | Admitting: Registered Nurse

## 2021-08-21 DIAGNOSIS — J432 Centrilobular emphysema: Secondary | ICD-10-CM

## 2021-08-21 MED ORDER — ALBUTEROL SULFATE HFA 108 (90 BASE) MCG/ACT IN AERS: INHALATION_SPRAY | RESPIRATORY_TRACT | 5 refills | Status: DC

## 2021-08-21 MED ORDER — ARNUITY ELLIPTA 200 MCG/ACT IN AEPB: 1.0000 | INHALATION_SPRAY | Freq: Every day | RESPIRATORY_TRACT | 3 refills | Status: DC

## 2021-08-21 NOTE — Telephone Encounter (Signed)
..  Caller name:Travis Lawrence  Caller callback [phone]  Encourage patient to contact the pharmacy for refills or they can request refills through [facility]  (Please schedule appointment if patient has not been seen in over a year)  MEDICATION NAME & DOSE: Rnuity Eliptica & Albuterol Inhaler  Notes/Comments from patient:  WHAT PHARMACY WOULD THEY LIKE THIS SENT TO:   [facility] on [address] in [place]  Please notify patient: It takes 48-72 hours to process rx refill requests Ask patient to call pharmacy to ensure rx is ready before heading there.   (CLINICAL TO FILL OR ROUTE PER PROTOCOLS)

## 2021-08-21 NOTE — Telephone Encounter (Signed)
Refills sent

## 2021-09-17 NOTE — Progress Notes (Signed)
Established Patient Office Visit  Subjective:  Patient ID: Travis Lawrence, male    DOB: 06-26-1956  Age: 65 y.o. MRN: [id number]  CC:  Chief Complaint  Patient presents with   Transitions Of Care    Patient states he is here for a TOC and also an CPE    HPI Travis Lawrence presents for CPE and to est care.  Histories reviewed and updated with patient.   Notable smoking hx of greater than 40 pack years, current smoker. No screening in past. No red flags today.   Otherwise no acute concerns.   Past Medical History:  Diagnosis Date   GERD (gastroesophageal reflux disease)    Hypertension    Hypertriglyceridemia    Schizophrenia (HCC)     Past Surgical History:  Procedure Laterality Date   CHOLECYSTECTOMY N/A 05/30/2015   Procedure: LAPAROSCOPIC SUBTOTAL CHOLECYSTECTOMY  ;  Surgeon: Gaynelle Adu, MD;  Location: [facility];  Service: General;  Laterality: N/A;   TONSILLECTOMY      Family History  Problem Relation Age of Onset   Esophageal cancer Mother    Lung cancer Father    Heart attack Father    Heart attack Brother    Lung cancer Sister     Social History   Socioeconomic History   Marital status: Single    Spouse name: Not on file   Number of children: Not on file   Years of education: Not on file   Highest education level: Not on file  Occupational History   Not on file  Tobacco Use   Smoking status: Every Day    Packs/day: 1.00    Years: 41.00    Pack years: 41.00    Types: Cigars, Cigarettes   Smokeless tobacco: Never  Vaping Use   Vaping Use: Never used  Substance and Sexual Activity   Alcohol use: No    Alcohol/week: 0.0 standard drinks   Drug use: No   Sexual activity: Yes  Other Topics Concern   Not on file  Social History Narrative   Not on file   Social Determinants of Health   Financial Resource Strain: Low Risk    Difficulty of Paying Living Expenses: Not hard at all  Food Insecurity: No Food Insecurity   Worried About Community education officer in the Last Year: Never true   Ran Out of Food in the Last Year: Never true  Transportation Needs: No Transportation Needs   Lack of Transportation (Medical): No   Lack of Transportation (Non-Medical): No  Physical Activity: Sufficiently Active   Days of Exercise per Week: 4 days   Minutes of Exercise per Session: 40 min  Stress: No Stress Concern Present   Feeling of Stress : Not at all  Social Connections: Moderately Isolated   Frequency of Communication with Friends and Family: More than three times a week   Frequency of Social Gatherings with Friends and Family: More than three times a week   Attends Religious Services: More than 4 times per year   Active Member of [employer] or Organizations: No   Attends Banker Meetings: Never   Marital Status: Never married  Catering manager Violence: Not At Risk   Fear of Current or Ex-Partner: No   Emotionally Abused: No   Physically Abused: No   Sexually Abused: No    Outpatient Medications Prior to Visit  Medication Sig Dispense Refill   haloperidol decanoate (HALDOL DECANOATE) 100 MG/ML injection Inject 100 mg into  the muscle every 28 (twenty-eight) days.     No facility-administered medications prior to visit.    Allergies  Allergen Reactions   Penicillins     REACTION: rash    ROS Review of Systems  Constitutional: Negative.   HENT: Negative.    Eyes: Negative.   Respiratory: Negative.    Cardiovascular: Negative.   Gastrointestinal: Negative.   Genitourinary: Negative.   Musculoskeletal: Negative.   Skin: Negative.   Neurological: Negative.   Psychiatric/Behavioral: Negative.    All other systems reviewed and are negative.    Objective:    Physical Exam Vitals and nursing note reviewed.  Constitutional:      General: He is not in acute distress.    Appearance: Normal appearance. He is normal weight. He is not ill-appearing, toxic-appearing or diaphoretic.  HENT:     Head: Normocephalic  and atraumatic.     Right Ear: Tympanic membrane, ear canal and external ear normal. There is no impacted cerumen.     Left Ear: Tympanic membrane, ear canal and external ear normal. There is no impacted cerumen.     Nose: Nose normal. No congestion or rhinorrhea.     Mouth/Throat:     Mouth: Mucous membranes are moist.     Pharynx: Oropharynx is clear. No oropharyngeal exudate or posterior oropharyngeal erythema.  Eyes:     General: No scleral icterus.       Right eye: No discharge.        Left eye: No discharge.     Extraocular Movements: Extraocular movements intact.     Conjunctiva/sclera: Conjunctivae normal.     Pupils: Pupils are equal, round, and reactive to light.  Neck:     Vascular: No carotid bruit.  Cardiovascular:     Rate and Rhythm: Normal rate and regular rhythm.     Pulses: Normal pulses.     Heart sounds: Normal heart sounds. No murmur heard.   No friction rub. No gallop.  Pulmonary:     Effort: Pulmonary effort is normal. No respiratory distress.     Breath sounds: No stridor. Wheezing (panlobular) present. No rhonchi or rales.  Chest:     Chest wall: No tenderness.  Abdominal:     General: Abdomen is flat. Bowel sounds are normal. There is no distension.     Palpations: Abdomen is soft. There is no mass.     Tenderness: There is no abdominal tenderness. There is no right CVA tenderness, left CVA tenderness, guarding or rebound.     Hernia: No hernia is present.  Musculoskeletal:        General: No swelling, tenderness, deformity or signs of injury. Normal range of motion.     Cervical back: Normal range of motion and neck supple. No rigidity or tenderness.     Right lower leg: No edema.     Left lower leg: No edema.  Lymphadenopathy:     Cervical: No cervical adenopathy.  Skin:    General: Skin is warm and dry.     Capillary Refill: Capillary refill takes less than 2 seconds.     Coloration: Skin is not jaundiced or pale.     Findings: No bruising,  erythema, lesion or rash.  Neurological:     General: No focal deficit present.     Mental Status: He is alert and oriented to person, place, and time. Mental status is at baseline.     Cranial Nerves: No cranial nerve deficit.     Motor: No  weakness.     Gait: Gait normal.  Psychiatric:        Mood and Affect: Mood normal.        Behavior: Behavior normal.        Thought Content: Thought content normal.        Judgment: Judgment normal.    BP (!) 146/95   Pulse 74   Temp 98 F (36.7 C) (Temporal)   Resp 18   Ht 6' (1.829 m)   Wt 166 lb (75.3 kg)   SpO2 92%   BMI 22.51 kg/m  Wt Readings from Last 3 Encounters:  02/14/21 164 lb 4.8 oz (74.5 kg)  12/21/20 166 lb (75.3 kg)  09/11/18 175 lb (79.4 kg)     Health Maintenance Due  Topic Date Due   Pneumonia Vaccine 55+ Years old (2 - PCV) 11/19/2016   COVID-19 Vaccine (5 - Booster for Pfizer series) 03/25/2021   INFLUENZA VACCINE  05/29/2021    There are no preventive care reminders to display for this patient.  Lab Results  Component Value Date   TSH 1.840 12/21/2020   Lab Results  Component Value Date   WBC 8.6 12/21/2020   HGB 17.4 12/21/2020   HCT 50.1 12/21/2020   MCV 95 12/21/2020   PLT 253 12/21/2020   Lab Results  Component Value Date   NA 138 12/21/2020   K 4.0 12/21/2020   CO2 22 12/21/2020   GLUCOSE 89 12/21/2020   BUN 15 12/21/2020   CREATININE 1.06 12/21/2020   BILITOT 0.4 12/21/2020   ALKPHOS 124 (H) 12/21/2020   AST 13 12/21/2020   ALT 7 12/21/2020   PROT 6.6 12/21/2020   ALBUMIN 4.0 12/21/2020   CALCIUM 9.7 12/21/2020   ANIONGAP 8 06/01/2015   Lab Results  Component Value Date   CHOL 194 12/21/2020   Lab Results  Component Value Date   HDL 49 12/21/2020   Lab Results  Component Value Date   LDLCALC 123 (H) 12/21/2020   Lab Results  Component Value Date   TRIG 124 12/21/2020   Lab Results  Component Value Date   CHOLHDL 4.0 12/21/2020   Lab Results  Component Value Date    HGBA1C 5.5 12/21/2020      Assessment & Plan:   Problem List Items Addressed This Visit   None Visit Diagnoses     Routine general medical examination at a health care facility    -  Primary   Relevant Orders   Lipid panel (Completed)   TSH (Completed)   Hemoglobin A1c (Completed)   Comprehensive metabolic panel (Completed)   CBC with Differential (Completed)   Encounter to establish care           No orders of the defined types were placed in this encounter.   Follow-up: No follow-ups on file.   PLAN Labs collected. Will follow up with the patient as warranted. Wheezing on exam. Suspect COPD. No distinct areas of consolidation. Will order CT lung low dose Otherwise exam unremarkable Return in 1 year or pending labs Patient encouraged to call clinic with any questions, comments, or concerns.  Janeece Agee, NP

## 2021-09-19 ENCOUNTER — Other Ambulatory Visit: Payer: Self-pay | Admitting: *Deleted

## 2021-09-19 DIAGNOSIS — Z87891 Personal history of nicotine dependence: Secondary | ICD-10-CM

## 2021-09-19 DIAGNOSIS — R911 Solitary pulmonary nodule: Secondary | ICD-10-CM

## 2021-09-19 DIAGNOSIS — F17210 Nicotine dependence, cigarettes, uncomplicated: Secondary | ICD-10-CM

## 2021-09-25 ENCOUNTER — Ambulatory Visit (HOSPITAL_BASED_OUTPATIENT_CLINIC_OR_DEPARTMENT_OTHER): Admission: RE | Admit: 2021-09-25 | Payer: Medicare Other | Source: Ambulatory Visit

## 2021-09-25 ENCOUNTER — Ambulatory Visit (INDEPENDENT_AMBULATORY_CARE_PROVIDER_SITE_OTHER): Payer: Medicare Other | Admitting: Primary Care

## 2021-09-25 ENCOUNTER — Telehealth: Payer: Self-pay | Admitting: Primary Care

## 2021-09-25 NOTE — Progress Notes (Signed)
Attempted to call patient several times without answer. Left message. Patient will need to reschedule.

## 2021-09-25 NOTE — Telephone Encounter (Signed)
Spoke with pt. He states that he currently has the Flu and needs to reschedule lung screening.  SDMV is 10/18/21 11:00 CT is 10/18/21 4:30.  Appt letter mailed to pt.

## 2021-09-25 NOTE — Patient Instructions (Signed)
Thank you for participating in the  Lung Cancer Screening Program. °It was our pleasure to meet you today. °We will call you with the results of your scan within the next few days. °Your scan will be assigned a Lung RADS category score by the physicians reading the scans.  °This Lung RADS score determines follow up scanning.  °See below for description of categories, and follow up screening recommendations. °We will be in touch to schedule your follow up screening annually or based on recommendations of our providers. °We will fax a copy of your scan results to your Primary Care Physician, or the physician who referred you to the program, to ensure they have the results. °Please call the office if you have any questions or concerns regarding your scanning experience or results.  °Our office number is [phone]. °Please speak with Denise Phelps, RN. She is our Lung Cancer Screening RN. °If she is unavailable when you call, please have the office staff send her a message. She will return your call at her earliest convenience. °Remember, if your scan is normal, we will scan you annually as long as you continue to meet the criteria for the program. (Age 66-77, Current smoker or smoker who has quit within the last 15 years). °If you are a smoker, remember, quitting is the single most powerful action that you can take to decrease your risk of lung cancer and other pulmonary, breathing related problems. °We know quitting is hard, and we are here to help.  °Please let us know if there is anything we can do to help you meet your goal of quitting. °If you are a former smoker, congratulations. We are proud of you! Remain smoke free! °Remember you can refer friends or family members through the number above.  °We will screen them to make sure they meet criteria for the program. °Thank you for helping us take better care of you by participating in Lung Screening. ° °You can receive free nicotine replacement therapy  ( patches, gum or mints) by calling 1-800-QUIT NOW. Please call so we can get you on the path to becoming  a non-smoker. I know it is hard, but you can do this! ° °Lung RADS Categories: ° °Lung RADS 1: no nodules or definitely non-concerning nodules.  °Recommendation is for a repeat annual scan in 12 months. ° °Lung RADS 2:  nodules that are non-concerning in appearance and behavior with a very low likelihood of becoming an active cancer. °Recommendation is for a repeat annual scan in 12 months. ° °Lung RADS 3: nodules that are probably non-concerning , includes nodules with a low likelihood of becoming an active cancer.  Recommendation is for a 6-month repeat screening scan. Often noted after an upper respiratory illness. We will be in touch to make sure you have no questions, and to schedule your 6-month scan. ° °Lung RADS 4 A: nodules with concerning findings, recommendation is most often for a follow up scan in 3 months or additional testing based on our provider's assessment of the scan. We will be in touch to make sure you have no questions and to schedule the recommended 3 month follow up scan. ° °Lung RADS 4 B:  indicates findings that are concerning. We will be in touch with you to schedule additional diagnostic testing based on our provider's  assessment of the scan. ° °Hypnosis for smoking cessation  °[employer] °[phone] ° °Acupuncture for smoking cessation  °[facility] °[phone]  °

## 2021-09-25 NOTE — Telephone Encounter (Signed)
Attempted to call patient several times without answer. Left message. Patient will need to reschedule.

## 2021-10-18 ENCOUNTER — Encounter: Payer: Self-pay | Admitting: Acute Care

## 2021-10-18 ENCOUNTER — Ambulatory Visit (INDEPENDENT_AMBULATORY_CARE_PROVIDER_SITE_OTHER): Payer: Medicare Other | Admitting: Acute Care

## 2021-10-18 ENCOUNTER — Other Ambulatory Visit: Payer: Self-pay

## 2021-10-18 ENCOUNTER — Ambulatory Visit (HOSPITAL_BASED_OUTPATIENT_CLINIC_OR_DEPARTMENT_OTHER)
Admission: RE | Admit: 2021-10-18 | Discharge: 2021-10-18 | Disposition: A | Discharge status: Home / Self Care | Payer: Medicare Other | Source: Ambulatory Visit | Attending: Acute Care | Admitting: Acute Care

## 2021-10-18 DIAGNOSIS — R911 Solitary pulmonary nodule: Secondary | ICD-10-CM | POA: Insufficient documentation

## 2021-10-18 DIAGNOSIS — F17210 Nicotine dependence, cigarettes, uncomplicated: Secondary | ICD-10-CM | POA: Diagnosis not present

## 2021-10-18 DIAGNOSIS — R918 Other nonspecific abnormal finding of lung field: Secondary | ICD-10-CM | POA: Diagnosis not present

## 2021-10-18 DIAGNOSIS — I700 Atherosclerosis of aorta: Secondary | ICD-10-CM | POA: Diagnosis not present

## 2021-10-18 DIAGNOSIS — J439 Emphysema, unspecified: Secondary | ICD-10-CM | POA: Diagnosis not present

## 2021-10-18 DIAGNOSIS — Z87891 Personal history of nicotine dependence: Secondary | ICD-10-CM | POA: Diagnosis not present

## 2021-10-18 NOTE — Patient Instructions (Signed)
Thank you for participating in the [place] Lung Cancer Screening Program. °It was our pleasure to meet you today. °We will call you with the results of your scan within the next few days. °Your scan will be assigned a Lung RADS category score by the physicians reading the scans.  °This Lung RADS score determines follow up scanning.  °See below for description of categories, and follow up screening recommendations. °We will be in touch to schedule your follow up screening annually or based on recommendations of our providers. °We will fax a copy of your scan results to your Primary Care Physician, or the physician who referred you to the program, to ensure they have the results. °Please call the office if you have any questions or concerns regarding your scanning experience or results.  °Our office number is [phone]. °Please speak with Denise Phelps, RN. She is our Lung Cancer Screening RN. °If she is unavailable when you call, please have the office staff send her a message. She will return your call at her earliest convenience. °Remember, if your scan is normal, we will scan you annually as long as you continue to meet the criteria for the program. (Age 55-77, Current smoker or smoker who has quit within the last 15 years). °If you are a smoker, remember, quitting is the single most powerful action that you can take to decrease your risk of lung cancer and other pulmonary, breathing related problems. °We know quitting is hard, and we are here to help.  °Please let us know if there is anything we can do to help you meet your goal of quitting. °If you are a former smoker, congratulations. We are proud of you! Remain smoke free! °Remember you can refer friends or family members through the number above.  °We will screen them to make sure they meet criteria for the program. °Thank you for helping us take better care of you by participating in Lung Screening. ° °You can receive free nicotine replacement therapy  ( patches, gum or mints) by calling 1-800-QUIT NOW. Please call so we can get you on the path to becoming  a non-smoker. I know it is hard, but you can do this! ° °Lung RADS Categories: ° °Lung RADS 1: no nodules or definitely non-concerning nodules.  °Recommendation is for a repeat annual scan in 12 months. ° °Lung RADS 2:  nodules that are non-concerning in appearance and behavior with a very low likelihood of becoming an active cancer. °Recommendation is for a repeat annual scan in 12 months. ° °Lung RADS 3: nodules that are probably non-concerning , includes nodules with a low likelihood of becoming an active cancer.  Recommendation is for a 6-month repeat screening scan. Often noted after an upper respiratory illness. We will be in touch to make sure you have no questions, and to schedule your 6-month scan. ° °Lung RADS 4 A: nodules with concerning findings, recommendation is most often for a follow up scan in 3 months or additional testing based on our provider's assessment of the scan. We will be in touch to make sure you have no questions and to schedule the recommended 3 month follow up scan. ° °Lung RADS 4 B:  indicates findings that are concerning. We will be in touch with you to schedule additional diagnostic testing based on our provider's  assessment of the scan. ° °Hypnosis for smoking cessation  °[employer] °[phone] ° °Acupuncture for smoking cessation  °[facility] °[phone]  °

## 2021-10-18 NOTE — Progress Notes (Signed)
Virtual Visit via Telephone Note  I connected with Travis Lawrence on 10/18/21 at 11:00 AM EST by telephone and verified that I am speaking with the correct person using two identifiers.  Location: Patient: At home Provider: [address] [place], [place], [address]    I discussed the limitations, risks, security and privacy concerns of performing an evaluation and management service by telephone and the availability of in person appointments. I also discussed with the patient that there may be a patient responsible charge related to this service. The patient expressed understanding and agreed to proceed.     Shared Decision Making Visit Lung Cancer Screening Program [phone])   Eligibility: Age 65 y.o. Pack Years Smoking History Calculation 41 pack year smoking history (# packs/per year x # years smoked) Recent History of coughing up blood  no Unexplained weight loss? no ( >Than 15 pounds within the last 6 months ) Prior History Lung / other cancer no (Diagnosis within the last 5 years already requiring surveillance chest CT Scans). Smoking Status Current Smoker Former Smokers: Years since quit: NA  Quit Date: NA  Visit Components: Discussion included one or more decision making aids. yes Discussion included risk/benefits of screening. yes Discussion included potential follow up diagnostic testing for abnormal scans. yes Discussion included meaning and risk of over diagnosis. yes Discussion included meaning and risk of False Positives. yes Discussion included meaning of total radiation exposure. yes  Counseling Included: Importance of adherence to annual lung cancer LDCT screening. yes Impact of comorbidities on ability to participate in the program. yes Ability and willingness to under diagnostic treatment. yes  Smoking Cessation Counseling: Current Smokers:  Discussed importance of smoking cessation. yes Information about tobacco cessation classes and  interventions provided to patient. yes Patient provided with "ticket" for LDCT Scan. yes Symptomatic Patient. no  Counseling NA Diagnosis Code: Tobacco Use Z72.0 Asymptomatic Patient yes  Counseling (Intermediate counseling: > three minutes counseling) [id number] Former Smokers:  Discussed the importance of maintaining cigarette abstinence. yes Diagnosis Code: Personal History of Nicotine Dependence. G40.102 Information about tobacco cessation classes and interventions provided to patient. Yes Patient provided with "ticket" for LDCT Scan. yes Written Order for Lung Cancer Screening with LDCT placed in Epic. Yes (CT Chest Lung Cancer Screening Low Dose W/O CM) [id number] Z12.2-Screening of respiratory organs Z87.891-Personal history of nicotine dependence  I have spent 25 minutes of face to face/ virtual visit   time with Travis Lawrence discussing the risks and benefits of lung cancer screening. We viewed / discussed a power point together that explained in detail the above noted topics. We paused at intervals to allow for questions to be asked and answered to ensure understanding.We discussed that the single most powerful action that he can take to decrease his risk of developing lung cancer is to quit smoking. We discussed whether or not he is ready to commit to setting a quit date. We discussed options for tools to aid in quitting smoking including nicotine replacement therapy, non-nicotine medications, support groups, Quit Smart classes, and behavior modification. We discussed that often times setting smaller, more achievable goals, such as eliminating 1 cigarette a day for a week and then 2 cigarettes a day for a week can be helpful in slowly decreasing the number of cigarettes smoked. This allows for a sense of accomplishment as well as providing a clinical benefit. I provided  him  with smoking cessation  information  with contact information for community resources, classes, free nicotine replacement  therapy, and access to mobile apps, text messaging, and on-line smoking cessation help. I have also provided  him  the office contact information in the event he needs to contact me, or the screening staff. We discussed the time and location of the scan, and that either Travis Miyamoto RN, Travis Lemon, RN  or I will call / send a letter with the results within 24-72 hours of receiving them. The patient verbalized understanding of all of  the above and had no further questions upon leaving the office. They have my contact information in the event they have any further questions.  I spent 3 minutes counseling on smoking cessation and the health risks of continued tobacco abuse.  I explained to the patient that there has been a high incidence of coronary artery disease noted on these exams. I explained that this is a non-gated exam therefore degree or severity cannot be determined. This patient is not on statin therapy. I have asked the patient to follow-up with their PCP regarding any incidental finding of coronary artery disease and management with diet or medication as their PCP  feels is clinically indicated. The patient verbalized understanding of the above and had no further questions upon completion of the visit.      Travis Ngo, NP 10/18/2021

## 2021-11-06 ENCOUNTER — Other Ambulatory Visit: Payer: Self-pay

## 2021-11-06 ENCOUNTER — Telehealth: Payer: Self-pay | Admitting: Acute Care

## 2021-11-06 DIAGNOSIS — Z87891 Personal history of nicotine dependence: Secondary | ICD-10-CM

## 2021-11-06 DIAGNOSIS — F17210 Nicotine dependence, cigarettes, uncomplicated: Secondary | ICD-10-CM

## 2021-11-06 NOTE — Telephone Encounter (Signed)
Contacted patient to review CT results.  Patient states he had received a letter in the mail and no further questions

## 2022-02-03 ENCOUNTER — Other Ambulatory Visit: Payer: Self-pay | Admitting: Registered Nurse

## 2022-02-03 DIAGNOSIS — J432 Centrilobular emphysema: Secondary | ICD-10-CM

## 2022-04-10 ENCOUNTER — Telehealth: Payer: Self-pay

## 2022-04-10 NOTE — Telephone Encounter (Signed)
Called pt to schedule AWV with health coach, no answer, LM to call back to schedule said appointment

## 2022-04-13 NOTE — Telephone Encounter (Signed)
I was told to forward patients I have been unable to schedule for AWV to you

## 2022-04-16 NOTE — Telephone Encounter (Signed)
Left message for patient to call back and schedule Medicare Annual Wellness Visit (AWV) after 07/24/22.   Please offer to do virtually or by telephone.  Left office number and my jabber [phone].  Last AWV:07/24/21  Please schedule  with Nurse Health Advisor.  Patient hasn't been seen in over a year by his provider.  Please schedule follow up appointment with provider before AWV.

## 2022-07-18 ENCOUNTER — Telehealth: Payer: Self-pay

## 2022-07-18 ENCOUNTER — Other Ambulatory Visit: Payer: Self-pay

## 2022-07-18 DIAGNOSIS — J432 Centrilobular emphysema: Secondary | ICD-10-CM

## 2022-07-18 MED ORDER — ARNUITY ELLIPTA 200 MCG/ACT IN AEPB: INHALATION_SPRAY | RESPIRATORY_TRACT | 3 refills | Status: DC

## 2022-07-24 NOTE — Telephone Encounter (Signed)
Orders were entered that day.

## 2022-07-26 DIAGNOSIS — F209 Schizophrenia, unspecified: Secondary | ICD-10-CM | POA: Diagnosis not present

## 2022-09-10 DIAGNOSIS — H353131 Nonexudative age-related macular degeneration, bilateral, early dry stage: Secondary | ICD-10-CM | POA: Diagnosis not present

## 2022-09-25 ENCOUNTER — Encounter: Payer: Self-pay | Admitting: Family

## 2022-09-25 ENCOUNTER — Ambulatory Visit (INDEPENDENT_AMBULATORY_CARE_PROVIDER_SITE_OTHER): Payer: Medicare (Managed Care) | Admitting: Family

## 2022-09-25 VITALS — BP 124/70 | HR 80 | Temp 97.7°F | Ht 72.0 in | Wt 169.8 lb

## 2022-09-25 DIAGNOSIS — F17210 Nicotine dependence, cigarettes, uncomplicated: Secondary | ICD-10-CM | POA: Diagnosis not present

## 2022-09-25 DIAGNOSIS — J432 Centrilobular emphysema: Secondary | ICD-10-CM | POA: Diagnosis not present

## 2022-09-25 DIAGNOSIS — I10 Essential (primary) hypertension: Secondary | ICD-10-CM | POA: Diagnosis not present

## 2022-09-25 DIAGNOSIS — K219 Gastro-esophageal reflux disease without esophagitis: Secondary | ICD-10-CM

## 2022-09-25 DIAGNOSIS — E781 Pure hyperglyceridemia: Secondary | ICD-10-CM | POA: Diagnosis not present

## 2022-09-25 LAB — COMPREHENSIVE METABOLIC PANEL
ALT: 8 U/L (ref 0–53)
AST: 12 U/L (ref 0–37)
Albumin: 4 g/dL (ref 3.5–5.2)
Alkaline Phosphatase: 98 U/L (ref 39–117)
BUN: 8 mg/dL (ref 6–23)
CO2: 30 mEq/L (ref 19–32)
Calcium: 8.9 mg/dL (ref 8.4–10.5)
Chloride: 102 mEq/L (ref 96–112)
Creatinine, Ser: 0.99 mg/dL (ref 0.40–1.50)
GFR: 79.12 mL/min (ref >60.00)
Glucose, Bld: 80 mg/dL (ref 70–99)
Potassium: 3.9 mEq/L (ref 3.5–5.1)
Sodium: 139 mEq/L (ref 135–145)
Total Bilirubin: 0.6 mg/dL (ref 0.2–1.2)
Total Protein: 6.7 g/dL (ref 6.0–8.3)

## 2022-09-25 LAB — CBC WITH DIFFERENTIAL/PLATELET
Basophils Absolute: 0.1 10*3/uL (ref 0.0–0.1)
Basophils Relative: 0.9 % (ref 0.0–3.0)
Eosinophils Absolute: 0.2 10*3/uL (ref 0.0–0.7)
Eosinophils Relative: 3.6 % (ref 0.0–5.0)
HCT: 48.9 % (ref 39.0–52.0)
Hemoglobin: 16.9 g/dL (ref 13.0–17.0)
Lymphocytes Relative: 32.6 % (ref 12.0–46.0)
Lymphs Abs: 2.2 10*3/uL (ref 0.7–4.0)
MCHC: 34.5 g/dL (ref 30.0–36.0)
MCV: 98.2 fl (ref 78.0–100.0)
Monocytes Absolute: 0.5 10*3/uL (ref 0.1–1.0)
Monocytes Relative: 7.7 % (ref 3.0–12.0)
Neutro Abs: 3.7 10*3/uL (ref 1.4–7.7)
Neutrophils Relative %: 55.2 % (ref 43.0–77.0)
Platelets: 287 10*3/uL (ref 150.0–400.0)
RBC: 4.98 Mil/uL (ref 4.22–5.81)
RDW: 13.8 % (ref 11.5–15.5)
WBC: 6.8 10*3/uL (ref 4.0–10.5)

## 2022-09-25 LAB — LIPID PANEL
Cholesterol: 179 mg/dL (ref 0–200)
HDL: 42.9 mg/dL (ref >39.00)
LDL Cholesterol: 102 mg/dL — ABNORMAL HIGH (ref 0–99)
NonHDL: 136.06
Total CHOL/HDL Ratio: 4
Triglycerides: 171 mg/dL — ABNORMAL HIGH (ref 0.0–149.0)
VLDL: 34.2 mg/dL (ref 0.0–40.0)

## 2022-09-25 MED ORDER — ALBUTEROL SULFATE HFA 108 (90 BASE) MCG/ACT IN AERS: INHALATION_SPRAY | RESPIRATORY_TRACT | 5 refills | Status: DC

## 2022-09-25 MED ORDER — ARNUITY ELLIPTA 200 MCG/ACT IN AEPB: INHALATION_SPRAY | RESPIRATORY_TRACT | 3 refills | Status: DC

## 2022-09-25 NOTE — Progress Notes (Signed)
Established Patient Office Visit  Subjective   Patient ID: Travis Lawrence, male    DOB: [birth year]  Age: 66 y.o. MRN: [id number]  Chief Complaint  Patient presents with   Medication Refill    HPI 66 year old male with a history of COPD, HTN, HLD, GERD and tobacco abuse presents today for a recheck and refills on medications. Patient reports he is doing well. He is winded at times but this has been his normal for years. He continues to smoke but reports decreasing to 1/2ppd of cigarettes. Does not routinely exercise.   Review of Systems  Respiratory:  Positive for shortness of breath.        Chronic SOB, unchanged for years  All other systems reviewed and are negative.  Past Medical History:  Diagnosis Date   GERD (gastroesophageal reflux disease)    Hypertension    Hypertriglyceridemia    Schizophrenia (HCC)     Social History   Socioeconomic History   Marital status: Single    Spouse name: Not on file   Number of children: Not on file   Years of education: Not on file   Highest education level: Not on file  Occupational History   Not on file  Tobacco Use   Smoking status: Every Day    Packs/day: 1.00    Years: 41.00    Total pack years: 41.00    Types: Cigars, Cigarettes   Smokeless tobacco: Never  Vaping Use   Vaping Use: Never used  Substance and Sexual Activity   Alcohol use: No    Alcohol/week: 0.0 standard drinks of alcohol   Drug use: No   Sexual activity: Yes  Other Topics Concern   Not on file  Social History Narrative   Not on file   Social Determinants of Health   Financial Resource Strain: Low Risk  (07/24/2021)   Overall Financial Resource Strain (CARDIA)    Difficulty of Paying Living Expenses: Not hard at all  Food Insecurity: No Food Insecurity (07/24/2021)   Hunger Vital Sign    Worried About Running Out of Food in the Last Year: Never true    Ran Out of Food in the Last Year: Never true  Transportation Needs: No Transportation Needs  (07/24/2021)   PRAPARE - Administrator, Civil Service (Medical): No    Lack of Transportation (Non-Medical): No  Physical Activity: Sufficiently Active (07/24/2021)   Exercise Vital Sign    Days of Exercise per Week: 4 days    Minutes of Exercise per Session: 40 min  Stress: No Stress Concern Present (07/24/2021)   [employer] of Occupational Health - Occupational Stress Questionnaire    Feeling of Stress : Not at all  Social Connections: Moderately Isolated (07/24/2021)   Social Connection and Isolation Panel [NHANES]    Frequency of Communication with Friends and Family: More than three times a week    Frequency of Social Gatherings with Friends and Family: More than three times a week    Attends Religious Services: More than 4 times per year    Active Member of [employer] or Organizations: No    Attends Banker Meetings: Never    Marital Status: Never married  Intimate Partner Violence: Not At Risk (07/24/2021)   Humiliation, Afraid, Rape, and Kick questionnaire    Fear of Current or Ex-Partner: No    Emotionally Abused: No    Physically Abused: No    Sexually Abused: No    Past  Surgical History:  Procedure Laterality Date   CHOLECYSTECTOMY N/A 05/30/2015   Procedure: LAPAROSCOPIC SUBTOTAL CHOLECYSTECTOMY  ;  Surgeon: Gaynelle Adu, MD;  Location: [facility];  Service: General;  Laterality: N/A;   TONSILLECTOMY      Family History  Problem Relation Age of Onset   Esophageal cancer Mother    Lung cancer Father    Heart attack Father    Heart attack Brother    Lung cancer Sister     Allergies  Allergen Reactions   Penicillin G Other (See Comments)   Penicillins     REACTION: rash    Current Outpatient Medications on File Prior to Visit  Medication Sig Dispense Refill   aspirin EC 81 MG tablet Take 81 mg by mouth daily. Swallow whole.     haloperidol decanoate (HALDOL DECANOATE) 100 MG/ML injection Inject 100 mg into the muscle every 28 (twenty-eight)  days.     Fluticasone Propionate, Inhal, (FLOVENT DISKUS) 100 MCG/BLIST AEPB Inhale 1 puff into the lungs in the morning and at bedtime. (Patient not taking: Reported on 07/24/2021) 180 each 0   No current facility-administered medications on file prior to visit.    BP 124/70   Pulse 80   Temp 97.7 F (36.5 C)   Ht 6' (1.829 m)   Wt 169 lb 12.8 oz (77 kg)   SpO2 96%   BMI 23.03 kg/m chart    Objective:     BP 124/70   Pulse 80   Temp 97.7 F (36.5 C)   Ht 6' (1.829 m)   Wt 169 lb 12.8 oz (77 kg)   SpO2 96%   BMI 23.03 kg/m    Physical Exam Vitals reviewed.  Constitutional:      Appearance: Normal appearance. He is normal weight.  HENT:     Right Ear: Tympanic membrane and ear canal normal.     Left Ear: Tympanic membrane and ear canal normal.  Cardiovascular:     Rate and Rhythm: Normal rate and regular rhythm.     Pulses: Normal pulses.     Heart sounds: Normal heart sounds.  Pulmonary:     Effort: Pulmonary effort is normal.     Breath sounds: Normal breath sounds. No wheezing.  Musculoskeletal:        General: Normal range of motion.     Cervical back: Normal range of motion and neck supple.  Skin:    General: Skin is warm and dry.  Neurological:     General: No focal deficit present.     Mental Status: He is alert and oriented to person, place, and time.  Psychiatric:        Mood and Affect: Mood normal.        Behavior: Behavior normal.      Results for orders placed or performed in visit on 09/25/22  CMP  Result Value Ref Range   Sodium 139 135 - 145 mEq/L   Potassium 3.9 3.5 - 5.1 mEq/L   Chloride 102 96 - 112 mEq/L   CO2 30 19 - 32 mEq/L   Glucose, Bld 80 70 - 99 mg/dL   BUN 8 6 - 23 mg/dL   Creatinine, Ser 1.44 0.40 - 1.50 mg/dL   Total Bilirubin 0.6 0.2 - 1.2 mg/dL   Alkaline Phosphatase 98 39 - 117 U/L   AST 12 0 - 37 U/L   ALT 8 0 - 53 U/L   Total Protein 6.7 6.0 - 8.3 g/dL   Albumin  4.0 3.5 - 5.2 g/dL   GFR 62.69 >48.54 mL/min    Calcium 8.9 8.4 - 10.5 mg/dL  CBC w/Diff  Result Value Ref Range   WBC 6.8 4.0 - 10.5 K/uL   RBC 4.98 4.22 - 5.81 Mil/uL   Hemoglobin 16.9 13.0 - 17.0 g/dL   HCT 62.7 03.5 - 00.9 %   MCV 98.2 78.0 - 100.0 fl   MCHC 34.5 30.0 - 36.0 g/dL   RDW 38.1 82.9 - 93.7 %   Platelets 287.0 150.0 - 400.0 K/uL   Neutrophils Relative % 55.2 43.0 - 77.0 %   Lymphocytes Relative 32.6 12.0 - 46.0 %   Monocytes Relative 7.7 3.0 - 12.0 %   Eosinophils Relative 3.6 0.0 - 5.0 %   Basophils Relative 0.9 0.0 - 3.0 %   Neutro Abs 3.7 1.4 - 7.7 K/uL   Lymphs Abs 2.2 0.7 - 4.0 K/uL   Monocytes Absolute 0.5 0.1 - 1.0 K/uL   Eosinophils Absolute 0.2 0.0 - 0.7 K/uL   Basophils Absolute 0.1 0.0 - 0.1 K/uL  Lipid panel  Result Value Ref Range   Cholesterol 179 0 - 200 mg/dL   Triglycerides 169.6 (H) 0.0 - 149.0 mg/dL   HDL 78.93 >81.01 mg/dL   VLDL 75.1 0.0 - 02.5 mg/dL   LDL Cholesterol 852 (H) 0 - 99 mg/dL   Total CHOL/HDL Ratio 4    NonHDL 136.06       The 10-year ASCVD risk score (Arnett DK, et al., 2019) is: 17.6%    Assessment & Plan:   Problem List Items Addressed This Visit     HYPERTRIGLYCERIDEMIA - Primary   Relevant Orders   CBC w/Diff (Completed)   Lipid panel (Completed)   GERD   Relevant Orders   CBC w/Diff (Completed)   Essential hypertension, benign   Relevant Orders   CMP (Completed)   CBC w/Diff (Completed)   Emphysema/COPD (HCC)   Relevant Medications   albuterol (VENTOLIN HFA) 108 (90 Base) MCG/ACT inhaler   Fluticasone Furoate (ARNUITY ELLIPTA) 200 MCG/ACT AEPB   Other Visit Diagnoses     Smokes with greater than 40 pack year history         Inhalers refilled.  Establish with a new PCP Call the office with any questions or concerns. Encouraged smoking cessation. Recheck in 6 months and sooner as needed. Return in about 6 months (around 03/26/2023).    Eulis Foster, FNP

## 2022-10-10 ENCOUNTER — Telehealth: Payer: Self-pay | Admitting: Acute Care

## 2022-10-10 NOTE — Telephone Encounter (Signed)
Calling to get a pre-authorization for Case # [id number]

## 2022-10-11 NOTE — Telephone Encounter (Signed)
What medication is needing a PA?

## 2022-10-18 ENCOUNTER — Ambulatory Visit (HOSPITAL_BASED_OUTPATIENT_CLINIC_OR_DEPARTMENT_OTHER)
Admission: RE | Admit: 2022-10-18 | Discharge: 2022-10-18 | Disposition: A | Discharge status: Home / Self Care | Payer: Medicare (Managed Care) | Source: Ambulatory Visit | Attending: Acute Care | Admitting: Acute Care

## 2022-10-18 DIAGNOSIS — F17210 Nicotine dependence, cigarettes, uncomplicated: Secondary | ICD-10-CM | POA: Insufficient documentation

## 2022-10-18 DIAGNOSIS — Z87891 Personal history of nicotine dependence: Secondary | ICD-10-CM | POA: Insufficient documentation

## 2022-10-25 NOTE — Telephone Encounter (Signed)
Per patient's chart, the PA was for the patient's lung cancer screening CT. Patient was able to complete CT on 12/21. Will close this encounter.

## 2022-10-26 ENCOUNTER — Other Ambulatory Visit: Payer: Self-pay | Admitting: Acute Care

## 2022-10-26 DIAGNOSIS — F17210 Nicotine dependence, cigarettes, uncomplicated: Secondary | ICD-10-CM

## 2022-10-26 DIAGNOSIS — Z87891 Personal history of nicotine dependence: Secondary | ICD-10-CM

## 2022-10-26 DIAGNOSIS — Z122 Encounter for screening for malignant neoplasm of respiratory organs: Secondary | ICD-10-CM

## 2022-11-06 ENCOUNTER — Ambulatory Visit (INDEPENDENT_AMBULATORY_CARE_PROVIDER_SITE_OTHER): Payer: Medicare Other | Admitting: Family

## 2022-11-06 ENCOUNTER — Encounter: Payer: Self-pay | Admitting: Family

## 2022-11-06 VITALS — BP 126/80 | HR 61 | Temp 97.9°F | Resp 17 | Ht 71.0 in | Wt 169.4 lb

## 2022-11-06 DIAGNOSIS — F17210 Nicotine dependence, cigarettes, uncomplicated: Secondary | ICD-10-CM | POA: Diagnosis not present

## 2022-11-06 DIAGNOSIS — E781 Pure hyperglyceridemia: Secondary | ICD-10-CM

## 2022-11-06 NOTE — Progress Notes (Signed)
Acute Office Visit  Subjective:     Patient ID: Travis Lawrence, male    DOB: [birth year], 67 y.o.   MRN: [id number]  Chief Complaint  Patient presents with  . Medication Refill    Pt states he was dx with HTN from here and wants to discuss medication      HPI Patient is in today with concerns that his blood pressure is running high. He reports checking his blood pressure at home and is seeing readings in the 120-140/80s. He continues to smoke but is trying to stop smoking cigars. He report having a voucher to get patches and plans to try them. He also would like to know if he needs to do anything about his cholesterol at this point. No current exercise.  Review of Systems  Constitutional: Negative.   Respiratory: Negative.    Cardiovascular: Negative.   Gastrointestinal: Negative.   Musculoskeletal: Negative.   Neurological: Negative.   Psychiatric/Behavioral: Negative.    All other systems reviewed and are negative. Past Medical History:  Diagnosis Date  . GERD (gastroesophageal reflux disease)   . Hypertension   . Hypertriglyceridemia   . Schizophrenia [facility])     Social History   Socioeconomic History  . Marital status: Single    Spouse name: Not on file  . Number of children: Not on file  . Years of education: Not on file  . Highest education level: Not on file  Occupational History  . Not on file  Tobacco Use  . Smoking status: Every Day    Packs/day: 1.00    Years: 41.00    Total pack years: 41.00    Types: Cigars, Cigarettes  . Smokeless tobacco: Never  Vaping Use  . Vaping Use: Never used  Substance and Sexual Activity  . Alcohol use: No    Alcohol/week: 0.0 standard drinks of alcohol  . Drug use: No  . Sexual activity: Yes  Other Topics Concern  . Not on file  Social History Narrative  . Not on file   Social Determinants of Health   Financial Resource Strain: Low Risk  (07/24/2021)   Overall Financial Resource Strain (CARDIA)   . Difficulty  of Paying Living Expenses: Not hard at all  Food Insecurity: No Food Insecurity (07/24/2021)   Hunger Vital Sign   . Worried About Charity fundraiser in the Last Year: Never true   . Ran Out of Food in the Last Year: Never true  Transportation Needs: No Transportation Needs (07/24/2021)   PRAPARE - Transportation   . Lack of Transportation (Medical): No   . Lack of Transportation (Non-Medical): No  Physical Activity: Sufficiently Active (07/24/2021)   Exercise Vital Sign   . Days of Exercise per Week: 4 days   . Minutes of Exercise per Session: 40 min  Stress: No Stress Concern Present (07/24/2021)   [place]   . Feeling of Stress : Not at all  Social Connections: Moderately Isolated (07/24/2021)   Social Connection and Isolation Panel [NHANES]   . Frequency of Communication with Friends and Family: More than three times a week   . Frequency of Social Gatherings with Friends and Family: More than three times a week   . Attends Religious Services: More than 4 times per year   . Active Member of Clubs or Organizations: No   . Attends Archivist Meetings: Never   . Marital Status: Never married  Intimate Partner Violence:  Not At Risk (07/24/2021)   Humiliation, Afraid, Rape, and Kick questionnaire   . Fear of Current or Ex-Partner: No   . Emotionally Abused: No   . Physically Abused: No   . Sexually Abused: No    Past Surgical History:  Procedure Laterality Date  . CHOLECYSTECTOMY N/A 05/30/2015   Procedure: LAPAROSCOPIC SUBTOTAL CHOLECYSTECTOMY  ;  Surgeon: Gaynelle Adu, MD;  Location: [facility];  Service: General;  Laterality: N/A;  . TONSILLECTOMY      Family History  Problem Relation Age of Onset  . Esophageal cancer Mother   . Lung cancer Father   . Heart attack Father   . Heart attack Brother   . Lung cancer Sister     Allergies  Allergen Reactions  . Penicillin G Other (See Comments)  .  Penicillins     REACTION: rash    Current Outpatient Medications on File Prior to Visit  Medication Sig Dispense Refill  . albuterol (VENTOLIN HFA) 108 (90 Base) MCG/ACT inhaler TAKE 2 PUFFS BY MOUTH EVERY 6 HOURS AS NEEDED FOR WHEEZE OR SHORTNESS OF BREATH 8.5 each 5  . aspirin EC 81 MG tablet Take 81 mg by mouth daily. Swallow whole.    Marland Kitchen Fluticasone Furoate (ARNUITY ELLIPTA) 200 MCG/ACT AEPB TAKE 1 PUFF BY MOUTH EVERY DAY 30 each 3  . Fluticasone Propionate, Inhal, (FLOVENT DISKUS) 100 MCG/BLIST AEPB Inhale 1 puff into the lungs in the morning and at bedtime. 180 each 0  . haloperidol decanoate (HALDOL DECANOATE) 100 MG/ML injection Inject 100 mg into the muscle every 28 (twenty-eight) days.     No current facility-administered medications on file prior to visit.    BP 126/80   Pulse 61   Temp 97.9 F (36.6 C) (Temporal)   Resp 17   Ht 5\' 11"  (1.803 m)   Wt 169 lb 6 oz (76.8 kg)   SpO2 97%   BMI 23.62 kg/m chart      Objective:    BP 126/80   Pulse 61   Temp 97.9 F (36.6 C) (Temporal)   Resp 17   Ht 5\' 11"  (1.803 m)   Wt 169 lb 6 oz (76.8 kg)   SpO2 97%   BMI 23.62 kg/m    Physical Exam Vitals and nursing note reviewed.  Constitutional:      Appearance: Normal appearance.  Cardiovascular:     Rate and Rhythm: Normal rate and regular rhythm.     Pulses: Normal pulses.     Heart sounds: Normal heart sounds.  Pulmonary:     Effort: Pulmonary effort is normal.     Breath sounds: Normal breath sounds.  Abdominal:     General: Abdomen is flat.     Palpations: Abdomen is soft.  Musculoskeletal:        General: Normal range of motion.     Cervical back: Normal range of motion and neck supple.  Skin:    General: Skin is warm and dry.  Neurological:     General: No focal deficit present.     Mental Status: He is alert and oriented to person, place, and time.   No results found for any visits on 11/06/22.      Assessment & Plan:   Problem List Items  Addressed This Visit     HYPERTRIGLYCERIDEMIA - Primary   Other Visit Diagnoses     Smokes with greater than 40 pack year history         Blood pressure readings and  cholesterol do not require medication intervention at this time. Encouraged smoking cessation to optimize health. Will follow-up in 6 months. Encouraged him to come fasting and bring his home blood pressure cuff so we can compare readings. Office readings have been normal   No orders of the defined types were placed in this encounter.   Return in about 6 months (around 05/07/2023).  Eulis Foster, FNP

## 2023-02-18 ENCOUNTER — Other Ambulatory Visit: Payer: Self-pay | Admitting: Family

## 2023-02-18 DIAGNOSIS — J432 Centrilobular emphysema: Secondary | ICD-10-CM

## 2023-05-01 DIAGNOSIS — F209 Schizophrenia, unspecified: Secondary | ICD-10-CM | POA: Diagnosis not present

## 2023-06-12 ENCOUNTER — Encounter: Payer: Self-pay | Admitting: Family Medicine

## 2023-06-12 ENCOUNTER — Ambulatory Visit (INDEPENDENT_AMBULATORY_CARE_PROVIDER_SITE_OTHER): Payer: Medicare PPO | Admitting: Family Medicine

## 2023-06-12 VITALS — BP 108/68 | HR 82 | Temp 98.6°F | Ht 71.0 in | Wt 161.4 lb

## 2023-06-12 DIAGNOSIS — Z125 Encounter for screening for malignant neoplasm of prostate: Secondary | ICD-10-CM

## 2023-06-12 DIAGNOSIS — Z130 Encounter for screening for diseases of the blood and blood-forming organs and certain disorders involving the immune mechanism: Secondary | ICD-10-CM | POA: Diagnosis not present

## 2023-06-12 DIAGNOSIS — J432 Centrilobular emphysema: Secondary | ICD-10-CM | POA: Diagnosis not present

## 2023-06-12 DIAGNOSIS — Z7689 Persons encountering health services in other specified circumstances: Secondary | ICD-10-CM

## 2023-06-12 DIAGNOSIS — Z13228 Encounter for screening for other metabolic disorders: Secondary | ICD-10-CM | POA: Diagnosis not present

## 2023-06-12 DIAGNOSIS — E781 Pure hyperglyceridemia: Secondary | ICD-10-CM | POA: Diagnosis not present

## 2023-06-12 DIAGNOSIS — J439 Emphysema, unspecified: Secondary | ICD-10-CM

## 2023-06-12 MED ORDER — ALBUTEROL SULFATE HFA 108 (90 BASE) MCG/ACT IN AERS
INHALATION_SPRAY | RESPIRATORY_TRACT | 5 refills | Status: DC
Start: 2023-06-12 — End: 2023-10-01

## 2023-06-12 MED ORDER — TRELEGY ELLIPTA 100-62.5-25 MCG/ACT IN AEPB
1.0000 | INHALATION_SPRAY | Freq: Every day | RESPIRATORY_TRACT | 11 refills | Status: DC
Start: 2023-06-12 — End: 2023-12-24

## 2023-06-12 NOTE — Patient Instructions (Addendum)
-  It was a pleasure to meet you and look forward to take care of you.  -Ordered screening labs. Office will call with lab results.  -Continue taking the Arnuity Ellipta, but I have prescribed Trelegy to replace this inhaler. Please call the office tomorrow to let me know if you are able to get the new inhaler. -Refilled Albuterol Inhaler.  -Recommend to get your covid booster and influenza vaccine at your local pharmacy.  -Follow up in 3 months for a chronic management and needs a AWV appointment with Raynelle Fanning, LPN.

## 2023-06-12 NOTE — Progress Notes (Signed)
New Patient Office Visit  Subjective    Patient ID: Travis Lawrence, male    DOB: [birth year]  Age: 67 y.o. MRN: [id number]  CC:  Chief Complaint  Patient presents with   Establish Care    Pt is here today to Est Care. Pt would like refills on medication today.    HPI Travis Lawrence presents to establish care with new provider.   Patients previous primary care provider was Janeece Agee, NP. Last visit was 02/14/2021. Patient did see Worthy Rancher, FNP as a fill in after Kateri Plummer, NP in same office. Last visit was 11/21/2022.   Specialist: [facility] with [name], NP.  [facility] [place] with Arlana Hove, NP   Emphysema: Patient is prescribed Albuterol inhaler and Arnuity Ellipta inhaler. He reports he has been Albuterol Inhaler 2 puffs every 6 hours-he reports he gets a little short of breath, but probably don't need it that much. He reports he is taking Arnuity Ellipta inhaler.  Outpatient Encounter Medications as of 06/12/2023  Medication Sig   aspirin EC 81 MG tablet Take 81 mg by mouth daily. Swallow whole.   Fluticasone-Umeclidin-Vilant (TRELEGY ELLIPTA) 100-62.5-25 MCG/ACT AEPB Inhale 1 puff into the lungs daily.   haloperidol decanoate (HALDOL DECANOATE) 100 MG/ML injection Inject 100 mg into the muscle every 28 (twenty-eight) days.   [DISCONTINUED] albuterol (VENTOLIN HFA) 108 (90 Base) MCG/ACT inhaler TAKE 2 PUFFS BY MOUTH EVERY 6 HOURS AS NEEDED FOR WHEEZE OR SHORTNESS OF BREATH   [DISCONTINUED] Fluticasone Furoate (ARNUITY ELLIPTA) 200 MCG/ACT AEPB TAKE 1 PUFF BY MOUTH EVERY DAY   [DISCONTINUED] Fluticasone Propionate, Inhal, (FLOVENT DISKUS) 100 MCG/BLIST AEPB Inhale 1 puff into the lungs in the morning and at bedtime.   albuterol (VENTOLIN HFA) 108 (90 Base) MCG/ACT inhaler TAKE 2 PUFFS BY MOUTH EVERY 6 HOURS AS NEEDED FOR WHEEZE OR SHORTNESS OF BREATH   No facility-administered encounter medications on file as of 06/12/2023.    Past Medical History:   Diagnosis Date   Emphysema lung (HCC)    GERD (gastroesophageal reflux disease)    Hypertension    Hypertriglyceridemia    Schizophrenia (HCC)     Past Surgical History:  Procedure Laterality Date   CHOLECYSTECTOMY N/A 05/30/2015   Procedure: LAPAROSCOPIC SUBTOTAL CHOLECYSTECTOMY  ;  Surgeon: Gaynelle Adu, MD;  Location: [facility];  Service: General;  Laterality: N/A;   TONSILLECTOMY     WISDOM TOOTH EXTRACTION      Family History  Problem Relation Age of Onset   Esophageal cancer Mother    Lung cancer Father    Heart attack Father    Lung cancer Sister    COPD Sister    Heart attack Brother     Social History   Socioeconomic History   Marital status: Single    Spouse name: Not on file   Number of children: 0   Years of education: Not on file   Highest education level: Some college, no degree  Occupational History   Not on file  Tobacco Use   Smoking status: Every Day    Current packs/day: 1.00    Average packs/day: 1 pack/day for 41.0 years (41.0 ttl pk-yrs)    Types: Cigars, Cigarettes   Smokeless tobacco: Never  Vaping Use   Vaping status: Never Used  Substance and Sexual Activity   Alcohol use: No    Alcohol/week: 0.0 standard drinks of alcohol   Drug use: No   Sexual activity: Yes  Other Topics Concern  Not on file  Social History Narrative   Not on file   Social Determinants of Health   Financial Resource Strain: Low Risk  (07/24/2021)   Overall Financial Resource Strain (CARDIA)    Difficulty of Paying Living Expenses: Not hard at all  Food Insecurity: No Food Insecurity (06/12/2023)   Hunger Vital Sign    Worried About Running Out of Food in the Last Year: Never true    Ran Out of Food in the Last Year: Never true  Transportation Needs: No Transportation Needs (06/12/2023)   PRAPARE - Administrator, Civil Service (Medical): No    Lack of Transportation (Non-Medical): No  Physical Activity: Sufficiently Active (07/24/2021)   Exercise  Vital Sign    Days of Exercise per Week: 4 days    Minutes of Exercise per Session: 40 min  Stress: No Stress Concern Present (06/12/2023)   [employer] of Occupational Health - Occupational Stress Questionnaire    Feeling of Stress : Not at all  Social Connections: Moderately Isolated (06/12/2023)   Social Connection and Isolation Panel [NHANES]    Frequency of Communication with Friends and Family: More than three times a week    Frequency of Social Gatherings with Friends and Family: Once a week    Attends Religious Services: 1 to 4 times per year    Active Member of [employer] or Organizations: No    Attends Banker Meetings: Never    Marital Status: Never married  Intimate Partner Violence: Not At Risk (07/24/2021)   Humiliation, Afraid, Rape, and Kick questionnaire    Fear of Current or Ex-Partner: No    Emotionally Abused: No    Physically Abused: No    Sexually Abused: No    ROS See HPI above    Objective    BP 108/68   Pulse 82   Temp 98.6 F (37 C)   Ht 5\' 11"  (1.803 m)   Wt 161 lb 6 oz (73.2 kg)   SpO2 98%   BMI 22.51 kg/m   Physical Exam Vitals reviewed.  Constitutional:      General: He is not in acute distress.    Appearance: Normal appearance. He is not ill-appearing, toxic-appearing or diaphoretic.  HENT:     Head: Normocephalic and atraumatic.  Eyes:     General:        Right eye: No discharge.        Left eye: No discharge.     Conjunctiva/sclera: Conjunctivae normal.  Cardiovascular:     Rate and Rhythm: Normal rate and regular rhythm.     Heart sounds: Normal heart sounds. No murmur heard.    No friction rub. No gallop.  Pulmonary:     Effort: Pulmonary effort is normal. No respiratory distress.     Breath sounds: Wheezing (Mild- Posterior in all lung fields) present.  Musculoskeletal:        General: Normal range of motion.  Skin:    General: Skin is warm and dry.  Neurological:     General: No focal deficit present.      Mental Status: He is alert and oriented to person, place, and time. Mental status is at baseline.  Psychiatric:        Mood and Affect: Mood normal.        Behavior: Behavior normal.        Thought Content: Thought content normal.        Judgment: Judgment normal.  Assessment & Plan:  HYPERTRIGLYCERIDEMIA -     Lipid panel  Pulmonary emphysema, unspecified emphysema type (HCC) -     Trelegy Ellipta; Inhale 1 puff into the lungs daily.  Dispense: 1 each; Refill: 11  Screening for endocrine, metabolic and immunity disorder -     CBC with Differential/Platelet -     Comprehensive metabolic panel -     TSH  Encounter to establish care  Centrilobular emphysema (HCC) -     Albuterol Sulfate HFA; TAKE 2 PUFFS BY MOUTH EVERY 6 HOURS AS NEEDED FOR WHEEZE OR SHORTNESS OF BREATH  Dispense: 8.5 each; Refill: 5  Prostate cancer screening -     PSA   1.Review health maintenance: -Needs AWV-phone call with Raynelle Fanning, LPN -Influenza vaccine-will plan to get vaccine when it is available; will obtain at his local pharmacy  2.Zoster vaccine-request records from [facility] off of [place]  3.Ordered screening labs: lipid panel-last ate at noon, CBC, CMP, and TSH. Office will call with lab results.  4.Continue taking the Arnuity Ellipta, but prescribed Trelegy to replace this inhaler. Please call the office tomorrow to let this provider know if he are able to get the new inhaler. 5. Refilled Albuterol Inhaler. Hoping he can stop taking using Albuterol Inhaler so much with changing his maintenance inhaler.  Return in about 3 months (around 09/12/2023) for chronic management; AWV visit with Raynelle Fanning, LPN .   Zandra Abts, NP

## 2023-06-13 ENCOUNTER — Telehealth: Payer: Self-pay | Admitting: Family Medicine

## 2023-06-13 LAB — PSA: PSA: 0.35 ng/mL (ref 0.10–4.00)

## 2023-06-13 LAB — CBC WITH DIFFERENTIAL/PLATELET
Basophils Absolute: 0.1 10*3/uL (ref 0.0–0.1)
Basophils Relative: 0.9 % (ref 0.0–3.0)
Eosinophils Absolute: 0.1 10*3/uL (ref 0.0–0.7)
Eosinophils Relative: 1.9 % (ref 0.0–5.0)
HCT: 51.5 % (ref 39.0–52.0)
Hemoglobin: 17.3 g/dL — ABNORMAL HIGH (ref 13.0–17.0)
Lymphocytes Relative: 28.2 % (ref 12.0–46.0)
Lymphs Abs: 2.2 10*3/uL (ref 0.7–4.0)
MCHC: 33.5 g/dL (ref 30.0–36.0)
MCV: 97.8 fl (ref 78.0–100.0)
Monocytes Absolute: 0.5 10*3/uL (ref 0.1–1.0)
Monocytes Relative: 6.9 % (ref 3.0–12.0)
Neutro Abs: 4.9 10*3/uL (ref 1.4–7.7)
Neutrophils Relative %: 62.1 % (ref 43.0–77.0)
Platelets: 325 10*3/uL (ref 150.0–400.0)
RBC: 5.27 Mil/uL (ref 4.22–5.81)
RDW: 13.5 % (ref 11.5–15.5)
WBC: 7.8 10*3/uL (ref 4.0–10.5)

## 2023-06-13 LAB — COMPREHENSIVE METABOLIC PANEL
ALT: 7 U/L (ref 0–53)
AST: 10 U/L (ref 0–37)
Albumin: 4.3 g/dL (ref 3.5–5.2)
Alkaline Phosphatase: 100 U/L (ref 39–117)
BUN: 4 mg/dL — ABNORMAL LOW (ref 6–23)
CO2: 29 meq/L (ref 19–32)
Calcium: 9.7 mg/dL (ref 8.4–10.5)
Chloride: 94 meq/L — ABNORMAL LOW (ref 96–112)
Creatinine, Ser: 1.03 mg/dL (ref 0.40–1.50)
GFR: 75.07 mL/min (ref >60.00)
Glucose, Bld: 92 mg/dL (ref 70–99)
Potassium: 4.6 meq/L (ref 3.5–5.1)
Sodium: 131 meq/L — ABNORMAL LOW (ref 135–145)
Total Bilirubin: 0.8 mg/dL (ref 0.2–1.2)
Total Protein: 6.8 g/dL (ref 6.0–8.3)

## 2023-06-13 LAB — LIPID PANEL
Cholesterol: 201 mg/dL — ABNORMAL HIGH (ref 0–200)
HDL: 51.7 mg/dL (ref >39.00)
LDL Cholesterol: 126 mg/dL — ABNORMAL HIGH (ref 0–99)
NonHDL: 149.65
Total CHOL/HDL Ratio: 4
Triglycerides: 118 mg/dL (ref 0.0–149.0)
VLDL: 23.6 mg/dL (ref 0.0–40.0)

## 2023-06-13 LAB — TSH: TSH: 0.76 u[IU]/mL (ref 0.35–5.50)

## 2023-06-13 NOTE — Telephone Encounter (Signed)
Caller name: LATREL LUONGO  On DPR?: Yes  Call back number: [phone] (home)  Provider they see: Alveria Apley, NP  Reason for call:   Pt would like you know that he got both his meds.

## 2023-06-13 NOTE — Telephone Encounter (Signed)
He wanted to let you know he got his medications

## 2023-06-14 ENCOUNTER — Other Ambulatory Visit: Payer: Self-pay

## 2023-06-14 ENCOUNTER — Telehealth: Payer: Self-pay

## 2023-06-14 DIAGNOSIS — E871 Hypo-osmolality and hyponatremia: Secondary | ICD-10-CM

## 2023-06-14 DIAGNOSIS — E781 Pure hyperglyceridemia: Secondary | ICD-10-CM

## 2023-06-14 MED ORDER — ROSUVASTATIN CALCIUM 10 MG PO TABS
10.0000 mg | ORAL_TABLET | Freq: Every evening | ORAL | 0 refills | Status: DC
Start: 2023-06-14 — End: 2023-09-13

## 2023-06-14 NOTE — Telephone Encounter (Signed)
Pt has picked up Trelegy Inhaler. Pt has been notified of the note.

## 2023-06-14 NOTE — Telephone Encounter (Signed)
Called pt and unable to leave a vm. Sent Rosuvastatin 10mg 

## 2023-06-14 NOTE — Telephone Encounter (Signed)
Pt has lab only visit BMP place future ordered. Pt agrees to Cholesterol medication.

## 2023-06-14 NOTE — Telephone Encounter (Signed)
Called unable to leave vm 

## 2023-06-14 NOTE — Telephone Encounter (Signed)
-----   Message from Zandra Abts sent at 06/13/2023  4:23 PM EDT ----- Your total cholesterol and bad (LDL) cholesterol are elevated. Recommend diet changes and regular exercise. Diet encouraged - increase intake of fresh fruits and vegetables, increase intake of lean proteins. Bake, broil, or grill foods. Avoid fried, greasy, and fatty foods. Avoid fast foods. Increase intake of fiber-rich whole grains. Exercise encouraged - at least 150 minutes per week and advance as tolerated. The 10-year ASCVD risk score is: 14.4%, which is a high risk for having a cardiovascular event such as a heart attack or stroke in the next 10 years. Would like to go back on cholesterol medication?  Your sodium and chloride are low, recommend to recheck BMP in 2 weeks. (Lab: BMP Dx: Low sodium and low chloride) All other labs are stable. Were you able to get the Trelegy inhaler?

## 2023-06-17 NOTE — Telephone Encounter (Signed)
Pt reports he has pick up this medication and started.

## 2023-06-28 ENCOUNTER — Other Ambulatory Visit (INDEPENDENT_AMBULATORY_CARE_PROVIDER_SITE_OTHER): Payer: Medicare PPO

## 2023-06-28 DIAGNOSIS — E871 Hypo-osmolality and hyponatremia: Secondary | ICD-10-CM | POA: Diagnosis not present

## 2023-06-28 LAB — BASIC METABOLIC PANEL
BUN: 5 mg/dL — ABNORMAL LOW (ref 6–23)
CO2: 28 mEq/L (ref 19–32)
Calcium: 9.2 mg/dL (ref 8.4–10.5)
Chloride: 100 mEq/L (ref 96–112)
Creatinine, Ser: 1.01 mg/dL (ref 0.40–1.50)
GFR: 76.84 mL/min (ref >60.00)
Glucose, Bld: 170 mg/dL — ABNORMAL HIGH (ref 70–99)
Potassium: 4.4 mEq/L (ref 3.5–5.1)
Sodium: 136 mEq/L (ref 135–145)

## 2023-07-02 ENCOUNTER — Telehealth: Payer: Self-pay

## 2023-07-02 DIAGNOSIS — F209 Schizophrenia, unspecified: Secondary | ICD-10-CM | POA: Diagnosis not present

## 2023-07-02 NOTE — Telephone Encounter (Signed)
-----   Message from Zandra Abts sent at 07/02/2023  7:54 AM EDT ----- Sodium and chloride have improved back to normal limits. No concerns.

## 2023-07-10 ENCOUNTER — Ambulatory Visit (INDEPENDENT_AMBULATORY_CARE_PROVIDER_SITE_OTHER): Payer: Medicare PPO | Admitting: *Deleted

## 2023-07-10 DIAGNOSIS — Z0000 Encounter for general adult medical examination without abnormal findings: Secondary | ICD-10-CM

## 2023-07-10 DIAGNOSIS — Z1211 Encounter for screening for malignant neoplasm of colon: Secondary | ICD-10-CM

## 2023-07-10 NOTE — Progress Notes (Signed)
Subjective:   Travis Lawrence is a 67 y.o. male who presents for Medicare Annual/Subsequent preventive examination.  Visit Complete: Virtual  I connected with  Travis Lawrence on 07/10/23 by a audio enabled telemedicine application and verified that I am speaking with the correct person using two identifiers.  Patient Location: Home  Provider Location: Home Office  I discussed the limitations of evaluation and management by telemedicine. The patient expressed understanding and agreed to proceed.  Vital Signs: Unable to obtain new vitals due to this being a telehealth visit.   Review of Systems           Objective:    There were no vitals filed for this visit. There is no height or weight on file to calculate BMI.     07/10/2023    1:06 PM 07/24/2021   12:31 PM 05/27/2015    8:16 PM  Advanced Directives  Does Patient Have a Medical Advance Directive? No No No  Would patient like information on creating a medical advance directive? No - Patient declined No - Patient declined No - patient declined information    Current Medications (verified) Outpatient Encounter Medications as of 07/10/2023  Medication Sig   albuterol (VENTOLIN HFA) 108 (90 Base) MCG/ACT inhaler TAKE 2 PUFFS BY MOUTH EVERY 6 HOURS AS NEEDED FOR WHEEZE OR SHORTNESS OF BREATH   aspirin EC 81 MG tablet Take 81 mg by mouth daily. Swallow whole.   Fluticasone-Umeclidin-Vilant (TRELEGY ELLIPTA) 100-62.5-25 MCG/ACT AEPB Inhale 1 puff into the lungs daily.   haloperidol decanoate (HALDOL DECANOATE) 100 MG/ML injection Inject 100 mg into the muscle every 28 (twenty-eight) days.   rosuvastatin (CRESTOR) 10 MG tablet Take 1 tablet (10 mg total) by mouth at bedtime.   No facility-administered encounter medications on file as of 07/10/2023.    Allergies (verified) Penicillin g and Penicillins   History: Past Medical History:  Diagnosis Date   Emphysema lung (HCC)    GERD (gastroesophageal reflux disease)     Hypertension    Hypertriglyceridemia    Schizophrenia (HCC)    Past Surgical History:  Procedure Laterality Date   CHOLECYSTECTOMY N/A 05/30/2015   Procedure: LAPAROSCOPIC SUBTOTAL CHOLECYSTECTOMY  ;  Surgeon: Gaynelle Adu, MD;  Location: [facility];  Service: General;  Laterality: N/A;   TONSILLECTOMY     WISDOM TOOTH EXTRACTION     Family History  Problem Relation Age of Onset   Esophageal cancer Mother    Lung cancer Father    Heart attack Father    Lung cancer Sister    COPD Sister    Heart attack Brother    Social History   Socioeconomic History   Marital status: Single    Spouse name: Not on file   Number of children: 0   Years of education: Not on file   Highest education level: Some college, no degree  Occupational History   Not on file  Tobacco Use   Smoking status: Every Day    Current packs/day: 1.00    Average packs/day: 1 pack/day for 41.0 years (41.0 ttl pk-yrs)    Types: Cigars, Cigarettes   Smokeless tobacco: Never  Vaping Use   Vaping status: Never Used  Substance and Sexual Activity   Alcohol use: No    Alcohol/week: 0.0 standard drinks of alcohol   Drug use: No   Sexual activity: Yes  Other Topics Concern   Not on file  Social History Narrative   Not on file   Social Determinants  of Health   Financial Resource Strain: Low Risk  (07/10/2023)   Overall Financial Resource Strain (CARDIA)    Difficulty of Paying Living Expenses: Not hard at all  Food Insecurity: No Food Insecurity (07/10/2023)   Hunger Vital Sign    Worried About Running Out of Food in the Last Year: Never true    Ran Out of Food in the Last Year: Never true  Transportation Needs: No Transportation Needs (07/10/2023)   PRAPARE - Administrator, Civil Service (Medical): No    Lack of Transportation (Non-Medical): No  Physical Activity: Insufficiently Active (07/10/2023)   Exercise Vital Sign    Days of Exercise per Week: 3 days    Minutes of Exercise per Session: 30 min   Stress: No Stress Concern Present (07/10/2023)   [employer] of Occupational Health - Occupational Stress Questionnaire    Feeling of Stress : Not at all  Social Connections: Socially Isolated (07/10/2023)   Social Connection and Isolation Panel [NHANES]    Frequency of Communication with Friends and Family: Three times a week    Frequency of Social Gatherings with Friends and Family: Three times a week    Attends Religious Services: Never    Active Member of Clubs or Organizations: No    Attends Banker Meetings: Never    Marital Status: Never married    Tobacco Counseling Ready to quit: Not Answered Counseling given: Not Answered   Clinical Intake:  Pre-visit preparation completed: Yes  Pain : No/denies pain     Diabetes: No  How often do you need to have someone help you when you read instructions, pamphlets, or other written materials from your doctor or pharmacy?: 1 - Never  Interpreter Needed?: No  Information entered by :: Remi Haggard LPN   Activities of Daily Living     No data to display          Patient Care Team: Alveria Apley, NP as PCP - General (Family Medicine) Jeani Hawking, MD as Consulting Physician (Gastroenterology) Arlana Hove, NP as Nurse Practitioner (Psychiatry)  Indicate any recent Medical Services you may have received from other than [facility] providers in the past year (date may be approximate).     Assessment:   This is a routine wellness examination for Travis Lawrence.  Hearing/Vision screen Hearing Screening - Comments:: No trouble hearing Vision Screening - Comments:: Up to date [facility]   Goals Addressed             This Visit's Progress    Patient Stated       Quit smoking has cut down     Quit Smoking   Not on track      Depression Screen    07/10/2023    1:12 PM 06/12/2023    3:39 PM 11/06/2022   11:07 AM 09/25/2022   11:18 AM 07/24/2021   12:29 PM 12/21/2020    4:30 PM 09/11/2018   11:31 AM   PHQ 2/9 Scores  PHQ - 2 Score 0 0 1 1 0 0 0  PHQ- 9 Score 0 2 3 2        Fall Risk    07/10/2023    1:05 PM 06/12/2023    3:39 PM 11/06/2022   11:08 AM 09/25/2022   11:19 AM 07/24/2021   12:22 PM  Fall Risk   Falls in the past year? 1 0 0 0 0  Number falls in past yr: 0 0 0 0 0  Injury with  Fall? 0 0 0 0 0  Risk for fall due to :  No Fall Risks No Fall Risks No Fall Risks   Follow up Falls evaluation completed;Education provided;Falls prevention discussed Falls evaluation completed Falls evaluation completed Falls evaluation completed Falls evaluation completed;Falls prevention discussed    MEDICARE RISK AT HOME: Medicare Risk at Home Any stairs in or around the home?: Yes If so, are there any without handrails?: No Home free of loose throw rugs in walkways, pet beds, electrical cords, etc?: Yes Adequate lighting in your home to reduce risk of falls?: Yes Life alert?: No Use of a cane, walker or w/c?: No Grab bars in the bathroom?: Yes Shower chair or bench in shower?: Yes Elevated toilet seat or a handicapped toilet?: No  TIMED UP AND GO:  Was the test performed?  No    Cognitive Function:        07/10/2023    1:12 PM  6CIT Screen  What Year? 0 points  What month? 0 points  What time? 0 points  Count back from 20 0 points  Months in reverse 0 points  Repeat phrase 0 points  Total Score 0 points    Immunizations Immunization History  Administered Date(s) Administered   Influenza Whole 08/29/2006   Influenza,inj,Quad PF,6+ Mos 07/29/2018   Influenza-Unspecified 07/29/2018, 06/25/2022   PFIZER(Purple Top)SARS-COV-2 Vaccination 01/21/2020, 02/11/2020, 09/12/2020, 01/28/2021   Pneumococcal Polysaccharide-23 11/20/2015   Respiratory Syncytial Virus Vaccine,Recomb Aduvanted(Arexvy) 06/25/2022   Td 06/05/2006    TDAP status: Due, Education has been provided regarding the importance of this vaccine. Advised may receive this vaccine at local pharmacy or Health Dept.  Aware to provide a copy of the vaccination record if obtained from local pharmacy or Health Dept. Verbalized acceptance and understanding.  Flu Vaccine status: Due, Education has been provided regarding the importance of this vaccine. Advised may receive this vaccine at local pharmacy or Health Dept. Aware to provide a copy of the vaccination record if obtained from local pharmacy or Health Dept. Verbalized acceptance and understanding.  Pneumococcal vaccine status: Due, Education has been provided regarding the importance of this vaccine. Advised may receive this vaccine at local pharmacy or Health Dept. Aware to provide a copy of the vaccination record if obtained from local pharmacy or Health Dept. Verbalized acceptance and understanding.  Covid-19 vaccine status: Information provided on how to obtain vaccines.   Qualifies for Shingles Vaccine? Yes   Zostavax completed No   Shingrix Completed?: No.    Education has been provided regarding the importance of this vaccine. Patient has been advised to call insurance company to determine out of pocket expense if they have not yet received this vaccine. Advised may also receive vaccine at local pharmacy or Health Dept. Verbalized acceptance and understanding.  Screening Tests Health Maintenance  Topic Date Due   Pneumonia Vaccine 26+ Years old (2 of 2 - PCV) 09/26/2023 (Originally 11/19/2016)   Colonoscopy  09/26/2023 (Originally 10/03/2000)   Zoster Vaccines- Shingrix (1 of 2) 10/09/2023 (Originally 10/03/2005)   INFLUENZA VACCINE  01/27/2024 (Originally 05/30/2023)   Lung Cancer Screening  10/19/2023   Medicare Annual Wellness (AWV)  07/09/2024   Hepatitis C Screening  Completed   HPV VACCINES  Aged Out   DTaP/Tdap/Td  Discontinued   COVID-19 Vaccine  Discontinued    Health Maintenance  There are no preventive care reminders to display for this patient.   Colorectal cancer screening: Referral to GI placed  . Pt aware the office will call  re: appt.  Lung Cancer Screening: (Low Dose CT Chest recommended if Age 34-80 years, 20 pack-year currently smoking OR have quit w/in 15years.) does qualify.   Lung Cancer Screening Referral: scheduled for 10-21-2023  Additional Screening:  Hepatitis C Screening: does not qualify; Completed 2016  Vision Screening: Recommended annual ophthalmology exams for early detection of glaucoma and other disorders of the eye. Is the patient up to date with their annual eye exam?  Yes  Who is the provider or what is the name of the office in which the patient attends annual eye exams? [facility] If pt is not established with a provider, would they like to be referred to a provider to establish care? No .   Dental Screening: Recommended annual dental exams for proper oral hygiene    Community Resource Referral / Chronic Care Management: CRR required this visit?  No   CCM required this visit?  No     Plan:     I have personally reviewed and noted the following in the patient's chart:   Medical and social history Use of alcohol, tobacco or illicit drugs  Current medications and supplements including opioid prescriptions. Patient is not currently taking opioid prescriptions. Functional ability and status Nutritional status Physical activity Advanced directives List of other physicians Hospitalizations, surgeries, and ER visits in previous 12 months Vitals Screenings to include cognitive, depression, and falls Referrals and appointments  In addition, I have reviewed and discussed with patient certain preventive protocols, quality metrics, and best practice recommendations. A written personalized care plan for preventive services as well as general preventive health recommendations were provided to patient.     Remi Haggard, LPN   06/26/5620   After Visit Summary: (Declined) Due to this being a telephonic visit, with patients personalized plan was offered to patient but patient Declined AVS  at this time   Nurse Notes:

## 2023-07-10 NOTE — Patient Instructions (Signed)
Travis Lawrence , Thank you for taking time to come for your Medicare Wellness Visit. I appreciate your ongoing commitment to your health goals. Please review the following plan we discussed and let me know if I can assist you in the future.   Screening recommendations/referrals: Colonoscopy: Education provided Recommended yearly ophthalmology/optometry visit for glaucoma screening and checkup Recommended yearly dental visit for hygiene and checkup  Vaccinations: Influenza vaccine: education provided  Pneumococcal vaccine: Education provided Tdap vaccine: Education provided Shingles vaccine: Education provided     Preventive Care 65 Years and Older, Male Preventive care refers to lifestyle choices and visits with your health care provider that can promote health and wellness. What does preventive care include? A yearly physical exam. This is also called an annual well check. Dental exams once or twice a year. Routine eye exams. Ask your health care provider how often you should have your eyes checked. Personal lifestyle choices, including: Daily care of your teeth and gums. Regular physical activity. Eating a healthy diet. Avoiding tobacco and drug use. Limiting alcohol use. Practicing safe sex. Taking low doses of aspirin every day. Taking vitamin and mineral supplements as recommended by your health care provider. What happens during an annual well check? The services and screenings done by your health care provider during your annual well check will depend on your age, overall health, lifestyle risk factors, and family history of disease. Counseling  Your health care provider may ask you questions about your: Alcohol use. Tobacco use. Drug use. Emotional well-being. Home and relationship well-being. Sexual activity. Eating habits. History of falls. Memory and ability to understand (cognition). Work and work Astronomer. Screening  You may have the following tests or  measurements: Height, weight, and BMI. Blood pressure. Lipid and cholesterol levels. These may be checked every 5 years, or more frequently if you are over 65 years old. Skin check. Lung cancer screening. You may have this screening every year starting at age 93 if you have a 30-pack-year history of smoking and currently smoke or have quit within the past 15 years. Fecal occult blood test (FOBT) of the stool. You may have this test every year starting at age 15. Flexible sigmoidoscopy or colonoscopy. You may have a sigmoidoscopy every 5 years or a colonoscopy every 10 years starting at age 53. Prostate cancer screening. Recommendations will vary depending on your family history and other risks. Hepatitis C blood test. Hepatitis B blood test. Sexually transmitted disease (STD) testing. Diabetes screening. This is done by checking your blood sugar (glucose) after you have not eaten for a while (fasting). You may have this done every 1-3 years. Abdominal aortic aneurysm (AAA) screening. You may need this if you are a current or former smoker. Osteoporosis. You may be screened starting at age 65 if you are at high risk. Talk with your health care provider about your test results, treatment options, and if necessary, the need for more tests. Vaccines  Your health care provider may recommend certain vaccines, such as: Influenza vaccine. This is recommended every year. Tetanus, diphtheria, and acellular pertussis (Tdap, Td) vaccine. You may need a Td booster every 10 years. Zoster vaccine. You may need this after age 29. Pneumococcal 13-valent conjugate (PCV13) vaccine. One dose is recommended after age 57. Pneumococcal polysaccharide (PPSV23) vaccine. One dose is recommended after age 13. Talk to your health care provider about which screenings and vaccines you need and how often you need them. This information is not intended to replace advice given to  you by your health care provider. Make sure  you discuss any questions you have with your health care provider. Document Released: 11/11/2015 Document Revised: 07/04/2016 Document Reviewed: 08/16/2015 Elsevier Interactive Patient Education  2017 [employer].  Fall Prevention in the Home Falls can cause injuries. They can happen to people of all ages. There are many things you can do to make your home safe and to help prevent falls. What can I do on the outside of my home? Regularly fix the edges of walkways and driveways and fix any cracks. Remove anything that might make you trip as you walk through a door, such as a raised step or threshold. Trim any bushes or trees on the path to your home. Use bright outdoor lighting. Clear any walking paths of anything that might make someone trip, such as rocks or tools. Regularly check to see if handrails are loose or broken. Make sure that both sides of any steps have handrails. Any raised decks and porches should have guardrails on the edges. Have any leaves, snow, or ice cleared regularly. Use sand or salt on walking paths during winter. Clean up any spills in your garage right away. This includes oil or grease spills. What can I do in the bathroom? Use night lights. Install grab bars by the toilet and in the tub and shower. Do not use towel bars as grab bars. Use non-skid mats or decals in the tub or shower. If you need to sit down in the shower, use a plastic, non-slip stool. Keep the floor dry. Clean up any water that spills on the floor as soon as it happens. Remove soap buildup in the tub or shower regularly. Attach bath mats securely with double-sided non-slip rug tape. Do not have throw rugs and other things on the floor that can make you trip. What can I do in the bedroom? Use night lights. Make sure that you have a light by your bed that is easy to reach. Do not use any sheets or blankets that are too big for your bed. They should not hang down onto the floor. Have a firm  chair that has side arms. You can use this for support while you get dressed. Do not have throw rugs and other things on the floor that can make you trip. What can I do in the kitchen? Clean up any spills right away. Avoid walking on wet floors. Keep items that you use a lot in easy-to-reach places. If you need to reach something above you, use a strong step stool that has a grab bar. Keep electrical cords out of the way. Do not use floor polish or wax that makes floors slippery. If you must use wax, use non-skid floor wax. Do not have throw rugs and other things on the floor that can make you trip. What can I do with my stairs? Do not leave any items on the stairs. Make sure that there are handrails on both sides of the stairs and use them. Fix handrails that are broken or loose. Make sure that handrails are as long as the stairways. Check any carpeting to make sure that it is firmly attached to the stairs. Fix any carpet that is loose or worn. Avoid having throw rugs at the top or bottom of the stairs. If you do have throw rugs, attach them to the floor with carpet tape. Make sure that you have a light switch at the top of the stairs and the bottom of the stairs.  If you do not have them, ask someone to add them for you. What else can I do to help prevent falls? Wear shoes that: Do not have high heels. Have rubber bottoms. Are comfortable and fit you well. Are closed at the toe. Do not wear sandals. If you use a stepladder: Make sure that it is fully opened. Do not climb a closed stepladder. Make sure that both sides of the stepladder are locked into place. Ask someone to hold it for you, if possible. Clearly mark and make sure that you can see: Any grab bars or handrails. First and last steps. Where the edge of each step is. Use tools that help you move around (mobility aids) if they are needed. These include: Canes. Walkers. Scooters. Crutches. Turn on the lights when you go  into a dark area. Replace any light bulbs as soon as they burn out. Set up your furniture so you have a clear path. Avoid moving your furniture around. If any of your floors are uneven, fix them. If there are any pets around you, be aware of where they are. Review your medicines with your doctor. Some medicines can make you feel dizzy. This can increase your chance of falling. Ask your doctor what other things that you can do to help prevent falls. This information is not intended to replace advice given to you by your health care provider. Make sure you discuss any questions you have with your health care provider. Document Released: 08/11/2009 Document Revised: 03/22/2016 Document Reviewed: 11/19/2014 Elsevier Interactive Patient Education  2017 [employer].

## 2023-07-11 ENCOUNTER — Encounter: Payer: Self-pay | Admitting: Family Medicine

## 2023-08-20 DIAGNOSIS — F209 Schizophrenia, unspecified: Secondary | ICD-10-CM | POA: Diagnosis not present

## 2023-09-06 ENCOUNTER — Ambulatory Visit: Payer: No Typology Code available for payment source | Admitting: Family Medicine

## 2023-09-06 NOTE — Progress Notes (Deleted)
   Established Patient Office Visit   Subjective:  Patient ID: Travis Lawrence, male    DOB: 1956/08/29  Age: 67 y.o. MRN: [id number]  No chief complaint on file.   HPI Hyperlipidemia: Chronic. Since last visit, patient has started taking Rosuvastatin 10mg  daily for a ASCVD risk score of 14.4%.   Emphysema: Chronic. Patient is taking Trelegy Inhaler, 1 puff day, and Albuterol Inhaler as needed. At previous appointment, changed Arnuity Ellipta Inhaler to Trelegy.   ROS See HPI above     Objective:     There were no vitals taken for this visit. {Vitals History (Optional):23777}  Physical Exam  No results found for any visits on 09/06/23.  The 10-year ASCVD risk score (Arnett DK, et al., 2019) is: 14.4%    Assessment & Plan:  There are no diagnoses linked to this encounter. 1.Review health maintenance: -Zoster vaccine-request records from [facility] off of [address]  -PNA vaccine:  -Colonoscopy:  No follow-ups on file.   Zandra Abts, NP

## 2023-09-12 DIAGNOSIS — H353132 Nonexudative age-related macular degeneration, bilateral, intermediate dry stage: Secondary | ICD-10-CM | POA: Diagnosis not present

## 2023-09-12 DIAGNOSIS — Z0100 Encounter for examination of eyes and vision without abnormal findings: Secondary | ICD-10-CM | POA: Diagnosis not present

## 2023-09-13 ENCOUNTER — Encounter: Payer: Self-pay | Admitting: Family Medicine

## 2023-09-13 ENCOUNTER — Ambulatory Visit (INDEPENDENT_AMBULATORY_CARE_PROVIDER_SITE_OTHER): Payer: No Typology Code available for payment source | Admitting: Family Medicine

## 2023-09-13 VITALS — BP 130/96 | HR 73 | Temp 97.9°F | Ht 71.0 in | Wt 156.2 lb

## 2023-09-13 DIAGNOSIS — I10 Essential (primary) hypertension: Secondary | ICD-10-CM | POA: Diagnosis not present

## 2023-09-13 DIAGNOSIS — H3530 Unspecified macular degeneration: Secondary | ICD-10-CM | POA: Diagnosis not present

## 2023-09-13 DIAGNOSIS — E781 Pure hyperglyceridemia: Secondary | ICD-10-CM

## 2023-09-13 DIAGNOSIS — J439 Emphysema, unspecified: Secondary | ICD-10-CM

## 2023-09-13 MED ORDER — ROSUVASTATIN CALCIUM 10 MG PO TABS: 10.0000 mg | ORAL_TABLET | Freq: Every evening | ORAL | 0 refills | Status: DC

## 2023-09-13 MED ORDER — PRESERVISION AREDS 2 PO CAPS: 1.0000 | ORAL_CAPSULE | Freq: Two times a day (BID) | ORAL | 0 refills | Status: DC

## 2023-09-13 NOTE — Progress Notes (Signed)
Established Patient Office Visit   Subjective:  Patient ID: Travis Lawrence, male    DOB: [birth year]  Age: 67 y.o. MRN: [id number]  Chief Complaint  Patient presents with   Medical Management of Chronic Issues    HPI Emphysema: Chronic. Patient is taking Trelegy Inhaler daily and using Albuterol Inhaler as needed. He reports has seen an improvement with taking Trelegy and not using Albuterol Inhaler as much.   Hyperlipidemia: Chronic. Patient is taking Rosuvastatin 10mg  daily. Denies any muscle or joint pain; denies any abd pain or nausea/vomiting.   Lab Results  Component Value Date   CHOL 201 (H) 06/12/2023   HDL 51.70 06/12/2023   LDLCALC 126 (H) 06/12/2023   TRIG 118.0 06/12/2023   CHOLHDL 4 06/12/2023    Patient reports he was seen by Dr. London Sheer with [facility] yesterday. He reports he was told he had macular degeneration in both eyes and requesting a prescription for Preservation Areds 1 capsule twice a day to see if insurance will cover.  ROS See HPI above     Objective:   BP (!) 130/96 (BP Location: Left Arm, Cuff Size: Normal)   Pulse 73   Temp 97.9 F (36.6 C) (Oral)   Ht 5\' 11"  (1.803 m)   Wt 156 lb 3.2 oz (70.9 kg)   SpO2 95%   BMI 21.79 kg/m  BP Readings from Last 3 Encounters:  09/13/23 (!) 130/96  06/12/23 108/68  11/06/22 126/80      Physical Exam Vitals reviewed.  Constitutional:      General: He is not in acute distress.    Appearance: Normal appearance. He is not ill-appearing, toxic-appearing or diaphoretic.  HENT:     Head: Normocephalic and atraumatic.  Eyes:     General:        Right eye: No discharge.        Left eye: No discharge.     Conjunctiva/sclera: Conjunctivae normal.  Cardiovascular:     Rate and Rhythm: Normal rate and regular rhythm.     Heart sounds: Normal heart sounds. No murmur heard.    No friction rub. No gallop.  Pulmonary:     Effort: Pulmonary effort is normal. No respiratory distress.      Breath sounds: Normal breath sounds.  Musculoskeletal:        General: Normal range of motion.  Skin:    General: Skin is warm and dry.  Neurological:     General: No focal deficit present.     Mental Status: He is alert and oriented to person, place, and time. Mental status is at baseline.  Psychiatric:        Mood and Affect: Mood normal.        Behavior: Behavior normal.        Thought Content: Thought content normal.        Judgment: Judgment normal.      Assessment & Plan:  HYPERTRIGLYCERIDEMIA Assessment & Plan: Stable. Continue Rosuvastatin 10mg  daily. Ordered lipid and CMP. Patient is not fasting and will make a lab appointment to be fasting.   Orders: -     Comprehensive metabolic panel; Future -     Lipid panel; Future -     Rosuvastatin Calcium; Take 1 tablet (10 mg total) by mouth at bedtime.  Dispense: 90 tablet; Refill: 0  Pulmonary emphysema, unspecified emphysema type (HCC) Assessment & Plan: Stable. Continue with Trelegy Inhaler and Albuterol Inhaler as needed.    Macular  degeneration of both eyes, unspecified type -     PreserVision AREDS 2; Take 1 capsule by mouth 2 (two) times daily for 90 doses.  Dispense: 90 capsule; Refill: 0  Essential hypertension, benign  1.Review health maintenance:  -CT Lung Screening: Appointment is for 12/23. -Provided contact information to have colonoscopy/cologuard with Dr. Jeani Hawking with [facility].  -PNA vaccine: [facility]bt[facility] at [facility] -Zoster vaccine: Obtain at [facility]  2.Request records from ophthalmology and sent in prescription for Preservision to see if insurance will cover the cost.  3.Blood pressure is elevated. He is not taking any medication. Recommend patient to monitor his blood pressure BID for 2 weeks and have a follow up on his blood pressure. (This was told to patient after AVS was printed) Return in about 6 months (around 03/12/2024) for physical: Lab visit when fasting.2 weeks blood pressure.    Zandra Abts, NP

## 2023-09-13 NOTE — Assessment & Plan Note (Signed)
Stable. Continue Rosuvastatin 10mg  daily. Ordered lipid and CMP. Patient is not fasting and will make a lab appointment to be fasting.

## 2023-09-13 NOTE — Patient Instructions (Addendum)
-  Contact Dr. Jeani Hawking with [facility], [place] about colonoscopy or cologuard [address] [place], [place] [address] [phone] -Recommend to obtain vaccines, pneumonia and zoster, at local pharmacy [facility]. -Continue with medications. Refilled Rosuvastatin. -Follow up in 6 months for a physical.  -Need lab visit for when you are fasting.

## 2023-09-13 NOTE — Assessment & Plan Note (Signed)
Stable. Continue with Trelegy Inhaler and Albuterol Inhaler as needed.

## 2023-09-24 DIAGNOSIS — F209 Schizophrenia, unspecified: Secondary | ICD-10-CM | POA: Diagnosis not present

## 2023-10-01 ENCOUNTER — Ambulatory Visit (INDEPENDENT_AMBULATORY_CARE_PROVIDER_SITE_OTHER): Payer: No Typology Code available for payment source | Admitting: Family Medicine

## 2023-10-01 ENCOUNTER — Ambulatory Visit: Payer: No Typology Code available for payment source | Admitting: Family Medicine

## 2023-10-01 ENCOUNTER — Encounter: Payer: Self-pay | Admitting: Family Medicine

## 2023-10-01 VITALS — BP 122/80 | HR 67 | Temp 97.9°F | Ht 71.0 in | Wt 156.0 lb

## 2023-10-01 DIAGNOSIS — J432 Centrilobular emphysema: Secondary | ICD-10-CM | POA: Diagnosis not present

## 2023-10-01 DIAGNOSIS — R030 Elevated blood-pressure reading, without diagnosis of hypertension: Secondary | ICD-10-CM | POA: Diagnosis not present

## 2023-10-01 DIAGNOSIS — E781 Pure hyperglyceridemia: Secondary | ICD-10-CM | POA: Diagnosis not present

## 2023-10-01 MED ORDER — ALBUTEROL SULFATE HFA 108 (90 BASE) MCG/ACT IN AERS: INHALATION_SPRAY | RESPIRATORY_TRACT | 5 refills | Status: DC

## 2023-10-01 NOTE — Patient Instructions (Signed)
BP looks good--122/80.    No need for additional medications at this time

## 2023-10-01 NOTE — Progress Notes (Signed)
Established Patient Office Visit  Subjective   Patient ID: Travis Lawrence, male    DOB: 05-Jun-1956  Age: 67 y.o. MRN: [id number]  Chief Complaint  Patient presents with   Medical Management of Chronic Issues    HPI   Travis Lawrence is seen today for follow-up lab work.  He takes rosuvastatin 10 mg daily.  He states he has been fasting all day and needs lipid and CMP.  Orders have already been placed.  Tolerating Crestor without difficulty.  Was seen by primary couple weeks ago and had mildly elevated blood pressure.  No history of hypertension.  No recent headaches or dizziness.  Does not monitor blood pressures at home.  Longstanding history of nicotine use.  He has emphysema and is on Trelegy.  Requesting refill of albuterol.  Flu vaccine already given.  Past Medical History:  Diagnosis Date   Emphysema lung (HCC)    GERD (gastroesophageal reflux disease)    Hypertension    Hypertriglyceridemia    Schizophrenia (HCC)    Past Surgical History:  Procedure Laterality Date   CHOLECYSTECTOMY N/A 05/30/2015   Procedure: LAPAROSCOPIC SUBTOTAL CHOLECYSTECTOMY  ;  Surgeon: Gaynelle Adu, MD;  Location: [facility];  Service: General;  Laterality: N/A;   TONSILLECTOMY     WISDOM TOOTH EXTRACTION      reports that he has been smoking cigars and cigarettes. He has a 41 pack-year smoking history. He has never used smokeless tobacco. He reports that he does not drink alcohol and does not use drugs. family history includes COPD in his sister; Esophageal cancer in his mother; Heart attack in his brother and father; Lung cancer in his father and sister. Allergies  Allergen Reactions   Penicillin G Other (See Comments)   Penicillins Hives    Review of Systems  Constitutional:  Negative for malaise/fatigue.  Eyes:  Negative for blurred vision.  Respiratory:  Negative for hemoptysis.   Cardiovascular:  Negative for chest pain.  Neurological:  Negative for dizziness, weakness and headaches.       Objective:     BP 122/80 (BP Location: Left Arm, Cuff Size: Normal)   Pulse 67   Temp 97.9 F (36.6 C) (Oral)   Ht 5\' 11"  (1.803 m)   Wt 156 lb (70.8 kg)   SpO2 96%   BMI 21.76 kg/m  BP Readings from Last 3 Encounters:  10/01/23 122/80  09/13/23 (!) 130/96  06/12/23 108/68   Wt Readings from Last 3 Encounters:  10/01/23 156 lb (70.8 kg)  09/13/23 156 lb 3.2 oz (70.9 kg)  06/12/23 161 lb 6 oz (73.2 kg)      Physical Exam Constitutional:      General: He is not in acute distress.    Appearance: He is well-developed.  Eyes:     Pupils: Pupils are equal, round, and reactive to light.  Neck:     Thyroid: No thyromegaly.  Cardiovascular:     Rate and Rhythm: Normal rate and regular rhythm.  Pulmonary:     Effort: Pulmonary effort is normal.     Comments: Somewhat diminished breath sounds throughout Musculoskeletal:     Cervical back: Neck supple.  Neurological:     Mental Status: He is alert and oriented to person, place, and time.      No results found for any visits on 10/01/23.    The 10-year ASCVD risk score (Arnett DK, et al., 2019) is: 17.5%    Assessment & Plan:   #1 recent  elevated blood pressure without diagnosis of hypertension.  Repeat today left arm seated after rest 122/80.  Recommend ongoing observation and no initiation of medication at this time.  Watch sodium intake.  #2 hyperlipidemia treated with rosuvastatin 10 mg daily.  Check lipid and CMP today.    #3 history of emphysema.  Ongoing nicotine use.  Discussed smoking cessation.  He is currently down to 10 cigarettes/day.  Flu vaccine already given.  Continue Trelegy.  Refill albuterol for as needed use.  He will confirm if pneumonia vaccines are up-to-date   No follow-ups on file.    Evelena Peat, MD

## 2023-10-02 LAB — COMPREHENSIVE METABOLIC PANEL
ALT: 9 U/L (ref 0–53)
AST: 13 U/L (ref 0–37)
Albumin: 4.1 g/dL (ref 3.5–5.2)
Alkaline Phosphatase: 104 U/L (ref 39–117)
BUN: 5 mg/dL — ABNORMAL LOW (ref 6–23)
CO2: 30 meq/L (ref 19–32)
Calcium: 9.2 mg/dL (ref 8.4–10.5)
Chloride: 98 meq/L (ref 96–112)
Creatinine, Ser: 1.07 mg/dL (ref 0.40–1.50)
GFR: 71.56 mL/min (ref >60.00)
Glucose, Bld: 92 mg/dL (ref 70–99)
Potassium: 4.3 meq/L (ref 3.5–5.1)
Sodium: 135 meq/L (ref 135–145)
Total Bilirubin: 0.8 mg/dL (ref 0.2–1.2)
Total Protein: 7 g/dL (ref 6.0–8.3)

## 2023-10-02 LAB — LIPID PANEL
Cholesterol: 130 mg/dL (ref 0–200)
HDL: 49.2 mg/dL (ref >39.00)
LDL Cholesterol: 65 mg/dL (ref 0–99)
NonHDL: 80.93
Total CHOL/HDL Ratio: 3
Triglycerides: 82 mg/dL (ref 0.0–149.0)
VLDL: 16.4 mg/dL (ref 0.0–40.0)

## 2023-10-21 ENCOUNTER — Ambulatory Visit (HOSPITAL_BASED_OUTPATIENT_CLINIC_OR_DEPARTMENT_OTHER)
Admission: RE | Admit: 2023-10-21 | Discharge: 2023-10-21 | Disposition: A | Discharge status: Home / Self Care | Payer: No Typology Code available for payment source | Source: Ambulatory Visit | Attending: Acute Care | Admitting: Acute Care

## 2023-10-21 DIAGNOSIS — R918 Other nonspecific abnormal finding of lung field: Secondary | ICD-10-CM | POA: Insufficient documentation

## 2023-10-21 DIAGNOSIS — I2510 Atherosclerotic heart disease of native coronary artery without angina pectoris: Secondary | ICD-10-CM | POA: Diagnosis not present

## 2023-10-21 DIAGNOSIS — J439 Emphysema, unspecified: Secondary | ICD-10-CM | POA: Diagnosis not present

## 2023-10-21 DIAGNOSIS — I700 Atherosclerosis of aorta: Secondary | ICD-10-CM | POA: Diagnosis not present

## 2023-10-21 DIAGNOSIS — Z122 Encounter for screening for malignant neoplasm of respiratory organs: Secondary | ICD-10-CM | POA: Diagnosis not present

## 2023-10-21 DIAGNOSIS — F17210 Nicotine dependence, cigarettes, uncomplicated: Secondary | ICD-10-CM | POA: Insufficient documentation

## 2023-10-21 DIAGNOSIS — Z87891 Personal history of nicotine dependence: Secondary | ICD-10-CM | POA: Insufficient documentation

## 2023-11-01 ENCOUNTER — Other Ambulatory Visit: Payer: Self-pay | Admitting: Family Medicine

## 2023-11-01 DIAGNOSIS — H3530 Unspecified macular degeneration: Secondary | ICD-10-CM

## 2023-11-06 ENCOUNTER — Telehealth: Payer: Self-pay | Admitting: Acute Care

## 2023-11-06 NOTE — Telephone Encounter (Signed)
 NFN

## 2023-11-06 NOTE — Telephone Encounter (Signed)
 Call Report, spoke with [facility]   1. Lung-RADS 4B, suspicious. Additional imaging evaluation or consultation with Pulmonology or Thoracic Surgery recommended. Several new bilateral pulmonary nodules, the largest and most suspicious of which is an irregular bilobed solid right upper lobe nodule measuring 17.7 mm in volume derived mean diameter. Suggest PET-CT for further characterization at this time. 2. Three-vessel coronary atherosclerosis. 3. Aortic Atherosclerosis (ICD10-I70.0) and Emphysema (ICD10-J43.9).

## 2023-11-06 NOTE — Telephone Encounter (Signed)
 [facility] calling w/STAT call report

## 2023-11-07 ENCOUNTER — Telehealth: Payer: Self-pay | Admitting: Acute Care

## 2023-11-07 NOTE — Telephone Encounter (Signed)
 I have attempted to call the patient with the results of his Low Dose CT Chest. There was no answer. There was no option to leave a message. We will try again tomorrow. The patient needs a PET scan and follow up I the [facility] office within a week of the scan being completed.

## 2023-11-12 DIAGNOSIS — F200 Paranoid schizophrenia: Secondary | ICD-10-CM | POA: Diagnosis not present

## 2023-11-13 ENCOUNTER — Telehealth: Payer: Self-pay | Admitting: Acute Care

## 2023-11-13 DIAGNOSIS — R918 Other nonspecific abnormal finding of lung field: Secondary | ICD-10-CM

## 2023-11-13 DIAGNOSIS — Z1211 Encounter for screening for malignant neoplasm of colon: Secondary | ICD-10-CM | POA: Diagnosis not present

## 2023-11-13 NOTE — Telephone Encounter (Signed)
 I have called the patient with the results of his CT Chest. There was a new  irregular bilobed solid right upper lobe nodule measuring 17.7 mm . We need to schedule a PET scan to better evaluate this finding. Pt. Is in  agreement with this plan.  I will order PET scan. Please fax results to PCP and let them know the plan is for a PET scan and follow up in the Pulmonary office. Please keep a look out for the date of the PET scan, then schedule with me or Dr. Baldwin Levee within a week of the scan completion  for follow up in the office to review results. Thanks so much

## 2023-11-13 NOTE — Telephone Encounter (Signed)
 Pt. Returned our call. He hung up before I caould get the call. I have called back and his phone goes right to VM, so I don't thik he hears it ring..I have left another message for him to call the office back

## 2023-11-13 NOTE — Telephone Encounter (Signed)
 I have attempted to call the patient with the results of their  Low Dose CT Chest Lung cancer screening scan. There was no answer. I have left a HIPPA compliant VM requesting the patient call the office for the scan results. I included the office contact information in the message. We will await his return call. If no return call we will continue to call until patient is contacted.    This is the second attempt to get in touch with the patient . He has not responded to our first call, and message.  As the scan is read as a 4B, and the patient has a new  irregular bilobed solid right upper lobe nodule measuring 17.7 mm. He needs a PET scan.  I called his sister, Elma Hacker who is his emergency contact.She is going to call him and ask him to return our call. Ladies, if he calls please let me know. Thanks so much

## 2023-11-14 NOTE — Telephone Encounter (Signed)
PET scheduled for 11/26/2023 and f/u with Alexandria Lodge, NP 12/03/2023

## 2023-11-14 NOTE — Telephone Encounter (Signed)
See other telephone note from 11/13/2023

## 2023-11-14 NOTE — Telephone Encounter (Signed)
Results/ plans faxed to PCP.  

## 2023-11-25 ENCOUNTER — Telehealth: Payer: Self-pay

## 2023-11-25 NOTE — Telephone Encounter (Signed)
Copied from CRM [id number]. Topic: General - Other >> Nov 25, 2023  9:42 AM Kathryne Eriksson wrote: Reason for CRM: Excuse From Jury Duty >> Nov 25, 2023  9:46 AM Kathryne Eriksson wrote: Patient called in asking for medical release from jury duty.

## 2023-11-26 ENCOUNTER — Ambulatory Visit (HOSPITAL_COMMUNITY)
Admission: RE | Admit: 2023-11-26 | Discharge: 2023-11-26 | Disposition: A | Discharge status: Home / Self Care | Payer: Medicare HMO | Source: Ambulatory Visit | Attending: Acute Care | Admitting: Acute Care

## 2023-11-26 DIAGNOSIS — R918 Other nonspecific abnormal finding of lung field: Secondary | ICD-10-CM | POA: Diagnosis not present

## 2023-11-26 LAB — GLUCOSE, CAPILLARY: Glucose-Capillary: 110 mg/dL — ABNORMAL HIGH (ref 70–99)

## 2023-11-26 MED ORDER — FLUDEOXYGLUCOSE F - 18 (FDG) INJECTION
7.8000 | Freq: Once | INTRAVENOUS | Status: AC
  Administered 2023-11-26: 7.71 via INTRAVENOUS

## 2023-11-26 NOTE — Telephone Encounter (Signed)
Pt called back checking on the status of jury duty letter

## 2023-11-26 NOTE — Telephone Encounter (Signed)
Pt missed jury duty is on  11-19-2023 and he is aware can take up to 7 business day to get letter. Pt  Juror number is [phone]

## 2023-11-27 NOTE — Telephone Encounter (Signed)
Letter was composed and printed.  Contacted pt and inform pt that his letter is ready for a pick up. Pt verbalized understanding and states he will come by today.

## 2023-12-03 ENCOUNTER — Telehealth: Payer: Self-pay

## 2023-12-03 ENCOUNTER — Ambulatory Visit: Payer: Medicare HMO | Admitting: Acute Care

## 2023-12-03 ENCOUNTER — Encounter: Payer: Self-pay | Admitting: Emergency Medicine

## 2023-12-03 ENCOUNTER — Encounter: Payer: Self-pay | Admitting: Acute Care

## 2023-12-03 VITALS — BP 114/68 | HR 76 | Ht 71.0 in | Wt 153.8 lb

## 2023-12-03 DIAGNOSIS — F17200 Nicotine dependence, unspecified, uncomplicated: Secondary | ICD-10-CM

## 2023-12-03 DIAGNOSIS — F17210 Nicotine dependence, cigarettes, uncomplicated: Secondary | ICD-10-CM

## 2023-12-03 DIAGNOSIS — R911 Solitary pulmonary nodule: Secondary | ICD-10-CM

## 2023-12-03 MED ORDER — TRELEGY ELLIPTA 100-62.5-25 MCG/ACT IN AEPB: 1.0000 | INHALATION_SPRAY | Freq: Every day | RESPIRATORY_TRACT | Status: DC

## 2023-12-03 NOTE — H&P (View-Only) (Signed)
 History of Present Illness Travis Lawrence is a 68 y.o. male current every day ( 41 pack year smoking history) smoker followed through the screening program. Here for evaluation of an abnormal screening scan .  Synopsis Pt had a LDCT 10/20/2024 which was read as a 4 B. There was a   a new  irregular bilobed solid right upper lobe nodule measuring 17.7 mm. PET imaging was done, and patient is here today to review the results.  12/03/2023 Pt. Presents for follow up after PET scan to better evaluate  a new  irregular bilobed solid right upper lobe nodule measuring 17.7 mm  discovered on Lung Cancer screening. Pt. Is continuing to smoke about 1-2 cigarettes daily. He states he has been at his baseline. He is compliant with his Trelegy. We have provided him with Trelegy samples, as this is really helping with his breathing. He does use albuterol  at least daily.  We have discussed the results of his PET scan.  PET scan shows the 2.8 x 0.8 cm right upper lobe nodule has a maximum SUV uptake of 4.3.  This is suspicious for malignancy.  The 7 x 6 mm subpleural nodule did not have substantial metabolic activity however it is below the limits of PET sensitivity. We discussed that to best provide care neck step will be a biopsy of the right upper lobe nodule to evaluate it for a malignancy.  Patient is in agreement with this plan.  We have reviewed the risks and benefits of the procedure.  He is aware he will need to have someone drive him to the hospital, stay with him during the procedure, drive him home and stay with him for 24 hours after anesthesia.  Patient verbalized understanding of the above.  He has consented to me that he is prepared to undergo this procedure.  He understands he will follow-up with me in a week to go over the results.  Test Results: PET scan 11/26/2023 IMPRESSION: 1. The 2.8 by 0.8 cm right upper lobe nodule has a maximum standard uptake value of 4.3. This is suspicious for  malignancy. 2. No findings of metastatic disease. 3. 7 by 6 mm subpleural nodule in the lingula has no substantial metabolic activity but is at the lower limits sensitive PET-CT characterization. 4. Centrilobular emphysema. 5. Coronary, aortic arch, and branch vessel atherosclerotic vascular disease.   LDCT Screening 10/21/2023 Severe centrilobular emphysema with diffuse bronchial wall thickening. No acute consolidative airspace disease or lung masses. No significant growth of previously visualized pulmonary nodules. Several new bilateral pulmonary nodules, the largest and most suspicious of which is an irregular bilobed solid right upper lobe nodule measuring 17.7 mm in volume derived mean diameter (series 3/image 72). Lung-RADS 4B, suspicious. Additional imaging evaluation or consultation with Pulmonology or Thoracic Surgery recommended. Several new bilateral pulmonary nodules, the largest and most suspicious of which is an irregular bilobed solid right upper lobe nodule measuring 17.7 mm in volume derived mean diameter.     Latest Ref Rng & Units 06/12/2023    4:17 PM 09/25/2022   11:42 AM 12/21/2020    4:59 PM  CBC  WBC 4.0 - 10.5 K/uL 7.8  6.8  8.6   Hemoglobin 13.0 - 17.0 g/dL 82.6  83.0  82.5   Hematocrit 39.0 - 52.0 % 51.5  48.9  50.1   Platelets 150.0 - 400.0 K/uL 325.0  287.0  253        Latest Ref Rng & Units 10/01/2023  3:57 PM 06/28/2023    1:51 PM 06/12/2023    4:17 PM  BMP  Glucose 70 - 99 mg/dL 92  829  92   BUN 6 - 23 mg/dL 5  5  4    Creatinine 0.40 - 1.50 mg/dL 8.92  8.98  8.96   Sodium 135 - 145 mEq/L 135  136  131   Potassium 3.5 - 5.1 mEq/L 4.3  4.4  4.6   Chloride 96 - 112 mEq/L 98  100  94   CO2 19 - 32 mEq/L 30  28  29    Calcium  8.4 - 10.5 mg/dL 9.2  9.2  9.7     BNP No results found for: BNP  ProBNP No results found for: PROBNP  PFT No results found for: FEV1PRE, FEV1POST, FVCPRE, FVCPOST, TLC, DLCOUNC, PREFEV1FVCRT,  PSTFEV1FVCRT  NM PET Image Initial (PI) Skull Base To Thigh Result Date: 12/02/2023 CLINICAL DATA:  Initial treatment strategy for pulmonary nodule. EXAM: NUCLEAR MEDICINE PET SKULL BASE TO THIGH TECHNIQUE: 7.7 mCi F-18 FDG was injected intravenously. Full-ring PET imaging was performed from the skull base to thigh after the radiotracer. CT data was obtained and used for attenuation correction and anatomic localization. Fasting blood glucose: 110 mg/dl COMPARISON:  Chest CT 87/76/7975 FINDINGS: Mediastinal blood pool activity: SUV max 2.3 Liver activity: SUV max NA NECK: Okay No significant abnormal hypermetabolic activity in this region. Incidental CT findings: Atheromatous vascular calcification of the left common carotid artery. CHEST: A 2.8 by 0.8 cm right upper lobe nodule shown on image 15 series 7 has a maximum standard uptake value 4.3. A 0.9 by 0.5 by 0.3 cm nodule along the right middle lobe side of the minor fissure has no significant metabolic activity and is probably a Peri fissural lymph. 7 by 6 mm subpleural nodule in the lingula has no substantial metabolic activity but is at the lower limits sensitive PET-CT characterization. Incidental CT findings: Centrilobular emphysema. Coronary, aortic arch, and branch vessel atherosclerotic vascular disease. ABDOMEN/PELVIS: No significant abnormal hypermetabolic activity in this region. Incidental CT findings: Prior cholecystectomy. Atherosclerosis is present, including aortoiliac atherosclerotic disease. SKELETON: No significant abnormal hypermetabolic activity in this region. Incidental CT findings: None. IMPRESSION: 1. The 2.8 by 0.8 cm right upper lobe nodule has a maximum standard uptake value of 4.3. This is suspicious for malignancy. 2. No findings of metastatic disease. 3. 7 by 6 mm subpleural nodule in the lingula has no substantial metabolic activity but is at the lower limits sensitive PET-CT characterization. 4. Centrilobular emphysema. 5.  Coronary, aortic arch, and branch vessel atherosclerotic vascular disease. Aortic Atherosclerosis (ICD10-I70.0) and Emphysema (ICD10-J43.9). Electronically Signed   By: Ryan Salvage M.D.   On: 12/02/2023 09:23     Past medical hx Past Medical History:  Diagnosis Date   Emphysema lung (HCC)    GERD (gastroesophageal reflux disease)    Hypertension    Hypertriglyceridemia    Schizophrenia (HCC)      Social History   Tobacco Use   Smoking status: Every Day    Current packs/day: 1.00    Average packs/day: 1 pack/day for 41.0 years (41.0 ttl pk-yrs)    Types: Cigars, Cigarettes   Smokeless tobacco: Never   Tobacco comments:    09/13/23: pt updates he has cut down his smoking to 10 cigs a day as he is interested in quitting in smoking.   Vaping Use   Vaping status: Never Used  Substance Use Topics   Alcohol use: No  Alcohol/week: 0.0 standard drinks of alcohol   Drug use: No    Travis Lawrence reports that he has been smoking cigars and cigarettes. He has a 41 pack-year smoking history. He has never used smokeless tobacco. He reports that he does not drink alcohol and does not use drugs.  Tobacco Cessation: Ready to quit: Not Answered Counseling given: Not Answered Tobacco comments: 09/13/23: pt updates he has cut down his smoking to 10 cigs a day as he is interested in quitting in smoking.  Current everyday smoker with a 41-pack-year smoking history. Patient states he is only smoking 1 to 2 cigarettes a day but he smells very strongly of cigarette smoke this morning. Patient has been counseled for 3 to 4 minutes on smoking cessation. He has been provided with smoking cessation tools, see AVS  Past surgical hx, Family hx, Social hx all reviewed.  Current Outpatient Medications on File Prior to Visit  Medication Sig   albuterol  (VENTOLIN  HFA) 108 (90 Base) MCG/ACT inhaler TAKE 2 PUFFS BY MOUTH EVERY 6 HOURS AS NEEDED FOR WHEEZE OR SHORTNESS OF BREATH   aspirin  EC 81 MG  tablet Take 81 mg by mouth daily. Swallow whole.   Fluticasone -Umeclidin-Vilant (TRELEGY ELLIPTA ) 100-62.5-25 MCG/ACT AEPB Inhale 1 puff into the lungs daily.   haloperidol decanoate (HALDOL DECANOATE) 100 MG/ML injection Inject 100 mg into the muscle every 28 (twenty-eight) days.   Multiple Vitamins-Minerals (PRESERVISION AREDS 2) CAPS TAKE 1 CAPSULE BY MOUTH TWICE A DAY   rosuvastatin  (CRESTOR ) 10 MG tablet Take 1 tablet (10 mg total) by mouth at bedtime.   No current facility-administered medications on file prior to visit.     Allergies  Allergen Reactions   Penicillin G Other (See Comments)   Penicillins Hives    Review Of Systems:  Constitutional:   No  weight loss, night sweats,  Fevers, chills, fatigue, or  lassitude.  HEENT:   No headaches,  Difficulty swallowing,  Tooth/dental problems, or  Sore throat,                No sneezing, itching, ear ache, nasal congestion, post nasal drip,   CV:  No chest pain,  Orthopnea, PND, swelling in lower extremities, anasarca, dizziness, palpitations, syncope.   GI  No heartburn, indigestion, abdominal pain, nausea, vomiting, diarrhea, change in bowel habits, loss of appetite, bloody stools.   Resp: No shortness of breath with exertion or at rest.  No excess mucus, no productive cough,  No non-productive cough,  No coughing up of blood.  No change in color of mucus.  No wheezing.  No chest wall deformity  Skin: no rash or lesions.  GU: no dysuria, change in color of urine, no urgency or frequency.  No flank pain, no hematuria   MS:  No joint pain or swelling.  No decreased range of motion.  No back pain.  Psych:  No change in mood or affect. No depression or anxiety.  No memory loss.   Vital Signs BP 114/68 (BP Location: Right Arm, Cuff Size: Normal)   Pulse 76   Ht 5' 11 (1.803 m)   Wt 153 lb 12.8 oz (69.8 kg)   SpO2 93%   BMI 21.45 kg/m    Physical Exam:  General- No distress,  A&Ox3, pleasant and appropriate ENT: No  sinus tenderness, TM clear, pale nasal mucosa, no oral exudate,no post nasal drip, no LAN Cardiac: S1, S2, regular rate and rhythm, no murmur Chest: No wheeze/ rales/ dullness; no accessory muscle use,  no nasal flaring, no sternal retractions, positive expiratory wheezes lower lobes Abd.: Soft Non-tender, nondistended, bowel sounds positive,Body mass index is 21.45 kg/m.  Ext: No clubbing cyanosis, edema Neuro:  normal strength, moving all extremities x 4, alert and oriented x 3 Skin: No rashes, warm and dry, no obvious skin lesions Psych: normal mood and behavior   Assessment/Plan .8 by 0.8 cm right upper lobe nodule has a maximum standard uptake value of 4.3. This is suspicious for malignancy. Current every day smoker Plan Your PET scan does show that the nodule we have been watching is hypermetabolic . You and I have discussed this and you agree next best step is to do a biopsy. We have discussed the risks and benefits of the procedure. You have consented to have this done. Please hold aspirin  x 48 hours before the procedure. We will give you a letter today with all of the details of the procedure. You will follow up with me 1 week after the procedure. Remember you will need someone to drive you to the hospital the morning of the procedure, stay during the procedure, and drive you home. You will also need to have someone with you for 24 hours after the procedure. Continue to work on quitting smoking. You can receive free nicotine  replacement therapy (patches, gum, or mints) by calling 1-800-QUIT NOW. Please call so we can get you on the path to becoming a non-smoker. I know it is hard, but you can do this!  Hypnosis for smoking cessation  [employer] [phone]  Acupuncture for smoking cessation  [employer] [phone]   Please contact office for sooner follow up if symptoms do not improve or worsen or seek emergency care     I spent 20 minutes  dedicated to the care of this patient on the date of this encounter to include pre-visit review of records, face-to-face time with the patient discussing conditions above, post visit ordering of testing, clinical documentation with the electronic health record, making appropriate referrals as documented, and communicating necessary information to the patient's healthcare team.    Lauraine JULIANNA Lites, NP 12/03/2023  8:30 AM

## 2023-12-03 NOTE — Progress Notes (Signed)
 History of Present Illness Travis Lawrence is a 68 y.o. male current every day ( 41 pack year smoking history) smoker followed through the screening program. Here for evaluation of an abnormal screening scan .  Synopsis Pt had a LDCT 10/20/2024 which was read as a 4 B. There was a   a new  irregular bilobed solid right upper lobe nodule measuring 17.7 mm. PET imaging was done, and patient is here today to review the results.  12/03/2023 Pt. Presents for follow up after PET scan to better evaluate  a new  irregular bilobed solid right upper lobe nodule measuring 17.7 mm  discovered on Lung Cancer screening. Pt. Is continuing to smoke about 1-2 cigarettes daily. He states he has been at his baseline. He is compliant with his Trelegy. We have provided him with Trelegy samples, as this is really helping with his breathing. He does use albuterol  at least daily.  We have discussed the results of his PET scan.  PET scan shows the 2.8 x 0.8 cm right upper lobe nodule has a maximum SUV uptake of 4.3.  This is suspicious for malignancy.  The 7 x 6 mm subpleural nodule did not have substantial metabolic activity however it is below the limits of PET sensitivity. We discussed that to best provide care neck step will be a biopsy of the right upper lobe nodule to evaluate it for a malignancy.  Patient is in agreement with this plan.  We have reviewed the risks and benefits of the procedure.  He is aware he will need to have someone drive him to the hospital, stay with him during the procedure, drive him home and stay with him for 24 hours after anesthesia.  Patient verbalized understanding of the above.  He has consented to me that he is prepared to undergo this procedure.  He understands he will follow-up with me in a week to go over the results.  Test Results: PET scan 11/26/2023 IMPRESSION: 1. The 2.8 by 0.8 cm right upper lobe nodule has a maximum standard uptake value of 4.3. This is suspicious for  malignancy. 2. No findings of metastatic disease. 3. 7 by 6 mm subpleural nodule in the lingula has no substantial metabolic activity but is at the lower limits sensitive PET-CT characterization. 4. Centrilobular emphysema. 5. Coronary, aortic arch, and branch vessel atherosclerotic vascular disease.   LDCT Screening 10/21/2023 Severe centrilobular emphysema with diffuse bronchial wall thickening. No acute consolidative airspace disease or lung masses. No significant growth of previously visualized pulmonary nodules. Several new bilateral pulmonary nodules, the largest and most suspicious of which is an irregular bilobed solid right upper lobe nodule measuring 17.7 mm in volume derived mean diameter (series 3/image 72). Lung-RADS 4B, suspicious. Additional imaging evaluation or consultation with Pulmonology or Thoracic Surgery recommended. Several new bilateral pulmonary nodules, the largest and most suspicious of which is an irregular bilobed solid right upper lobe nodule measuring 17.7 mm in volume derived mean diameter.     Latest Ref Rng & Units 06/12/2023    4:17 PM 09/25/2022   11:42 AM 12/21/2020    4:59 PM  CBC  WBC 4.0 - 10.5 K/uL 7.8  6.8  8.6   Hemoglobin 13.0 - 17.0 g/dL 82.6  83.0  82.5   Hematocrit 39.0 - 52.0 % 51.5  48.9  50.1   Platelets 150.0 - 400.0 K/uL 325.0  287.0  253        Latest Ref Rng & Units 10/01/2023  3:57 PM 06/28/2023    1:51 PM 06/12/2023    4:17 PM  BMP  Glucose 70 - 99 mg/dL 92  829  92   BUN 6 - 23 mg/dL 5  5  4    Creatinine 0.40 - 1.50 mg/dL 8.92  8.98  8.96   Sodium 135 - 145 mEq/L 135  136  131   Potassium 3.5 - 5.1 mEq/L 4.3  4.4  4.6   Chloride 96 - 112 mEq/L 98  100  94   CO2 19 - 32 mEq/L 30  28  29    Calcium  8.4 - 10.5 mg/dL 9.2  9.2  9.7     BNP No results found for: BNP  ProBNP No results found for: PROBNP  PFT No results found for: FEV1PRE, FEV1POST, FVCPRE, FVCPOST, TLC, DLCOUNC, PREFEV1FVCRT,  PSTFEV1FVCRT  NM PET Image Initial (PI) Skull Base To Thigh Result Date: 12/02/2023 CLINICAL DATA:  Initial treatment strategy for pulmonary nodule. EXAM: NUCLEAR MEDICINE PET SKULL BASE TO THIGH TECHNIQUE: 7.7 mCi F-18 FDG was injected intravenously. Full-ring PET imaging was performed from the skull base to thigh after the radiotracer. CT data was obtained and used for attenuation correction and anatomic localization. Fasting blood glucose: 110 mg/dl COMPARISON:  Chest CT 87/76/7975 FINDINGS: Mediastinal blood pool activity: SUV max 2.3 Liver activity: SUV max NA NECK: Okay No significant abnormal hypermetabolic activity in this region. Incidental CT findings: Atheromatous vascular calcification of the left common carotid artery. CHEST: A 2.8 by 0.8 cm right upper lobe nodule shown on image 15 series 7 has a maximum standard uptake value 4.3. A 0.9 by 0.5 by 0.3 cm nodule along the right middle lobe side of the minor fissure has no significant metabolic activity and is probably a Peri fissural lymph. 7 by 6 mm subpleural nodule in the lingula has no substantial metabolic activity but is at the lower limits sensitive PET-CT characterization. Incidental CT findings: Centrilobular emphysema. Coronary, aortic arch, and branch vessel atherosclerotic vascular disease. ABDOMEN/PELVIS: No significant abnormal hypermetabolic activity in this region. Incidental CT findings: Prior cholecystectomy. Atherosclerosis is present, including aortoiliac atherosclerotic disease. SKELETON: No significant abnormal hypermetabolic activity in this region. Incidental CT findings: None. IMPRESSION: 1. The 2.8 by 0.8 cm right upper lobe nodule has a maximum standard uptake value of 4.3. This is suspicious for malignancy. 2. No findings of metastatic disease. 3. 7 by 6 mm subpleural nodule in the lingula has no substantial metabolic activity but is at the lower limits sensitive PET-CT characterization. 4. Centrilobular emphysema. 5.  Coronary, aortic arch, and branch vessel atherosclerotic vascular disease. Aortic Atherosclerosis (ICD10-I70.0) and Emphysema (ICD10-J43.9). Electronically Signed   By: Ryan Salvage M.D.   On: 12/02/2023 09:23     Past medical hx Past Medical History:  Diagnosis Date   Emphysema lung (HCC)    GERD (gastroesophageal reflux disease)    Hypertension    Hypertriglyceridemia    Schizophrenia (HCC)      Social History   Tobacco Use   Smoking status: Every Day    Current packs/day: 1.00    Average packs/day: 1 pack/day for 41.0 years (41.0 ttl pk-yrs)    Types: Cigars, Cigarettes   Smokeless tobacco: Never   Tobacco comments:    09/13/23: pt updates he has cut down his smoking to 10 cigs a day as he is interested in quitting in smoking.   Vaping Use   Vaping status: Never Used  Substance Use Topics   Alcohol use: No  Alcohol/week: 0.0 standard drinks of alcohol   Drug use: No    Mr.Mccann reports that he has been smoking cigars and cigarettes. He has a 41 pack-year smoking history. He has never used smokeless tobacco. He reports that he does not drink alcohol and does not use drugs.  Tobacco Cessation: Ready to quit: Not Answered Counseling given: Not Answered Tobacco comments: 09/13/23: pt updates he has cut down his smoking to 10 cigs a day as he is interested in quitting in smoking.  Current everyday smoker with a 41-pack-year smoking history. Patient states he is only smoking 1 to 2 cigarettes a day but he smells very strongly of cigarette smoke this morning. Patient has been counseled for 3 to 4 minutes on smoking cessation. He has been provided with smoking cessation tools, see AVS  Past surgical hx, Family hx, Social hx all reviewed.  Current Outpatient Medications on File Prior to Visit  Medication Sig   albuterol  (VENTOLIN  HFA) 108 (90 Base) MCG/ACT inhaler TAKE 2 PUFFS BY MOUTH EVERY 6 HOURS AS NEEDED FOR WHEEZE OR SHORTNESS OF BREATH   aspirin  EC 81 MG  tablet Take 81 mg by mouth daily. Swallow whole.   Fluticasone -Umeclidin-Vilant (TRELEGY ELLIPTA ) 100-62.5-25 MCG/ACT AEPB Inhale 1 puff into the lungs daily.   haloperidol decanoate (HALDOL DECANOATE) 100 MG/ML injection Inject 100 mg into the muscle every 28 (twenty-eight) days.   Multiple Vitamins-Minerals (PRESERVISION AREDS 2) CAPS TAKE 1 CAPSULE BY MOUTH TWICE A DAY   rosuvastatin  (CRESTOR ) 10 MG tablet Take 1 tablet (10 mg total) by mouth at bedtime.   No current facility-administered medications on file prior to visit.     Allergies  Allergen Reactions   Penicillin G Other (See Comments)   Penicillins Hives    Review Of Systems:  Constitutional:   No  weight loss, night sweats,  Fevers, chills, fatigue, or  lassitude.  HEENT:   No headaches,  Difficulty swallowing,  Tooth/dental problems, or  Sore throat,                No sneezing, itching, ear ache, nasal congestion, post nasal drip,   CV:  No chest pain,  Orthopnea, PND, swelling in lower extremities, anasarca, dizziness, palpitations, syncope.   GI  No heartburn, indigestion, abdominal pain, nausea, vomiting, diarrhea, change in bowel habits, loss of appetite, bloody stools.   Resp: No shortness of breath with exertion or at rest.  No excess mucus, no productive cough,  No non-productive cough,  No coughing up of blood.  No change in color of mucus.  No wheezing.  No chest wall deformity  Skin: no rash or lesions.  GU: no dysuria, change in color of urine, no urgency or frequency.  No flank pain, no hematuria   MS:  No joint pain or swelling.  No decreased range of motion.  No back pain.  Psych:  No change in mood or affect. No depression or anxiety.  No memory loss.   Vital Signs BP 114/68 (BP Location: Right Arm, Cuff Size: Normal)   Pulse 76   Ht 5' 11 (1.803 m)   Wt 153 lb 12.8 oz (69.8 kg)   SpO2 93%   BMI 21.45 kg/m    Physical Exam:  General- No distress,  A&Ox3, pleasant and appropriate ENT: No  sinus tenderness, TM clear, pale nasal mucosa, no oral exudate,no post nasal drip, no LAN Cardiac: S1, S2, regular rate and rhythm, no murmur Chest: No wheeze/ rales/ dullness; no accessory muscle use,  no nasal flaring, no sternal retractions, positive expiratory wheezes lower lobes Abd.: Soft Non-tender, nondistended, bowel sounds positive,Body mass index is 21.45 kg/m.  Ext: No clubbing cyanosis, edema Neuro:  normal strength, moving all extremities x 4, alert and oriented x 3 Skin: No rashes, warm and dry, no obvious skin lesions Psych: normal mood and behavior   Assessment/Plan .8 by 0.8 cm right upper lobe nodule has a maximum standard uptake value of 4.3. This is suspicious for malignancy. Current every day smoker Plan Your PET scan does show that the nodule we have been watching is hypermetabolic . You and I have discussed this and you agree next best step is to do a biopsy. We have discussed the risks and benefits of the procedure. You have consented to have this done. Please hold aspirin  x 48 hours before the procedure. We will give you a letter today with all of the details of the procedure. You will follow up with me 1 week after the procedure. Remember you will need someone to drive you to the hospital the morning of the procedure, stay during the procedure, and drive you home. You will also need to have someone with you for 24 hours after the procedure. Continue to work on quitting smoking. You can receive free nicotine  replacement therapy (patches, gum, or mints) by calling 1-800-QUIT NOW. Please call so we can get you on the path to becoming a non-smoker. I know it is hard, but you can do this!  Hypnosis for smoking cessation  [employer] [phone]  Acupuncture for smoking cessation  [employer] [phone]   Please contact office for sooner follow up if symptoms do not improve or worsen or seek emergency care     I spent 20 minutes  dedicated to the care of this patient on the date of this encounter to include pre-visit review of records, face-to-face time with the patient discussing conditions above, post visit ordering of testing, clinical documentation with the electronic health record, making appropriate referrals as documented, and communicating necessary information to the patient's healthcare team.    Lauraine JULIANNA Lites, NP 12/03/2023  8:30 AM

## 2023-12-03 NOTE — Patient Instructions (Addendum)
 It is good to see you today. Your PET scan does show that the nodule we have been watching is hypermetabolic . You and I have discussed this and you agree next best step is to do a biopsy. We have discussed the risks and benefits of the procedure. You have consented to have this done. Please hold aspirin  x 48 hours before the procedure. We will give you a letter today with all of the details of the procedure. You will follow up with me 1 week after the procedure. Remember you will need someone to drive you to the hospital the morning of the procedure, stay during the procedure, and drive you home. You will also need to have someone with you for 24 hours after the procedure. Continue to work on quitting smoking. You can receive free nicotine  replacement therapy (patches, gum, or mints) by calling 1-800-QUIT NOW. Please call so we can get you on the path to becoming a non-smoker. I know it is hard, but you can do this!  Hypnosis for smoking cessation  [employer] [phone]  Acupuncture for smoking cessation  [employer] [phone]   Please contact office for sooner follow up if symptoms do not improve or worsen or seek emergency care

## 2023-12-03 NOTE — Telephone Encounter (Addendum)
 Left a voice message asking to give office a call. Will try calling again. ----- Message from JoAnna Williamson sent at 12/02/2023 10:57 AM EST ----- Reviewed labs from back in December 2024. I am just returning from maternity leave. Thank you for seeing Dr. Micheal while I was out. Continue all prescribed medications and look forward to seeing you in May.

## 2023-12-10 ENCOUNTER — Other Ambulatory Visit: Payer: Self-pay | Admitting: Family Medicine

## 2023-12-10 DIAGNOSIS — E781 Pure hyperglyceridemia: Secondary | ICD-10-CM

## 2023-12-16 ENCOUNTER — Encounter (HOSPITAL_COMMUNITY): Payer: Self-pay | Admitting: Emergency Medicine

## 2023-12-16 ENCOUNTER — Other Ambulatory Visit: Payer: Self-pay

## 2023-12-16 NOTE — Progress Notes (Signed)
 PCP - Zandra Abts, NP Cardiologist - denies  PPM/ICD - denies   Chest x-ray - denies EKG - DOS Stress Test - denies ECHO - denies Cardiac Cath - denies  CPAP - denies  DM- denies  Blood Thinner Instructions: n/a Aspirin Instructions: Hold 2 days. Pt took last dose 2/15.  ERAS Protcol - no, NPO  COVID TEST- n/a  Anesthesia review: no  Patient verbally denies any shortness of breath, fever, cough and chest pain during phone call       Questions were answered. Patient verbalized understanding of instructions.

## 2023-12-16 NOTE — Pre-Procedure Instructions (Signed)
-------------    SDW INSTRUCTIONS given:  Your procedure is scheduled on 2/18.  Report to [facility] Main Entrance "A" at 07:45 A.M., and check in at the Admitting office.  Any questions or running late day of surgery: call [phone]    Remember:  Do not eat or drink after midnight the night before your surgery     Take these medicines the morning of surgery with A SIP OF WATER  Albuterol PRN- bring inhaler Trelegy Ellipta  Hold Aspirin 2 days. Last dose 2/15  As of today, STOP taking any Aleve, Naproxen, Ibuprofen, Motrin, Advil, Goody's, BC's, all herbal medications, fish oil, and all vitamins.   Do NOT Smoke (Tobacco/Vaping) 24 hours prior to your procedure  If you use a CPAP at night, you may bring all equipment for your overnight stay.     You will be asked to remove any contacts, glasses, piercing's, hearing aid's, dentures/partials prior to surgery. Please bring cases for these items if needed.     Patients discharged the day of surgery will not be allowed to drive home, and someone needs to stay with them for 24 hours.  SURGICAL WAITING ROOM VISITATION Patients may have no more than 2 support people in the waiting area - these visitors may rotate.   Pre-op nurse will coordinate an appropriate time for 1 ADULT support person, who may not rotate, to accompany patient in pre-op.  Children under the age of 63 must have an adult with them who is not the patient and must remain in the main waiting area with an adult.  If the patient needs to stay at the hospital during part of their recovery, the visitor guidelines for inpatient rooms apply.  Please refer to the [facility] website for the visitor guidelines for any additional information.   Special instructions:   [place]- Preparing For Surgery   Please follow these instructions carefully.   Shower the NIGHT BEFORE SURGERY and the MORNING OF SURGERY with DIAL Soap.   Pat yourself dry with a CLEAN TOWEL.  Wear  CLEAN PAJAMAS to bed the night before surgery  Place CLEAN SHEETS on your bed the night of your first shower and DO NOT SLEEP WITH PETS.   Additional instructions for the day of surgery: DO NOT APPLY any lotions, deodorants, cologne, or perfumes.   Do not wear jewelry or makeup Do not wear nail polish, gel polish, artificial nails, or any other type of covering on natural nails (fingers and toes) Do not bring valuables to the hospital. [facility] is not responsible for valuables/personal belongings. Put on clean/comfortable clothes.  Please brush your teeth.  Ask your nurse before applying any prescription medications to the skin.

## 2023-12-17 ENCOUNTER — Encounter (HOSPITAL_COMMUNITY)
Admission: RE | Disposition: A | Discharge status: Home / Self Care | Payer: Self-pay | Source: Ambulatory Visit | Attending: Emergency Medicine

## 2023-12-17 ENCOUNTER — Ambulatory Visit (HOSPITAL_COMMUNITY)
Admission: RE | Admit: 2023-12-17 | Discharge: 2023-12-17 | Disposition: A | Discharge status: Home / Self Care | Payer: Medicare HMO | Source: Ambulatory Visit | Attending: Emergency Medicine | Admitting: Emergency Medicine

## 2023-12-17 ENCOUNTER — Ambulatory Visit (HOSPITAL_BASED_OUTPATIENT_CLINIC_OR_DEPARTMENT_OTHER): Payer: Self-pay | Admitting: Anesthesiology

## 2023-12-17 ENCOUNTER — Encounter (HOSPITAL_COMMUNITY): Payer: Self-pay | Admitting: Emergency Medicine

## 2023-12-17 ENCOUNTER — Ambulatory Visit (HOSPITAL_COMMUNITY): Payer: Self-pay | Admitting: Anesthesiology

## 2023-12-17 ENCOUNTER — Ambulatory Visit (HOSPITAL_COMMUNITY): Payer: Medicare HMO

## 2023-12-17 DIAGNOSIS — R911 Solitary pulmonary nodule: Secondary | ICD-10-CM | POA: Diagnosis not present

## 2023-12-17 DIAGNOSIS — K219 Gastro-esophageal reflux disease without esophagitis: Secondary | ICD-10-CM | POA: Insufficient documentation

## 2023-12-17 DIAGNOSIS — F17210 Nicotine dependence, cigarettes, uncomplicated: Secondary | ICD-10-CM

## 2023-12-17 DIAGNOSIS — R846 Abnormal cytological findings in specimens from respiratory organs and thorax: Secondary | ICD-10-CM | POA: Diagnosis not present

## 2023-12-17 DIAGNOSIS — I10 Essential (primary) hypertension: Secondary | ICD-10-CM | POA: Diagnosis not present

## 2023-12-17 DIAGNOSIS — F209 Schizophrenia, unspecified: Secondary | ICD-10-CM | POA: Insufficient documentation

## 2023-12-17 DIAGNOSIS — F17290 Nicotine dependence, other tobacco product, uncomplicated: Secondary | ICD-10-CM | POA: Insufficient documentation

## 2023-12-17 DIAGNOSIS — E785 Hyperlipidemia, unspecified: Secondary | ICD-10-CM | POA: Diagnosis not present

## 2023-12-17 DIAGNOSIS — J984 Other disorders of lung: Secondary | ICD-10-CM | POA: Diagnosis not present

## 2023-12-17 DIAGNOSIS — Z79899 Other long term (current) drug therapy: Secondary | ICD-10-CM | POA: Insufficient documentation

## 2023-12-17 DIAGNOSIS — R918 Other nonspecific abnormal finding of lung field: Secondary | ICD-10-CM | POA: Diagnosis not present

## 2023-12-17 DIAGNOSIS — Z48813 Encounter for surgical aftercare following surgery on the respiratory system: Secondary | ICD-10-CM | POA: Diagnosis not present

## 2023-12-17 HISTORY — PX: BRONCHIAL BIOPSY: SHX5109

## 2023-12-17 HISTORY — PX: VIDEO BRONCHOSCOPY WITH RADIAL ENDOBRONCHIAL ULTRASOUND: SHX6849

## 2023-12-17 HISTORY — PX: FIDUCIAL MARKER PLACEMENT: SHX6858

## 2023-12-17 HISTORY — PX: BRONCHIAL WASHINGS: SHX5105

## 2023-12-17 HISTORY — PX: BRONCHIAL NEEDLE ASPIRATION BIOPSY: SHX5106

## 2023-12-17 HISTORY — PX: BRONCHIAL BRUSHINGS: SHX5108

## 2023-12-17 LAB — BASIC METABOLIC PANEL
Anion gap: 12 (ref 5–15)
BUN: <5 mg/dL — ABNORMAL LOW (ref 8–23)
CO2: 25 mmol/L (ref 22–32)
Calcium: 9.4 mg/dL (ref 8.9–10.3)
Chloride: 97 mmol/L — ABNORMAL LOW (ref 98–111)
Creatinine, Ser: 0.88 mg/dL (ref 0.61–1.24)
GFR, Estimated: >60 mL/min (ref >60)
Glucose, Bld: 121 mg/dL — ABNORMAL HIGH (ref 70–99)
Potassium: 3.8 mmol/L (ref 3.5–5.1)
Sodium: 134 mmol/L — ABNORMAL LOW (ref 135–145)

## 2023-12-17 LAB — CBC
HCT: 48.1 % (ref 39.0–52.0)
Hemoglobin: 16.8 g/dL (ref 13.0–17.0)
MCH: 32.3 pg (ref 26.0–34.0)
MCHC: 34.9 g/dL (ref 30.0–36.0)
MCV: 92.5 fL (ref 80.0–100.0)
Platelets: 277 10*3/uL (ref 150–400)
RBC: 5.2 MIL/uL (ref 4.22–5.81)
RDW: 12.9 % (ref 11.5–15.5)
WBC: 9.1 10*3/uL (ref 4.0–10.5)
nRBC: 0 % (ref 0.0–0.2)

## 2023-12-17 SURGERY — BRONCHOSCOPY, WITH BIOPSY USING ELECTROMAGNETIC NAVIGATION
Anesthesia: General | Laterality: Right

## 2023-12-17 MED ORDER — FENTANYL CITRATE (PF) 250 MCG/5ML IJ SOLN
INTRAMUSCULAR | Status: DC | PRN
  Administered 2023-12-17: 50 ug via INTRAVENOUS

## 2023-12-17 MED ORDER — SODIUM CHLORIDE 0.9 % IV SOLN: INTRAVENOUS | Status: DC | PRN

## 2023-12-17 MED ORDER — OXYCODONE HCL 5 MG PO TABS: 5.0000 mg | ORAL_TABLET | Freq: Once | ORAL | Status: DC | PRN

## 2023-12-17 MED ORDER — ROCURONIUM BROMIDE 10 MG/ML (PF) SYRINGE
PREFILLED_SYRINGE | INTRAVENOUS | Status: DC | PRN
  Administered 2023-12-17: 50 mg via INTRAVENOUS
  Administered 2023-12-17: 10 mg via INTRAVENOUS

## 2023-12-17 MED ORDER — SODIUM CHLORIDE 0.9% FLUSH: 3.0000 mL | Freq: Two times a day (BID) | INTRAVENOUS | Status: DC

## 2023-12-17 MED ORDER — ONDANSETRON HCL 4 MG/2ML IJ SOLN
INTRAMUSCULAR | Status: DC | PRN
  Administered 2023-12-17: 4 mg via INTRAVENOUS

## 2023-12-17 MED ORDER — SUGAMMADEX SODIUM 200 MG/2ML IV SOLN
INTRAVENOUS | Status: DC | PRN
  Administered 2023-12-17: 150 mg via INTRAVENOUS

## 2023-12-17 MED ORDER — OXYCODONE HCL 5 MG/5ML PO SOLN: 5.0000 mg | Freq: Once | ORAL | Status: DC | PRN

## 2023-12-17 MED ORDER — ACETAMINOPHEN 325 MG PO TABS: 325.0000 mg | ORAL_TABLET | ORAL | Status: DC | PRN

## 2023-12-17 MED ORDER — ONDANSETRON HCL 4 MG/2ML IJ SOLN: 4.0000 mg | Freq: Once | INTRAMUSCULAR | Status: DC | PRN

## 2023-12-17 MED ORDER — PHENYLEPHRINE 80 MCG/ML (10ML) SYRINGE FOR IV PUSH (FOR BLOOD PRESSURE SUPPORT)
PREFILLED_SYRINGE | INTRAVENOUS | Status: DC | PRN
  Administered 2023-12-17 (×6): 160 ug via INTRAVENOUS

## 2023-12-17 MED ORDER — LIDOCAINE 2% (20 MG/ML) 5 ML SYRINGE
INTRAMUSCULAR | Status: DC | PRN
  Administered 2023-12-17: 100 mg via INTRAVENOUS

## 2023-12-17 MED ORDER — FENTANYL CITRATE (PF) 100 MCG/2ML IJ SOLN: 25.0000 ug | INTRAMUSCULAR | Status: DC | PRN

## 2023-12-17 MED ORDER — PROPOFOL 500 MG/50ML IV EMUL
INTRAVENOUS | Status: DC | PRN
Start: 2023-12-17 — End: 2023-12-17
  Administered 2023-12-17: 70 ug/kg/min via INTRAVENOUS

## 2023-12-17 MED ORDER — EPHEDRINE SULFATE-NACL 50-0.9 MG/10ML-% IV SOSY
PREFILLED_SYRINGE | INTRAVENOUS | Status: DC | PRN
  Administered 2023-12-17: 10 mg via INTRAVENOUS

## 2023-12-17 MED ORDER — MEPERIDINE HCL 25 MG/ML IJ SOLN: 6.2500 mg | INTRAMUSCULAR | Status: DC | PRN

## 2023-12-17 MED ORDER — SODIUM CHLORIDE 0.9% FLUSH: 3.0000 mL | INTRAVENOUS | Status: DC | PRN

## 2023-12-17 MED ORDER — ACETAMINOPHEN 160 MG/5ML PO SOLN: 325.0000 mg | ORAL | Status: DC | PRN

## 2023-12-17 MED ORDER — PROPOFOL 10 MG/ML IV BOLUS
INTRAVENOUS | Status: DC | PRN
  Administered 2023-12-17: 150 mg via INTRAVENOUS
  Administered 2023-12-17 (×3): 20 mg via INTRAVENOUS
  Administered 2023-12-17: 30 mg via INTRAVENOUS

## 2023-12-17 SURGICAL SUPPLY — 1 items: superlock fiducial marker IMPLANT

## 2023-12-17 NOTE — Op Note (Signed)
 Video Bronchoscopy with Robotic Assisted Bronchoscopic Navigation   Date of Operation: 12/17/2023   Pre-op Diagnosis: Right upper lobe nodule  Post-op Diagnosis: Same  Surgeon: Levy Pupa  Assistants: None  Anesthesia: General endotracheal anesthesia  Operation: Flexible video fiberoptic bronchoscopy with robotic assistance and biopsies.  Estimated Blood Loss: Minimal  Complications: None  Indications and History: Travis Lawrence is a 68 y.o. male with history of tobacco use participating in lung cancer screening program.  He has a bilobed linear right upper lobe pulmonary nodule with mild hypermetabolism on PET scan.  Recommendation made to achieve a tissue diagnosis via robotic assisted navigational bronchoscopy. The risks, benefits, complications, treatment options and expected outcomes were discussed with the patient.  The possibilities of pneumothorax, pneumonia, reaction to medication, pulmonary aspiration, perforation of a viscus, bleeding, failure to diagnose a condition and creating a complication requiring transfusion or operation were discussed with the patient who freely signed the consent.    Description of Procedure: The patient was seen in the Preoperative Area, was examined and was deemed appropriate to proceed.  The patient was taken to [place] endoscopy room 3, identified as Travis Lawrence and the procedure verified as Flexible Video Fiberoptic Bronchoscopy.  A Time Out was held and the above information confirmed.   Prior to the date of the procedure a high-resolution CT scan of the chest was performed. Utilizing ION software program a virtual tracheobronchial tree was generated to allow the creation of distinct navigation pathways to the patient's parenchymal abnormalities. After being taken to the operating room general anesthesia was initiated and the patient  was orally intubated. The video fiberoptic bronchoscope was introduced via the endotracheal tube and a general  inspection was performed which showed somewhat ectatic airways but otherwise normal right and left lung anatomy. Aspiration of the bilateral mainstems was completed to remove any remaining secretions. Robotic catheter inserted into patient's endotracheal tube.   Target #1 right upper lobe nodule: The distinct navigation pathways prepared prior to this procedure were then utilized to navigate to patient's lesion identified on CT scan. The robotic catheter was secured into place and the vision probe was withdrawn.  Lesion location was approximated using fluoroscopy and radial endobronchial ultrasound for peripheral targeting.  Local registration and targeting was performed using Cios three-dimensional imaging.  Under fluoroscopic guidance transbronchial needle brushings, transbronchial needle biopsies, and transbronchial forceps biopsies were performed to be sent for cytology and pathology.  Needle in lesion was confirmed using Cios.  A bronchioalveolar lavage was performed in the right upper lobe and sent for microbiology.  Under fluoroscopic guidance a single fiducial marker was placed adjacent to the nodule.  At the end of the procedure a general airway inspection was performed and there was no evidence of active bleeding. The bronchoscope was removed.  The patient tolerated the procedure well. There was no significant blood loss and there were no obvious complications. A post-procedural chest x-ray is pending.  Samples Target #1: 1. Transbronchial needle brushings from right upper lobe nodule 2. Transbronchial Wang needle biopsies from right upper lobe nodule 3. Transbronchial forceps biopsies from right upper lobe nodule 4. Bronchoalveolar lavage from right upper lobe   Plans:  The patient will be discharged from the PACU to home when recovered from anesthesia and after chest x-ray is reviewed. We will review the cytology, pathology and microbiology results with the patient when they become  available. Outpatient followup will be with Saralyn Pilar, NP or Dr. [name].   Levy Pupa, MD,  PhD 12/17/2023, 11:35 AM  Pulmonary and Critical Care [phone] or if no answer before 7:00PM call [phone] For any issues after 7:00PM please call eLink [phone]

## 2023-12-17 NOTE — Anesthesia Preprocedure Evaluation (Signed)
 Anesthesia Evaluation  Patient identified by MRN, date of birth, ID band Patient awake    Reviewed: Allergy & Precautions, H&P , NPO status , Patient's Chart, lab work & pertinent test results  Airway Mallampati: II  TM Distance: >3 FB Neck ROM: Full    Dental no notable dental hx. (+) Edentulous Upper, Edentulous Lower, Dental Advisory Given   Pulmonary neg pulmonary ROS, Current Smoker, former smoker   Pulmonary exam normal breath sounds clear to auscultation       Cardiovascular hypertension, Pt. on medications  Rhythm:Regular Rate:Normal     Neuro/Psych  PSYCHIATRIC DISORDERS    Schizophrenia  negative neurological ROS     GI/Hepatic Neg liver ROS,GERD  Medicated and Controlled,,  Endo/Other  negative endocrine ROS    Renal/GU negative Renal ROS  negative genitourinary   Musculoskeletal   Abdominal   Peds  Hematology negative hematology ROS (+)   Anesthesia Other Findings   Reproductive/Obstetrics negative OB ROS                              Anesthesia Physical Anesthesia Plan  ASA: 3  Anesthesia Plan: General   Post-op Pain Management: Minimal or no pain anticipated   Induction: Intravenous  PONV Risk Score and Plan: 2 and Ondansetron and Treatment may vary due to age or medical condition  Airway Management Planned: Oral ETT  Additional Equipment: None  Intra-op Plan:   Post-operative Plan: Extubation in OR  Informed Consent: I have reviewed the patients History and Physical, chart, labs and discussed the procedure including the risks, benefits and alternatives for the proposed anesthesia with the patient or authorized representative who has indicated his/her understanding and acceptance.     Dental advisory given  Plan Discussed with: CRNA and Anesthesiologist  Anesthesia Plan Comments: (  )         Anesthesia Quick Evaluation

## 2023-12-17 NOTE — Discharge Instructions (Addendum)
 Flexible Bronchoscopy, Care After This sheet gives you information about how to care for yourself after your test. Your doctor may also give you more specific instructions. If you have problems or questions, contact your doctor. Follow these instructions at home: Eating and drinking Do not eat or drink anything (not even water) for 2 hours after your test, or until your numbing medicine (local anesthetic) wears off. When your numbness is gone and your cough and gag reflexes have come back, you may: Eat only soft foods. Slowly drink liquids. When you get home after the test, go back to your normal diet. Driving Do not drive for 24 hours if you were given a medicine to help you relax (sedative). Do not drive or use heavy machinery while taking prescription pain medicine. General instructions  Take over-the-counter and prescription medicines only as told by your doctor. Return to your normal activities as told. Ask what activities are safe for you. Do not use any products that have nicotine or tobacco in them. This includes cigarettes and e-cigarettes. If you need help quitting, ask your doctor. Keep all follow-up visits as told by your doctor. This is important. It is very important if you had a tissue sample (biopsy) taken. Get help right away if: You have shortness of breath that gets worse. You get light-headed. You feel like you are going to pass out (faint). You have chest pain. You cough up: More than a little blood. More blood than before. Summary Do not eat or drink anything (not even water) for 2 hours after your test, or until your numbing medicine wears off. Do not use cigarettes. Do not use e-cigarettes. Get help right away if you have chest pain.  Please call our office for any questions or concerns.  [phone].  This information is not intended to replace advice given to you by your health care provider. Make sure you discuss any questions you have with your health  care provider. Document Released: 08/12/2009 Document Revised: 09/27/2017 Document Reviewed: 11/02/2016 Elsevier Patient Education  2020 [employer].

## 2023-12-17 NOTE — Interval H&P Note (Signed)
 History and Physical Interval Note:  12/17/2023 9:04 AM  Travis Lawrence  has presented today for surgery, with the diagnosis of PET AVID LUNG MASS RIGHT LUNG.  The various methods of treatment have been discussed with the patient and family. After consideration of risks, benefits and other options for treatment, the patient has consented to  Procedure(s): ROBOTIC ASSISTED NAVIGATIONAL BRONCHOSCOPY WITH FLUORO (Right) as a surgical intervention.  The patient's history has been reviewed, patient examined, no change in status, stable for surgery.  I have reviewed the patient's chart and labs.  Questions were answered to the patient's satisfaction.     Leslye Peer

## 2023-12-17 NOTE — Anesthesia Procedure Notes (Signed)
 Procedure Name: Intubation Date/Time: 12/17/2023 10:28 AM  Performed by: Thomasene Ripple, CRNAPre-anesthesia Checklist: Patient identified, Emergency Drugs available, Suction available and Patient being monitored Patient Re-evaluated:Patient Re-evaluated prior to induction Oxygen Delivery Method: Circle System Utilized Preoxygenation: Pre-oxygenation with 100% oxygen Induction Type: IV induction Ventilation: Mask ventilation without difficulty and Oral airway inserted - appropriate to patient size Laryngoscope Size: Miller and 3 Grade View: Grade I Tube type: Oral Tube size: 8.5 mm Number of attempts: 1 Airway Equipment and Method: Stylet and Oral airway Placement Confirmation: ETT inserted through vocal cords under direct vision, positive ETCO2 and breath sounds checked- equal and bilateral Secured at: 23 cm Tube secured with: Tape Dental Injury: Teeth and Oropharynx as per pre-operative assessment

## 2023-12-17 NOTE — Anesthesia Postprocedure Evaluation (Signed)
 Anesthesia Post Note  Patient: Travis Lawrence  Procedure(s) Performed: ROBOTIC ASSISTED NAVIGATIONAL BRONCHOSCOPY WITH FLUORO (Right) VIDEO BRONCHOSCOPY WITH RADIAL ENDOBRONCHIAL ULTRASOUND BRONCHIAL BIOPSIES BRONCHIAL BRUSHINGS BRONCHIAL NEEDLE ASPIRATION BIOPSIES BRONCHIAL WASHINGS FIDUCIAL MARKER PLACEMENT     Patient location during evaluation: PACU Anesthesia Type: General Level of consciousness: awake and alert Pain management: pain level controlled Vital Signs Assessment: post-procedure vital signs reviewed and stable Respiratory status: spontaneous breathing, nonlabored ventilation, respiratory function stable and patient connected to nasal cannula oxygen Cardiovascular status: blood pressure returned to baseline and stable Postop Assessment: no apparent nausea or vomiting Anesthetic complications: no   No notable events documented.  Last Vitals:  Vitals:   12/17/23 1200 12/17/23 1210  BP: (!) 115/96   Pulse: 100 91  Resp: 18 15  Temp:  36.7 C  SpO2: 91% 91%    Last Pain:  Vitals:   12/17/23 1200  TempSrc:   PainSc: 0-No pain                 Maykayla Highley

## 2023-12-17 NOTE — Transfer of Care (Signed)
 Immediate Anesthesia Transfer of Care Note  Patient: Travis Lawrence  Procedure(s) Performed: ROBOTIC ASSISTED NAVIGATIONAL BRONCHOSCOPY WITH FLUORO (Right) VIDEO BRONCHOSCOPY WITH RADIAL ENDOBRONCHIAL ULTRASOUND BRONCHIAL BIOPSIES BRONCHIAL BRUSHINGS BRONCHIAL NEEDLE ASPIRATION BIOPSIES BRONCHIAL WASHINGS FIDUCIAL MARKER PLACEMENT  Patient Location: PACU  Anesthesia Type:General  Level of Consciousness: awake, alert , and oriented  Airway & Oxygen Therapy: Patient Spontanous Breathing and Patient connected to face mask oxygen  Post-op Assessment: Report given to RN and Post -op Vital signs reviewed and stable  Post vital signs: Reviewed and stable  Last Vitals:  Vitals Value Taken Time  BP 129/92 12/17/23 1145  Temp 36.7 C 12/17/23 1142  Pulse 97 12/17/23 1145  Resp 15 12/17/23 1145  SpO2 95 % 12/17/23 1145  Vitals shown include unfiled device data.  Last Pain:  Vitals:   12/17/23 0813  TempSrc:   PainSc: 0-No pain         Complications: No notable events documented.

## 2023-12-19 LAB — ACID FAST SMEAR (AFB, MYCOBACTERIA): Acid Fast Smear: NEGATIVE

## 2023-12-19 LAB — CULTURE, BAL-QUANTITATIVE W GRAM STAIN: Gram Stain: NONE SEEN

## 2023-12-20 ENCOUNTER — Encounter (HOSPITAL_COMMUNITY): Payer: Self-pay | Admitting: Emergency Medicine

## 2023-12-20 LAB — CYTOLOGY - NON PAP

## 2023-12-22 LAB — AEROBIC/ANAEROBIC CULTURE W GRAM STAIN (SURGICAL/DEEP WOUND)
Culture: NO GROWTH
Gram Stain: NONE SEEN

## 2023-12-24 ENCOUNTER — Encounter: Payer: Self-pay | Admitting: Acute Care

## 2023-12-24 ENCOUNTER — Ambulatory Visit (INDEPENDENT_AMBULATORY_CARE_PROVIDER_SITE_OTHER): Payer: Medicare HMO | Admitting: Acute Care

## 2023-12-24 VITALS — BP 134/87 | HR 76 | Temp 98.0°F | Ht 71.0 in | Wt 155.4 lb

## 2023-12-24 DIAGNOSIS — R918 Other nonspecific abnormal finding of lung field: Secondary | ICD-10-CM | POA: Diagnosis not present

## 2023-12-24 DIAGNOSIS — J439 Emphysema, unspecified: Secondary | ICD-10-CM | POA: Diagnosis not present

## 2023-12-24 DIAGNOSIS — Z9889 Other specified postprocedural states: Secondary | ICD-10-CM | POA: Diagnosis not present

## 2023-12-24 DIAGNOSIS — F17200 Nicotine dependence, unspecified, uncomplicated: Secondary | ICD-10-CM | POA: Diagnosis not present

## 2023-12-24 MED ORDER — TRELEGY ELLIPTA 100-62.5-25 MCG/ACT IN AEPB: 1.0000 | INHALATION_SPRAY | Freq: Every day | RESPIRATORY_TRACT | 11 refills | Status: DC

## 2023-12-24 MED ORDER — TRELEGY ELLIPTA 100-62.5-25 MCG/ACT IN AEPB: 1.0000 | INHALATION_SPRAY | Freq: Every day | RESPIRATORY_TRACT | Status: DC

## 2023-12-24 NOTE — Progress Notes (Signed)
 History of Present Illness Travis Lawrence is a 68 y.o. male current every day smoker( 41 pack year smoking history) followed through the screening program. Here for evaluation of an abnormal screening scan .He will be followed by Travis Robinsons NP and DrDelton Lawrence  Synopsis Pt had a LDCT 10/20/2024 which was read as a 4 B. There was a  a new  irregular bilobed solid right upper lobe nodule measuring 17.7 mm. PET imaging was done, which showed uptake in the right upper lobe nodule. Patient was scheduled for bronchoscopy with biopsies on 12/17/2023 per Dr. Delton Lawrence. He is here today to review cytology and ensure he has done well post procedure.    12/24/2023 Pt. Presents for follow up. He states he has done well after his bronchoscopy with biopsies. He denies any fever, bleeding, discolored secretions, shortness of breath beyond his baseline, or adverse reaction to anesthesia.   We have discussed his cytology results. The Right upper lobe pulmonary nodules were diagnosed as atypical cells, favoring chronic inflammation and reactive cells.  Plan will be for a 3 month follow up  CT Chest to continue to monitor these nodules. He is in agreement with this plan.   Pt. Is still smoking about 10 cigars a day. I have counseled him extensively to quit. I have provided him with smoking cessation aids.  (See AVS)  Test Results: Cytology 12/17/2023 FINAL MICROSCOPIC DIAGNOSIS:  A. LUNG, RUL, FINE NEEDLE ASPIRATION  BIOPSY:  - Atypical cells present, favor reactive  - Chronic inflammation   B. LUNG, RUL, BRUSHING:  - Atypical cells present, favor reactive   BAL Negative for growth, final Fungal negative AFB negative    Latest Ref Rng & Units 12/17/2023    8:14 AM 06/12/2023    4:17 PM 09/25/2022   11:42 AM  CBC  WBC 4.0 - 10.5 K/uL 9.1  7.8  6.8   Hemoglobin 13.0 - 17.0 g/dL 16.1  09.6  04.5   Hematocrit 39.0 - 52.0 % 48.1  51.5  48.9   Platelets 150 - 400 K/uL 277  325.0  287.0        Latest Ref  Rng & Units 12/17/2023    8:14 AM 10/01/2023    3:57 PM 06/28/2023    1:51 PM  BMP  Glucose 70 - 99 mg/dL 409  92  811   BUN 8 - 23 mg/dL 5  5  5    Creatinine 0.61 - 1.24 mg/dL 9.14  7.82  9.56   Sodium 135 - 145 mmol/L 134  135  136   Potassium 3.5 - 5.1 mmol/L 3.8  4.3  4.4   Chloride 98 - 111 mmol/L 97  98  100   CO2 22 - 32 mmol/L 25  30  28    Calcium 8.9 - 10.3 mg/dL 9.4  9.2  9.2     BNP No results found for: "BNP"  ProBNP No results found for: "PROBNP"  PFT No results found for: "FEV1PRE", "FEV1POST", "FVCPRE", "FVCPOST", "TLC", "DLCOUNC", "PREFEV1FVCRT", "PSTFEV1FVCRT"  DG Chest Port 1 View Result Date: 12/17/2023 CLINICAL DATA:  Status post bronchoscopy with biopsy. EXAM: PORTABLE CHEST 1 VIEW COMPARISON:  May 26, 2015. FINDINGS: The heart size and mediastinal contours are within normal limits. Minimal bibasilar reticular densities are noted concerning for subsegmental atelectasis or possibly edema. The visualized skeletal structures are unremarkable. IMPRESSION: Minimal bibasilar subsegmental atelectasis or possibly edema. Electronically Signed   By: Travis Lawrence M.D.   On: 12/17/2023 14:24  DG C-ARM BRONCHOSCOPY Result Date: 12/17/2023 C-ARM BRONCHOSCOPY: Fluoroscopy was utilized by the requesting physician.  No radiographic interpretation.   NM PET Image Initial (PI) Skull Base To Thigh Result Date: 12/02/2023 CLINICAL DATA:  Initial treatment strategy for pulmonary nodule. EXAM: NUCLEAR MEDICINE PET SKULL BASE TO THIGH TECHNIQUE: 7.7 mCi F-18 FDG was injected intravenously. Full-ring PET imaging was performed from the skull base to thigh after the radiotracer. CT data was obtained and used for attenuation correction and anatomic localization. Fasting blood glucose: 110 mg/dl COMPARISON:  Chest CT 16/07/9603 FINDINGS: Mediastinal blood pool activity: SUV max 2.3 Liver activity: SUV max NA NECK: Okay No significant abnormal hypermetabolic activity in this region.  Incidental CT findings: Atheromatous vascular calcification of the left common carotid artery. CHEST: A 2.8 by 0.8 cm right upper lobe nodule shown on image 15 series 7 has a maximum standard uptake value 4.3. A 0.9 by 0.5 by 0.3 cm nodule along the right middle lobe side of the minor fissure has no significant metabolic activity and is probably a Peri fissural lymph. 7 by 6 mm subpleural nodule in the lingula has no substantial metabolic activity but is at the lower limits sensitive PET-CT characterization. Incidental CT findings: Centrilobular emphysema. Coronary, aortic arch, and branch vessel atherosclerotic vascular disease. ABDOMEN/PELVIS: No significant abnormal hypermetabolic activity in this region. Incidental CT findings: Prior cholecystectomy. Atherosclerosis is present, including aortoiliac atherosclerotic disease. SKELETON: No significant abnormal hypermetabolic activity in this region. Incidental CT findings: None. IMPRESSION: 1. The 2.8 by 0.8 cm right upper lobe nodule has a maximum standard uptake value of 4.3. This is suspicious for malignancy. 2. No findings of metastatic disease. 3. 7 by 6 mm subpleural nodule in the lingula has no substantial metabolic activity but is at the lower limits sensitive PET-CT characterization. 4. Centrilobular emphysema. 5. Coronary, aortic arch, and branch vessel atherosclerotic vascular disease. Aortic Atherosclerosis (ICD10-I70.0) and Emphysema (ICD10-J43.9). Electronically Signed   By: Travis Lawrence M.D.   On: 12/02/2023 09:23     Past medical hx Past Medical History:  Diagnosis Date   Emphysema lung (HCC)    GERD (gastroesophageal reflux disease)    Hypertension    Hypertriglyceridemia    Schizophrenia (HCC)      Social History   Tobacco Use   Smoking status: Every Day    Types: Cigars   Smokeless tobacco: Never   Tobacco comments:    09/13/23: pt updates he has cut down his smoking to 10 cigars a day as he is interested in quitting in  smoking.   Vaping Use   Vaping status: Never Used  Substance Use Topics   Alcohol use: No    Alcohol/week: 0.0 standard drinks of alcohol   Drug use: No    Mr.Travis Lawrence reports that he has been smoking cigars. He has never used smokeless tobacco. He reports that he does not drink alcohol and does not use drugs.  Tobacco Cessation: Ready to quit: Not Answered Counseling given: Not Answered Tobacco comments: 09/13/23: pt updates he has cut down his smoking to 10 cigars a day as he is interested in quitting in smoking.    Past surgical hx, Family hx, Social hx all reviewed.  Current Outpatient Medications on File Prior to Visit  Medication Sig   albuterol (VENTOLIN HFA) 108 (90 Base) MCG/ACT inhaler TAKE 2 PUFFS BY MOUTH EVERY 6 HOURS AS NEEDED FOR WHEEZE OR SHORTNESS OF BREATH   aspirin EC 81 MG tablet Take 81 mg by mouth daily. Swallow whole.  Fluticasone-Umeclidin-Vilant (TRELEGY ELLIPTA) 100-62.5-25 MCG/ACT AEPB Inhale 1 puff into the lungs daily.   haloperidol decanoate (HALDOL DECANOATE) 100 MG/ML injection Inject 100 mg into the muscle every 28 (twenty-eight) days.   Multiple Vitamins-Minerals (PRESERVISION AREDS 2) CAPS TAKE 1 CAPSULE BY MOUTH TWICE A DAY   rosuvastatin (CRESTOR) 10 MG tablet TAKE 1 TABLET BY MOUTH EVERYDAY AT BEDTIME   No current facility-administered medications on file prior to visit.     Allergies  Allergen Reactions   Penicillin G Other (See Comments)   Penicillins Hives    Review Of Systems:  Constitutional:   No  weight loss, night sweats,  Fevers, chills, fatigue, or  lassitude.  HEENT:   No headaches,  Difficulty swallowing,  Tooth/dental problems, or  Sore throat,                No sneezing, itching, ear ache, nasal congestion, post nasal drip,   CV:  No chest pain,  Orthopnea, PND, swelling in lower extremities, anasarca, dizziness, palpitations, syncope.   GI  No heartburn, indigestion, abdominal pain, nausea, vomiting, diarrhea, change  in bowel habits, loss of appetite, bloody stools.   Resp: + shortness of breath with exertion less at rest.  + baseline  excess mucus, + baseline  productive cough,  No non-productive cough,  No coughing up of blood.  No change in color of mucus.  No wheezing.  No chest wall deformity  Skin: no rash or lesions.  GU: no dysuria, change in color of urine, no urgency or frequency.  No flank pain, no hematuria   MS:  No joint pain or swelling.  No decreased range of motion.  No back pain.  Psych:  No change in mood or affect. No depression or anxiety.  No memory loss.   Vital Signs BP 134/87 (BP Location: Right Arm, Patient Position: Sitting, Cuff Size: Large)   Pulse 76   Temp 98 F (36.7 C) (Oral)   Ht 5\' 11"  (1.803 m)   Wt 155 lb 6.4 oz (70.5 kg)   SpO2 95%   BMI 21.67 kg/m    Physical Exam:  General- No distress,  A&Ox3, pleasant ENT: No sinus tenderness, TM clear, pale nasal mucosa, no oral exudate,no post nasal drip, no LAN Cardiac: S1, S2, regular rate and rhythm, no murmur Chest: + Exp  wheeze/ No rales/ dullness; no accessory muscle use, no nasal flaring, no sternal retractions Abd.: Soft Non-tender, ND, BS +, Body mass index is 21.67 kg/m.  Ext: No clubbing cyanosis, edema Neuro:  normal strength, MAE x 4, A&O x 3, Appropriate Skin: No rashes, warm and dry, no obvious lesions  Psych: normal mood and behavior   Assessment/Plan Lung nodule Current every day smoker Post bronchoscopy with biopsies Biopsy shows reactive inflammatory cells , no malignancy Plan I am glad you have done well after the bronchoscopy with biopsies.  Your biopsies were negative for malignancy , but positive for atypical cells. Atypical cells are not normal cells.  We will do a follow up CT Chest in 3 months to follow these nodules.  This will be due 02/2024. You will get a call to get it scheduled.  Follow up with me in the office within a week of scan in May. You will also get a call to  get this scheduled.  Please work on quitting smoking. You can receive free nicotine replacement therapy (patches, gum, or mints) by calling 1-800-QUIT NOW. Please call so we can get you on the path  to becoming a non-smoker. I know it is hard, but you can do this!  Hypnosis for smoking cessation  [employer] [phone]  Acupuncture for smoking cessation  [employer] [phone]    Call if you need [place] sooner  Please contact office for sooner follow up if symptoms do not improve or worsen or seek emergency care    I spent 30 minutes dedicated to the care of this patient on the date of this encounter to include pre-visit review of records, face-to-face time with the patient discussing conditions above, post visit ordering of testing, clinical documentation with the electronic health record, making appropriate referrals as documented, and communicating necessary information to the patient's healthcare team.    Bevelyn Ngo, NP 12/24/2023  4:40 PM

## 2023-12-24 NOTE — Patient Instructions (Signed)
 It is good to see you today. I am glad you have done well after the bronchoscopy with biopsies.  Your biopsies were negative for malignancy , but positive for atypical cells. Atypical cells are not normal cells.  We will do a follow up CT Chest in 3 months to follow these nodules.  This will be due 02/2024. You will get a call to get it scheduled.  Follow up with me in the office within a week of scan in May. You will also get a call to get this scheduled.  Please work on quitting smoking. You can receive free nicotine replacement therapy (patches, gum, or mints) by calling 1-800-QUIT NOW. Please call so we can get you on the path to becoming a non-smoker. I know it is hard, but you can do this!  Hypnosis for smoking cessation  [employer] [phone]  Acupuncture for smoking cessation  [facility] [phone]    Call if you need [place] sooner  Please contact office for sooner follow up if symptoms do not improve or worsen or seek emergency care

## 2023-12-26 DIAGNOSIS — F200 Paranoid schizophrenia: Secondary | ICD-10-CM | POA: Diagnosis not present

## 2024-01-15 LAB — FUNGAL ORGANISM REFLEX

## 2024-01-15 LAB — FUNGUS CULTURE RESULT

## 2024-01-15 LAB — FUNGUS CULTURE WITH STAIN

## 2024-01-17 ENCOUNTER — Other Ambulatory Visit: Payer: Self-pay | Admitting: Family

## 2024-01-17 DIAGNOSIS — H3530 Unspecified macular degeneration: Secondary | ICD-10-CM

## 2024-01-24 ENCOUNTER — Other Ambulatory Visit: Payer: Self-pay | Admitting: Family Medicine

## 2024-01-24 DIAGNOSIS — J439 Emphysema, unspecified: Secondary | ICD-10-CM

## 2024-01-24 DIAGNOSIS — J432 Centrilobular emphysema: Secondary | ICD-10-CM

## 2024-01-24 NOTE — Telephone Encounter (Signed)
 Copied from CRM [id number]. Topic: Clinical - Medication Refill >> Jan 24, 2024 11:22 AM Eunice Blase wrote: Most Recent Primary Care Visit:  Provider: Kristian Covey  Department: [facility]  Visit Type: OFFICE VISIT  Date: 10/01/2023  Medication: albuterol (VENTOLIN HFA) 108 (90 Base) MCG/ACT inhaler and Fluticasone-Umeclidin-Vilant (TRELEGY ELLIPTA) 100-62.5-25 MCG/ACT AEPB  Has the patient contacted their pharmacy? Yes (Agent: If no, request that the patient contact the pharmacy for the refill. If patient does not wish to contact the pharmacy document the reason why and proceed with request.) (Agent: If yes, when and what did the pharmacy advise?)Pharmacy need PCP approval  Is this the correct pharmacy for this prescription? Yes If no, delete pharmacy and type the correct one.  This is the patient's preferred pharmacy:  [id number] [place], [place] - [address] AT [facility] [address] [place] [address] Phone: [phone] Fax: [phone]   Has the prescription been filled recently? Yes  Is the patient out of the medication? Yes  Has the patient been seen for an appointment in the last year OR does the patient have an upcoming appointment? Yes  Can we respond through MyChart? Yes  Agent: Please be advised that Rx refills may take up to 3 business days. We ask that you follow-up with your pharmacy.

## 2024-01-30 LAB — ACID FAST CULTURE WITH REFLEXED SENSITIVITIES (MYCOBACTERIA): Acid Fast Culture: NEGATIVE

## 2024-02-01 ENCOUNTER — Ambulatory Visit (HOSPITAL_BASED_OUTPATIENT_CLINIC_OR_DEPARTMENT_OTHER)
Admission: RE | Admit: 2024-02-01 | Discharge: 2024-02-01 | Disposition: A | Discharge status: Home / Self Care | Source: Ambulatory Visit | Attending: Acute Care | Admitting: Acute Care

## 2024-02-01 DIAGNOSIS — J439 Emphysema, unspecified: Secondary | ICD-10-CM | POA: Insufficient documentation

## 2024-02-01 DIAGNOSIS — R918 Other nonspecific abnormal finding of lung field: Secondary | ICD-10-CM | POA: Diagnosis not present

## 2024-02-01 DIAGNOSIS — J432 Centrilobular emphysema: Secondary | ICD-10-CM | POA: Diagnosis not present

## 2024-02-01 DIAGNOSIS — I700 Atherosclerosis of aorta: Secondary | ICD-10-CM | POA: Diagnosis not present

## 2024-02-03 ENCOUNTER — Other Ambulatory Visit: Payer: Self-pay | Admitting: Family Medicine

## 2024-02-03 DIAGNOSIS — E781 Pure hyperglyceridemia: Secondary | ICD-10-CM

## 2024-02-21 ENCOUNTER — Telehealth: Payer: Self-pay

## 2024-02-21 NOTE — Telephone Encounter (Signed)
 Copied from CRM [id number]. Topic: Clinical - Lab/Test Results >> Feb 21, 2024  4:00 PM Jenice Mitts wrote: Reason for CRM: Patient is calling because he would like a call about he most recent ct results

## 2024-02-24 ENCOUNTER — Telehealth: Payer: Self-pay | Admitting: Acute Care

## 2024-02-24 NOTE — Telephone Encounter (Signed)
 Called and spoke with pt and moved his appointment up for 5/20 with sarah to go over the ct results since he was due for a follow up.NFN

## 2024-02-24 NOTE — Telephone Encounter (Signed)
 Copied from CRM [id number]. Topic: Clinical - Lab/Test Results >> Feb 24, 2024 11:45 AM Travis Lawrence wrote: Reason for CRM: Patient would like a call to discuss CT results.

## 2024-03-12 ENCOUNTER — Ambulatory Visit (INDEPENDENT_AMBULATORY_CARE_PROVIDER_SITE_OTHER): Payer: No Typology Code available for payment source | Admitting: Family Medicine

## 2024-03-12 ENCOUNTER — Encounter: Payer: Self-pay | Admitting: Family Medicine

## 2024-03-12 VITALS — BP 128/84 | HR 78 | Temp 98.0°F | Ht 68.5 in | Wt 156.0 lb

## 2024-03-12 DIAGNOSIS — E781 Pure hyperglyceridemia: Secondary | ICD-10-CM

## 2024-03-12 DIAGNOSIS — H6121 Impacted cerumen, right ear: Secondary | ICD-10-CM | POA: Diagnosis not present

## 2024-03-12 DIAGNOSIS — E871 Hypo-osmolality and hyponatremia: Secondary | ICD-10-CM

## 2024-03-12 DIAGNOSIS — Z0001 Encounter for general adult medical examination with abnormal findings: Secondary | ICD-10-CM

## 2024-03-12 NOTE — Progress Notes (Signed)
 Complete physical exam  Patient: Travis Lawrence   DOB: October 24, 1956   68 y.o. Male  MRN: [id number]  Subjective:     Chief Complaint  Patient presents with   Annual Exam    Travis Lawrence is a 68 y.o. male who presents today for a complete physical exam. He reports consuming a general diet. The patient does not participate in regular exercise at present. He generally feels well. He reports sleeping well. He does not have additional problems to discuss today.    Most recent fall risk assessment:    03/12/2024    2:40 PM  Fall Risk   Falls in the past year? 0  Number falls in past yr: 0  Injury with Fall? 0  Risk for fall due to : No Fall Risks  Follow up Falls evaluation completed     Most recent depression screenings:    03/12/2024    2:40 PM 09/13/2023    3:33 PM  PHQ 2/9 Scores  PHQ - 2 Score 0 0  PHQ- 9 Score 0 0    Vision:Within last year and Dental: No current dental problems and No regular dental care   Past Medical History:  Diagnosis Date   Emphysema lung (HCC)    GERD (gastroesophageal reflux disease)    Hypertension    Hypertriglyceridemia    Schizophrenia (HCC)    Past Surgical History:  Procedure Laterality Date   BRONCHIAL BIOPSY  12/17/2023   Procedure: BRONCHIAL BIOPSIES;  Surgeon: Denson Flake, MD;  Location: [facility] ENDOSCOPY;  Service: Pulmonary;;   BRONCHIAL BRUSHINGS  12/17/2023   Procedure: BRONCHIAL BRUSHINGS;  Surgeon: Denson Flake, MD;  Location: [facility] ENDOSCOPY;  Service: Pulmonary;;   BRONCHIAL NEEDLE ASPIRATION BIOPSY  12/17/2023   Procedure: BRONCHIAL NEEDLE ASPIRATION BIOPSIES;  Surgeon: Denson Flake, MD;  Location: [facility] ENDOSCOPY;  Service: Pulmonary;;   BRONCHIAL WASHINGS  12/17/2023   Procedure: BRONCHIAL WASHINGS;  Surgeon: Denson Flake, MD;  Location: [facility] ENDOSCOPY;  Service: Pulmonary;;   CHOLECYSTECTOMY N/A 05/30/2015   Procedure: LAPAROSCOPIC SUBTOTAL CHOLECYSTECTOMY  ;  Surgeon: Aldean Hummingbird, MD;  Location: [facility] ORS;   Service: General;  Laterality: N/A;   FIDUCIAL MARKER PLACEMENT  12/17/2023   Procedure: FIDUCIAL MARKER PLACEMENT;  Surgeon: Denson Flake, MD;  Location: [facility] ENDOSCOPY;  Service: Pulmonary;;   TONSILLECTOMY     VIDEO BRONCHOSCOPY WITH RADIAL ENDOBRONCHIAL ULTRASOUND  12/17/2023   Procedure: VIDEO BRONCHOSCOPY WITH RADIAL ENDOBRONCHIAL ULTRASOUND;  Surgeon: Denson Flake, MD;  Location: [facility] ENDOSCOPY;  Service: Pulmonary;;   WISDOM TOOTH EXTRACTION     Social History   Tobacco Use   Smoking status: Every Day    Types: Cigars   Smokeless tobacco: Never   Tobacco comments:    09/13/23: pt updates he has cut down his smoking to 10 cigars a day as he is interested in quitting in smoking.   Vaping Use   Vaping status: Never Used  Substance Use Topics   Alcohol use: No    Alcohol/week: 0.0 standard drinks of alcohol   Drug use: No   Social History   Socioeconomic History   Marital status: Single    Spouse name: Not on file   Number of children: 0   Years of education: Not on file   Highest education level: Some college, no degree  Occupational History   Not on file  Tobacco Use   Smoking status: Every Day    Types: Cigars   Smokeless tobacco:  Never   Tobacco comments:    09/13/23: pt updates he has cut down his smoking to 10 cigars a day as he is interested in quitting in smoking.   Vaping Use   Vaping status: Never Used  Substance and Sexual Activity   Alcohol use: No    Alcohol/week: 0.0 standard drinks of alcohol   Drug use: No   Sexual activity: Yes  Other Topics Concern   Not on file  Social History Narrative   Not on file   Social Drivers of Health   Financial Resource Strain: Low Risk  (07/10/2023)   Overall Financial Resource Strain (CARDIA)    Difficulty of Paying Living Expenses: Not hard at all  Food Insecurity: No Food Insecurity (07/10/2023)   Hunger Vital Sign    Worried About Running Out of Food in the Last Year: Never true    Ran Out of Food in the  Last Year: Never true  Transportation Needs: No Transportation Needs (07/10/2023)   PRAPARE - Administrator, Civil Service (Medical): No    Lack of Transportation (Non-Medical): No  Physical Activity: Insufficiently Active (07/10/2023)   Exercise Vital Sign    Days of Exercise per Week: 3 days    Minutes of Exercise per Session: 30 min  Stress: No Stress Concern Present (07/10/2023)   [employer] of Occupational Health - Occupational Stress Questionnaire    Feeling of Stress : Not at all  Social Connections: Socially Isolated (07/10/2023)   Social Connection and Isolation Panel [NHANES]    Frequency of Communication with Friends and Family: Three times a week    Frequency of Social Gatherings with Friends and Family: Three times a week    Attends Religious Services: Never    Active Member of Clubs or Organizations: No    Attends Banker Meetings: Never    Marital Status: Never married  Intimate Partner Violence: Not At Risk (07/10/2023)   Humiliation, Afraid, Rape, and Kick questionnaire    Fear of Current or Ex-Partner: No    Emotionally Abused: No    Physically Abused: No    Sexually Abused: No   Family Status  Relation Name Status   Mother  Deceased   Father  Deceased   Sister  Alive   Brother  Alive   MGM  Deceased   MGF  Deceased   PGM  Deceased   PGF  Deceased  No partnership data on file   Family History  Problem Relation Age of Onset   Esophageal cancer Mother    Lung cancer Father    Heart attack Father    Lung cancer Sister    COPD Sister    Heart attack Brother    Allergies  Allergen Reactions   Penicillin G Other (See Comments)   Penicillins Hives    Patient Care Team: Francenia Ingle, NP as PCP - General (Family Medicine) Alvis Jourdain, MD as Consulting Physician (Gastroenterology) Dickey Fought, NP as Nurse Practitioner (Psychiatry) [place] At[place], Patricia Boon (Optometry) Raejean Bullock, NP as Nurse Practitioner (Pulmonary  Disease)   Outpatient Medications Prior to Visit  Medication Sig   albuterol  (VENTOLIN  HFA) 108 (90 Base) MCG/ACT inhaler TAKE 2 PUFFS BY MOUTH EVERY 6 HOURS AS NEEDED FOR WHEEZE OR SHORTNESS OF BREATH   aspirin EC 81 MG tablet Take 81 mg by mouth daily. Swallow whole.   Fluticasone -Umeclidin-Vilant (TRELEGY ELLIPTA ) 100-62.5-25 MCG/ACT AEPB Inhale 1 puff into the lungs daily.   Fluticasone -Umeclidin-Vilant (TRELEGY ELLIPTA ) 100-62.5-25  MCG/ACT AEPB Inhale 1 puff into the lungs daily at 2 PM.   Fluticasone -Umeclidin-Vilant (TRELEGY ELLIPTA ) 100-62.5-25 MCG/ACT AEPB Inhale 1 puff into the lungs daily.   haloperidol decanoate (HALDOL DECANOATE) 100 MG/ML injection Inject 100 mg into the muscle every 28 (twenty-eight) days.   Multiple Vitamins-Minerals (PRESERVISION AREDS 2) CAPS TAKE 1 CAPSULE BY MOUTH TWICE A DAY   rosuvastatin  (CRESTOR ) 10 MG tablet TAKE 1 TABLET BY MOUTH EVERYDAY AT BEDTIME   No facility-administered medications prior to visit.    Review of Systems  Constitutional: Negative.   HENT: Negative.    Eyes: Negative.   Respiratory:  Positive for cough, shortness of breath and wheezing.   Cardiovascular: Negative.   Gastrointestinal: Negative.   Genitourinary: Negative.   Musculoskeletal: Negative.   Skin: Negative.   Neurological: Negative.   Endo/Heme/Allergies: Negative.   Psychiatric/Behavioral: Negative.       Objective:   BP 128/84   Pulse 78   Temp 98 F (36.7 C) (Oral)   Ht 5' 8.5" (1.74 m)   Wt 156 lb (70.8 kg)   SpO2 98%   BMI 23.37 kg/m    Physical Exam Vitals reviewed.  Constitutional:      General: He is not in acute distress.    Appearance: Normal appearance. He is normal weight. He is not ill-appearing or toxic-appearing.  HENT:     Head: Normocephalic and atraumatic.     Right Ear: There is impacted cerumen.     Left Ear: Tympanic membrane, ear canal and external ear normal. There is no impacted cerumen.     Nose:     Right Sinus: No  maxillary sinus tenderness or frontal sinus tenderness.     Left Sinus: No maxillary sinus tenderness or frontal sinus tenderness.     Mouth/Throat:     Mouth: Mucous membranes are moist.     Pharynx: Oropharynx is clear. Uvula midline. No pharyngeal swelling, oropharyngeal exudate, posterior oropharyngeal erythema, uvula swelling or postnasal drip.  Eyes:     General:        Right eye: No discharge.        Left eye: No discharge.     Conjunctiva/sclera: Conjunctivae normal.     Pupils: Pupils are equal, round, and reactive to light.  Neck:     Thyroid : No thyromegaly.  Cardiovascular:     Rate and Rhythm: Normal rate and regular rhythm.     Pulses:          Posterior tibial pulses are 2+ on the right side and 2+ on the left side.     Heart sounds: Normal heart sounds. No murmur heard.    No friction rub. No gallop.  Pulmonary:     Effort: Pulmonary effort is normal. No respiratory distress.     Breath sounds: Normal breath sounds.  Abdominal:     General: Abdomen is flat. Bowel sounds are normal. There is no distension.     Palpations: Abdomen is soft. There is no mass.     Tenderness: There is no right CVA tenderness or left CVA tenderness.  Musculoskeletal:        General: Normal range of motion.     Cervical back: Normal range of motion and neck supple.     Right lower leg: No edema.     Left lower leg: No edema.  Lymphadenopathy:     Cervical: No cervical adenopathy.  Skin:    General: Skin is warm and dry.  Neurological:  General: No focal deficit present.     Mental Status: He is alert. Mental status is at baseline.  Psychiatric:        Mood and Affect: Mood normal.        Behavior: Behavior normal.        Thought Content: Thought content normal.        Judgment: Judgment normal.   PRE-PROCEDURE EXAM: Right TM cannot be visualized due to total occlusion/impaction of the ear canal. PROCEDURE INDICATION: Remove wax to visualize ear drum & relieve  discomfort CONSENT:  Verbal PROCEDURE NOTE: Right ear: The CMA, Kyleigh Sigmon, irrigated right ear with warm water and ear drops to remove the wax. 100% of the wax was removed.  POST- PROCEDURE EXAM: TMs successfully visualized. TM with no erythema. The patient tolerated the procedure well.      Assessment & Plan:    Routine Health Maintenance and Physical Exam  Immunization History  Administered Date(s) Administered   Influenza Whole 08/29/2006   Influenza, High Dose Seasonal PF 07/15/2023   Influenza,inj,Quad PF,6+ Mos 07/29/2018   Influenza-Unspecified 07/29/2018, 06/25/2022   PFIZER(Purple Top)SARS-COV-2 Vaccination 01/21/2020, 02/11/2020, 09/12/2020, 01/28/2021   Pfizer Covid-19 Vaccine Bivalent Booster 100yrs & up 07/15/2023   Pneumococcal Polysaccharide-23 11/20/2015   Respiratory Syncytial Virus Vaccine,Recomb Aduvanted(Arexvy) 06/25/2022   Td 06/05/2006    Health Maintenance  Topic Date Due   Colonoscopy  Never done   Zoster Vaccines- Shingrix (1 of 2) Never done   Pneumonia Vaccine 69+ Years old (2 of 2 - PCV) 12/23/2024 (Originally 11/19/2016)   INFLUENZA VACCINE  05/29/2024   Medicare Annual Wellness (AWV)  07/09/2024   Lung Cancer Screening  01/31/2025   Hepatitis C Screening  Completed   HPV VACCINES  Aged Out   Meningococcal B Vaccine  Aged Out   DTaP/Tdap/Td  Discontinued   COVID-19 Vaccine  Discontinued    Discussed health benefits of physical activity, and encouraged him to engage in regular exercise appropriate for his age and condition.  Encounter for annual general medical examination with abnormal findings in adult  Hearing loss of right ear due to cerumen impaction  HYPERTRIGLYCERIDEMIA -     Comprehensive metabolic panel with GFR -     Lipid panel  Low sodium levels -     CBC with Differential/Platelet   1.Review health maintenance:  -Colonoscopy: Seen Dr. Nickey Barn about colonoscopy, trying to decide about colonoscopy; recommend to have go  through with colonoscopy -Zoster vaccine: Had 1st injection-[facility] off of [address]; recommend 2nd vaccine -PNA: Recommend Prevnar 20 -Tdap: 3 years ago-[facility] off [address] -Covid booster-Fall at [facility] off of [address]  2.Physical exam completed today. 3.Right ear lavage completed. May use over the counter Debrox every 3-6 months to clean ear canal out.  4.Continue all medications.  5.Ordered labs (CBC, CMP, and lipid panel) based on medical history.  Return in about 6 months (around 09/12/2024) for chronic management.   Glenford Garis, NP

## 2024-03-12 NOTE — Patient Instructions (Addendum)
-  It was great to see you today. -Physical exam completed today. -Right ear lavage completed. May use over the counter Debrox every 3-6 months to clean ear canal out.  -Recommend to have colonoscopy. -Recommend to get your second Zoster (shingles) vaccine, Prevnar 20 (pneumonia vaccine).  -Continue all medications. -Ordered labs. -Follow up in 6 months.

## 2024-03-13 ENCOUNTER — Ambulatory Visit: Payer: Self-pay | Admitting: Family Medicine

## 2024-03-13 LAB — LIPID PANEL
Cholesterol: 139 mg/dL (ref 0–200)
HDL: 57.2 mg/dL (ref >39.00)
LDL Cholesterol: 65 mg/dL (ref 0–99)
NonHDL: 81.7
Total CHOL/HDL Ratio: 2
Triglycerides: 83 mg/dL (ref 0.0–149.0)
VLDL: 16.6 mg/dL (ref 0.0–40.0)

## 2024-03-13 LAB — COMPREHENSIVE METABOLIC PANEL WITH GFR
ALT: 10 U/L (ref 0–53)
AST: 16 U/L (ref 0–37)
Albumin: 4.3 g/dL (ref 3.5–5.2)
Alkaline Phosphatase: 98 U/L (ref 39–117)
BUN: 10 mg/dL (ref 6–23)
CO2: 28 meq/L (ref 19–32)
Calcium: 9.5 mg/dL (ref 8.4–10.5)
Chloride: 96 meq/L (ref 96–112)
Creatinine, Ser: 1 mg/dL (ref 0.40–1.50)
GFR: 77.37 mL/min (ref >60.00)
Glucose, Bld: 79 mg/dL (ref 70–99)
Potassium: 4 meq/L (ref 3.5–5.1)
Sodium: 133 meq/L — ABNORMAL LOW (ref 135–145)
Total Bilirubin: 0.9 mg/dL (ref 0.2–1.2)
Total Protein: 7.3 g/dL (ref 6.0–8.3)

## 2024-03-13 LAB — CBC WITH DIFFERENTIAL/PLATELET
Basophils Absolute: 0.1 10*3/uL (ref 0.0–0.1)
Basophils Relative: 1.1 % (ref 0.0–3.0)
Eosinophils Absolute: 0.3 10*3/uL (ref 0.0–0.7)
Eosinophils Relative: 4.2 % (ref 0.0–5.0)
HCT: 49.2 % (ref 39.0–52.0)
Hemoglobin: 16.6 g/dL (ref 13.0–17.0)
Lymphocytes Relative: 25.5 % (ref 12.0–46.0)
Lymphs Abs: 2 10*3/uL (ref 0.7–4.0)
MCHC: 33.7 g/dL (ref 30.0–36.0)
MCV: 95.7 fl (ref 78.0–100.0)
Monocytes Absolute: 0.6 10*3/uL (ref 0.1–1.0)
Monocytes Relative: 7.6 % (ref 3.0–12.0)
Neutro Abs: 4.8 10*3/uL (ref 1.4–7.7)
Neutrophils Relative %: 61.6 % (ref 43.0–77.0)
Platelets: 272 10*3/uL (ref 150.0–400.0)
RBC: 5.14 Mil/uL (ref 4.22–5.81)
RDW: 14.2 % (ref 11.5–15.5)
WBC: 7.8 10*3/uL (ref 4.0–10.5)

## 2024-03-17 ENCOUNTER — Ambulatory Visit: Admitting: Acute Care

## 2024-03-17 ENCOUNTER — Encounter (HOSPITAL_COMMUNITY): Payer: Self-pay

## 2024-03-17 ENCOUNTER — Other Ambulatory Visit: Payer: Self-pay

## 2024-03-17 ENCOUNTER — Inpatient Hospital Stay (HOSPITAL_COMMUNITY)
Admission: EM | Admit: 2024-03-17 | Discharge: 2024-03-24 | DRG: 189 | Disposition: A | Discharge status: Home / Self Care | Attending: Family Medicine | Admitting: Family Medicine

## 2024-03-17 ENCOUNTER — Emergency Department (HOSPITAL_COMMUNITY)

## 2024-03-17 DIAGNOSIS — J9601 Acute respiratory failure with hypoxia: Principal | ICD-10-CM | POA: Diagnosis present

## 2024-03-17 DIAGNOSIS — Z1152 Encounter for screening for COVID-19: Secondary | ICD-10-CM

## 2024-03-17 DIAGNOSIS — E781 Pure hyperglyceridemia: Secondary | ICD-10-CM | POA: Diagnosis not present

## 2024-03-17 DIAGNOSIS — Z23 Encounter for immunization: Secondary | ICD-10-CM

## 2024-03-17 DIAGNOSIS — I700 Atherosclerosis of aorta: Secondary | ICD-10-CM | POA: Diagnosis not present

## 2024-03-17 DIAGNOSIS — F17290 Nicotine dependence, other tobacco product, uncomplicated: Secondary | ICD-10-CM | POA: Diagnosis present

## 2024-03-17 DIAGNOSIS — R911 Solitary pulmonary nodule: Secondary | ICD-10-CM | POA: Diagnosis not present

## 2024-03-17 DIAGNOSIS — Z9049 Acquired absence of other specified parts of digestive tract: Secondary | ICD-10-CM

## 2024-03-17 DIAGNOSIS — J441 Chronic obstructive pulmonary disease with (acute) exacerbation: Principal | ICD-10-CM | POA: Diagnosis present

## 2024-03-17 DIAGNOSIS — K219 Gastro-esophageal reflux disease without esophagitis: Secondary | ICD-10-CM | POA: Diagnosis not present

## 2024-03-17 DIAGNOSIS — Z7951 Long term (current) use of inhaled steroids: Secondary | ICD-10-CM | POA: Diagnosis not present

## 2024-03-17 DIAGNOSIS — E871 Hypo-osmolality and hyponatremia: Secondary | ICD-10-CM | POA: Diagnosis not present

## 2024-03-17 DIAGNOSIS — Z800 Family history of malignant neoplasm of digestive organs: Secondary | ICD-10-CM

## 2024-03-17 DIAGNOSIS — Z8249 Family history of ischemic heart disease and other diseases of the circulatory system: Secondary | ICD-10-CM

## 2024-03-17 DIAGNOSIS — F200 Paranoid schizophrenia: Secondary | ICD-10-CM | POA: Diagnosis not present

## 2024-03-17 DIAGNOSIS — Z880 Allergy status to penicillin: Secondary | ICD-10-CM

## 2024-03-17 DIAGNOSIS — I10 Essential (primary) hypertension: Secondary | ICD-10-CM | POA: Diagnosis present

## 2024-03-17 DIAGNOSIS — Z801 Family history of malignant neoplasm of trachea, bronchus and lung: Secondary | ICD-10-CM | POA: Diagnosis not present

## 2024-03-17 DIAGNOSIS — Z825 Family history of asthma and other chronic lower respiratory diseases: Secondary | ICD-10-CM | POA: Diagnosis not present

## 2024-03-17 DIAGNOSIS — J439 Emphysema, unspecified: Secondary | ICD-10-CM | POA: Diagnosis present

## 2024-03-17 DIAGNOSIS — Z79899 Other long term (current) drug therapy: Secondary | ICD-10-CM | POA: Diagnosis not present

## 2024-03-17 DIAGNOSIS — R918 Other nonspecific abnormal finding of lung field: Secondary | ICD-10-CM | POA: Diagnosis not present

## 2024-03-17 DIAGNOSIS — I452 Bifascicular block: Secondary | ICD-10-CM | POA: Diagnosis not present

## 2024-03-17 DIAGNOSIS — R0602 Shortness of breath: Secondary | ICD-10-CM | POA: Diagnosis not present

## 2024-03-17 LAB — BASIC METABOLIC PANEL WITH GFR
Anion gap: 11 (ref 5–15)
BUN: 8 mg/dL (ref 8–23)
CO2: 25 mmol/L (ref 22–32)
Calcium: 8.9 mg/dL (ref 8.9–10.3)
Chloride: 95 mmol/L — ABNORMAL LOW (ref 98–111)
Creatinine, Ser: 0.87 mg/dL (ref 0.61–1.24)
GFR, Estimated: >60 mL/min (ref >60)
Glucose, Bld: 115 mg/dL — ABNORMAL HIGH (ref 70–99)
Potassium: 3.9 mmol/L (ref 3.5–5.1)
Sodium: 131 mmol/L — ABNORMAL LOW (ref 135–145)

## 2024-03-17 LAB — CBC
HCT: 50 % (ref 39.0–52.0)
Hemoglobin: 16.8 g/dL (ref 13.0–17.0)
MCH: 32.4 pg (ref 26.0–34.0)
MCHC: 33.6 g/dL (ref 30.0–36.0)
MCV: 96.5 fL (ref 80.0–100.0)
Platelets: 254 10*3/uL (ref 150–400)
RBC: 5.18 MIL/uL (ref 4.22–5.81)
RDW: 13.2 % (ref 11.5–15.5)
WBC: 8.5 10*3/uL (ref 4.0–10.5)
nRBC: 0 % (ref 0.0–0.2)

## 2024-03-17 LAB — RESP PANEL BY RT-PCR (RSV, FLU A&B, COVID)  RVPGX2
Influenza A by PCR: NEGATIVE
Influenza B by PCR: NEGATIVE
Resp Syncytial Virus by PCR: NEGATIVE
SARS Coronavirus 2 by RT PCR: NEGATIVE

## 2024-03-17 LAB — TROPONIN I (HIGH SENSITIVITY): Troponin I (High Sensitivity): 5 ng/L (ref <18)

## 2024-03-17 MED ORDER — IPRATROPIUM-ALBUTEROL 0.5-2.5 (3) MG/3ML IN SOLN
3.0000 mL | Freq: Once | RESPIRATORY_TRACT | Status: AC
  Administered 2024-03-17: 3 mL via RESPIRATORY_TRACT
  Filled 2024-03-17: qty 3

## 2024-03-17 MED ORDER — METHYLPREDNISOLONE SODIUM SUCC 125 MG IJ SOLR
125.0000 mg | Freq: Once | INTRAMUSCULAR | Status: AC
  Administered 2024-03-17: 125 mg via INTRAVENOUS
  Filled 2024-03-17: qty 2

## 2024-03-17 MED ORDER — IOHEXOL 350 MG/ML SOLN
75.0000 mL | Freq: Once | INTRAVENOUS | Status: AC | PRN
  Administered 2024-03-17: 75 mL via INTRAVENOUS

## 2024-03-17 NOTE — ED Triage Notes (Signed)
 Patient c/o SOB x 2 days. Pt report taking inhaler without relief. Patient o2 88-90% on RA at triage. Patient denies Chest pain. Hx COPD.

## 2024-03-18 ENCOUNTER — Encounter (HOSPITAL_COMMUNITY): Payer: Self-pay | Admitting: Family Medicine

## 2024-03-18 ENCOUNTER — Telehealth: Payer: Self-pay

## 2024-03-18 DIAGNOSIS — E781 Pure hyperglyceridemia: Secondary | ICD-10-CM | POA: Diagnosis present

## 2024-03-18 DIAGNOSIS — Z9049 Acquired absence of other specified parts of digestive tract: Secondary | ICD-10-CM | POA: Diagnosis not present

## 2024-03-18 DIAGNOSIS — I10 Essential (primary) hypertension: Secondary | ICD-10-CM | POA: Diagnosis present

## 2024-03-18 DIAGNOSIS — K219 Gastro-esophageal reflux disease without esophagitis: Secondary | ICD-10-CM | POA: Diagnosis present

## 2024-03-18 DIAGNOSIS — J9601 Acute respiratory failure with hypoxia: Secondary | ICD-10-CM | POA: Diagnosis present

## 2024-03-18 DIAGNOSIS — F200 Paranoid schizophrenia: Secondary | ICD-10-CM

## 2024-03-18 DIAGNOSIS — J439 Emphysema, unspecified: Secondary | ICD-10-CM | POA: Diagnosis present

## 2024-03-18 DIAGNOSIS — E871 Hypo-osmolality and hyponatremia: Secondary | ICD-10-CM | POA: Diagnosis present

## 2024-03-18 DIAGNOSIS — R911 Solitary pulmonary nodule: Secondary | ICD-10-CM | POA: Diagnosis present

## 2024-03-18 DIAGNOSIS — Z8249 Family history of ischemic heart disease and other diseases of the circulatory system: Secondary | ICD-10-CM | POA: Diagnosis not present

## 2024-03-18 DIAGNOSIS — J441 Chronic obstructive pulmonary disease with (acute) exacerbation: Secondary | ICD-10-CM | POA: Diagnosis present

## 2024-03-18 DIAGNOSIS — Z7951 Long term (current) use of inhaled steroids: Secondary | ICD-10-CM | POA: Diagnosis not present

## 2024-03-18 DIAGNOSIS — Z79899 Other long term (current) drug therapy: Secondary | ICD-10-CM | POA: Diagnosis not present

## 2024-03-18 DIAGNOSIS — I452 Bifascicular block: Secondary | ICD-10-CM | POA: Diagnosis present

## 2024-03-18 DIAGNOSIS — Z880 Allergy status to penicillin: Secondary | ICD-10-CM | POA: Diagnosis not present

## 2024-03-18 DIAGNOSIS — F17290 Nicotine dependence, other tobacco product, uncomplicated: Secondary | ICD-10-CM | POA: Diagnosis present

## 2024-03-18 DIAGNOSIS — Z800 Family history of malignant neoplasm of digestive organs: Secondary | ICD-10-CM | POA: Diagnosis not present

## 2024-03-18 DIAGNOSIS — Z23 Encounter for immunization: Secondary | ICD-10-CM | POA: Diagnosis not present

## 2024-03-18 DIAGNOSIS — R0602 Shortness of breath: Secondary | ICD-10-CM | POA: Diagnosis present

## 2024-03-18 DIAGNOSIS — Z1152 Encounter for screening for COVID-19: Secondary | ICD-10-CM | POA: Diagnosis not present

## 2024-03-18 DIAGNOSIS — Z801 Family history of malignant neoplasm of trachea, bronchus and lung: Secondary | ICD-10-CM | POA: Diagnosis not present

## 2024-03-18 DIAGNOSIS — Z825 Family history of asthma and other chronic lower respiratory diseases: Secondary | ICD-10-CM | POA: Diagnosis not present

## 2024-03-18 LAB — BASIC METABOLIC PANEL WITH GFR
Anion gap: 10 (ref 5–15)
BUN: 8 mg/dL (ref 8–23)
CO2: 24 mmol/L (ref 22–32)
Calcium: 9.2 mg/dL (ref 8.9–10.3)
Chloride: 101 mmol/L (ref 98–111)
Creatinine, Ser: 0.69 mg/dL (ref 0.61–1.24)
GFR, Estimated: >60 mL/min (ref >60)
Glucose, Bld: 148 mg/dL — ABNORMAL HIGH (ref 70–99)
Potassium: 4.1 mmol/L (ref 3.5–5.1)
Sodium: 135 mmol/L (ref 135–145)

## 2024-03-18 LAB — EXPECTORATED SPUTUM ASSESSMENT W GRAM STAIN, RFLX TO RESP C

## 2024-03-18 LAB — CBC
HCT: 47.9 % (ref 39.0–52.0)
Hemoglobin: 16.4 g/dL (ref 13.0–17.0)
MCH: 32.6 pg (ref 26.0–34.0)
MCHC: 34.2 g/dL (ref 30.0–36.0)
MCV: 95.2 fL (ref 80.0–100.0)
Platelets: 213 10*3/uL (ref 150–400)
RBC: 5.03 MIL/uL (ref 4.22–5.81)
RDW: 13.2 % (ref 11.5–15.5)
WBC: 5.6 10*3/uL (ref 4.0–10.5)
nRBC: 0 % (ref 0.0–0.2)

## 2024-03-18 LAB — HIV ANTIBODY (ROUTINE TESTING W REFLEX): HIV Screen 4th Generation wRfx: NONREACTIVE

## 2024-03-18 MED ORDER — ACETAMINOPHEN 325 MG PO TABS
650.0000 mg | ORAL_TABLET | Freq: Four times a day (QID) | ORAL | Status: DC | PRN
  Administered 2024-03-19 – 2024-03-21 (×6): 650 mg via ORAL
  Filled 2024-03-18 (×6): qty 2

## 2024-03-18 MED ORDER — BUDESONIDE 0.25 MG/2ML IN SUSP
0.2500 mg | Freq: Two times a day (BID) | RESPIRATORY_TRACT | Status: DC
  Administered 2024-03-18 – 2024-03-19 (×4): 0.25 mg via RESPIRATORY_TRACT
  Filled 2024-03-18 (×3): qty 2

## 2024-03-18 MED ORDER — GUAIFENESIN 100 MG/5ML PO LIQD: 5.0000 mL | ORAL | Status: DC | PRN

## 2024-03-18 MED ORDER — IPRATROPIUM-ALBUTEROL 0.5-2.5 (3) MG/3ML IN SOLN: 3.0000 mL | Freq: Four times a day (QID) | RESPIRATORY_TRACT | Status: DC

## 2024-03-18 MED ORDER — ENOXAPARIN SODIUM 40 MG/0.4ML IJ SOSY
40.0000 mg | PREFILLED_SYRINGE | INTRAMUSCULAR | Status: DC
  Administered 2024-03-18 – 2024-03-23 (×6): 40 mg via SUBCUTANEOUS
  Filled 2024-03-18 (×6): qty 0.4

## 2024-03-18 MED ORDER — IPRATROPIUM-ALBUTEROL 0.5-2.5 (3) MG/3ML IN SOLN
3.0000 mL | Freq: Once | RESPIRATORY_TRACT | Status: AC
  Administered 2024-03-18: 3 mL via RESPIRATORY_TRACT
  Filled 2024-03-18: qty 3

## 2024-03-18 MED ORDER — ONDANSETRON HCL 4 MG PO TABS: 4.0000 mg | ORAL_TABLET | Freq: Four times a day (QID) | ORAL | Status: DC | PRN

## 2024-03-18 MED ORDER — ALBUTEROL SULFATE (2.5 MG/3ML) 0.083% IN NEBU
5.0000 mg | INHALATION_SOLUTION | Freq: Once | RESPIRATORY_TRACT | Status: AC
  Administered 2024-03-18: 5 mg via RESPIRATORY_TRACT
  Filled 2024-03-18: qty 6

## 2024-03-18 MED ORDER — IPRATROPIUM-ALBUTEROL 0.5-2.5 (3) MG/3ML IN SOLN: 3.0000 mL | RESPIRATORY_TRACT | Status: DC | PRN

## 2024-03-18 MED ORDER — ACETAMINOPHEN 650 MG RE SUPP: 650.0000 mg | Freq: Four times a day (QID) | RECTAL | Status: DC | PRN

## 2024-03-18 MED ORDER — PREDNISONE 20 MG PO TABS: 40.0000 mg | ORAL_TABLET | Freq: Every day | ORAL | Status: DC

## 2024-03-18 MED ORDER — NICOTINE 14 MG/24HR TD PT24
14.0000 mg | MEDICATED_PATCH | Freq: Every day | TRANSDERMAL | Status: DC
  Administered 2024-03-18 – 2024-03-24 (×7): 14 mg via TRANSDERMAL
  Filled 2024-03-18 (×7): qty 1

## 2024-03-18 MED ORDER — POLYETHYLENE GLYCOL 3350 17 G PO PACK: 17.0000 g | PACK | Freq: Every day | ORAL | Status: DC | PRN

## 2024-03-18 MED ORDER — PREDNISONE 20 MG PO TABS
40.0000 mg | ORAL_TABLET | Freq: Every day | ORAL | Status: DC
  Administered 2024-03-19: 40 mg via ORAL
  Filled 2024-03-18: qty 2

## 2024-03-18 MED ORDER — LEVALBUTEROL HCL 0.63 MG/3ML IN NEBU
0.6300 mg | INHALATION_SOLUTION | Freq: Four times a day (QID) | RESPIRATORY_TRACT | Status: DC
  Administered 2024-03-18 – 2024-03-19 (×7): 0.63 mg via RESPIRATORY_TRACT
  Filled 2024-03-18 (×5): qty 3

## 2024-03-18 MED ORDER — METHYLPREDNISOLONE SODIUM SUCC 125 MG IJ SOLR
125.0000 mg | Freq: Two times a day (BID) | INTRAMUSCULAR | Status: AC
Start: 2024-03-18 — End: 2024-03-18
  Administered 2024-03-18 (×2): 125 mg via INTRAVENOUS
  Filled 2024-03-18: qty 2

## 2024-03-18 MED ORDER — PNEUMOCOCCAL 20-VAL CONJ VACC 0.5 ML IM SUSY
0.5000 mL | PREFILLED_SYRINGE | INTRAMUSCULAR | Status: AC
  Administered 2024-03-21: 0.5 mL via INTRAMUSCULAR
  Filled 2024-03-18 (×2): qty 0.5

## 2024-03-18 MED ORDER — GUAIFENESIN 100 MG/5ML PO LIQD
10.0000 mL | Freq: Three times a day (TID) | ORAL | Status: DC
  Administered 2024-03-18 – 2024-03-20 (×7): 10 mL via ORAL
  Filled 2024-03-18 (×7): qty 10

## 2024-03-18 MED ORDER — IPRATROPIUM BROMIDE 0.02 % IN SOLN
0.5000 mg | Freq: Four times a day (QID) | RESPIRATORY_TRACT | Status: DC
  Administered 2024-03-18 – 2024-03-19 (×7): 0.5 mg via RESPIRATORY_TRACT
  Filled 2024-03-18 (×6): qty 2.5

## 2024-03-18 MED ORDER — ROSUVASTATIN CALCIUM 10 MG PO TABS
10.0000 mg | ORAL_TABLET | Freq: Every day | ORAL | Status: DC
  Administered 2024-03-18 – 2024-03-24 (×7): 10 mg via ORAL
  Filled 2024-03-18 (×7): qty 1

## 2024-03-18 MED ORDER — METHYLPREDNISOLONE SODIUM SUCC 125 MG IJ SOLR
125.0000 mg | Freq: Two times a day (BID) | INTRAMUSCULAR | Status: DC
  Filled 2024-03-18: qty 2

## 2024-03-18 MED ORDER — SODIUM CHLORIDE 0.9 % IV SOLN
1.0000 g | INTRAVENOUS | Status: DC
  Administered 2024-03-18 – 2024-03-21 (×4): 1 g via INTRAVENOUS
  Filled 2024-03-18 (×4): qty 10

## 2024-03-18 MED ORDER — IPRATROPIUM BROMIDE 0.02 % IN SOLN
0.5000 mg | Freq: Once | RESPIRATORY_TRACT | Status: AC
  Administered 2024-03-18: 0.5 mg via RESPIRATORY_TRACT
  Filled 2024-03-18: qty 2.5

## 2024-03-18 MED ORDER — ASPIRIN 81 MG PO TBEC
81.0000 mg | DELAYED_RELEASE_TABLET | Freq: Every day | ORAL | Status: DC
  Administered 2024-03-18 – 2024-03-24 (×7): 81 mg via ORAL
  Filled 2024-03-18 (×7): qty 1

## 2024-03-18 MED ORDER — ONDANSETRON HCL 4 MG/2ML IJ SOLN: 4.0000 mg | Freq: Four times a day (QID) | INTRAMUSCULAR | Status: DC | PRN

## 2024-03-18 NOTE — Progress Notes (Signed)
 PROGRESS NOTE    Patient: Travis Lawrence                            PCP: Francenia Ingle, NP                    DOB: Feb 09, 1956            DOA: 03/17/2024 RUE:[id number]             DOS: 03/18/2024, 11:44 AM   LOS: 0 days   Date of Service: The patient was seen and examined on 03/18/2024  Subjective:   The patient was seen and examined this morning. Tachypneic, was requiring up to 4-3 L of oxygen, satting 96%, audibly wheezing, dry cough, Denied of having any chest pain.  Brief Narrative:    Travis Lawrence is a 68 y.o. male with medical history significant for advanced emphysema and schizophrenia who presents with shortness of breath.   Patient reports that he recently developed some sinus congestion and cough, and then worsening shortness of breath beginning roughly 4 days ago.  His shortness of breath and productive cough have steadily worsened while the upper respiratory symptoms have improved.  He denies chest pain, fever, or chills.   ED Course: Upon arrival to the ED, patient is found to be afebrile and saturating upper 80s on room air with normal HR and elevated BP.  Labs are most notable for sodium 131, normal creatinine, normal WBC, normal troponin, and negative respiratory virus panel.   CTA chest is negative for PE or acute airspace disease but notable for advanced emphysema.   Patient was placed on supplemental oxygen and treated with IV steroids and multiple short acting nebulized bronchodilators.    Assessment & Plan:   Principal Problem:   COPD with acute exacerbation (HCC) Active Problems:   Paranoid schizophrenia (HCC)   Acute respiratory failure with hypoxia (HCC)     COPD exacerbation; acute hypoxic respiratory failure  - Continue to have nurse of breath, wheezing at rest, worsening with exertion -Continues to require up to 4 L of oxygen, currently satting 99%, will taper down, O2 sat goal 88-92% -Continue DuoNeb bronchodilators, IV steroids with  taper -IV empiric antibiotics -Follow-up with cultures-sputum -   Schizophrenia  - Managed with Haldol decanoate injections       ------------------------------------------------------------------------------------------------------------------- Nutritional status:  The patient's BMI is: There is no height or weight on file to calculate BMI. I agree with the assessment and plan as outlined   ------------------------------------------------------------------------------------------------------------------------------------------------  DVT prophylaxis:  enoxaparin (LOVENOX) injection 40 mg Start: 03/18/24 1000   Code Status:   Code Status: Full Code  Family Communication: No family member present at bedside-  Discussed with patient -Advance care planning has been discussed.   Admission status:   Status is: Inpatient Remains inpatient appropriate because: On supplemental oxygen, needing  breathing treatment, IV steroids   Disposition: From  - home             Planning for discharge in 1-2  days: to Home   Procedures:   No admission procedures for hospital encounter.   Antimicrobials:  Anti-infectives (From admission, onward)    Start     Dose/Rate Route Frequency Ordered Stop   03/18/24 0300  cefTRIAXone  (ROCEPHIN ) 1 g in sodium chloride  0.9 % 100 mL IVPB        1 g 200 mL/hr over 30 Minutes Intravenous Every 24 hours  03/18/24 0218 03/23/24 0259        Medication:   aspirin EC  81 mg Oral Daily   budesonide (PULMICORT) nebulizer solution  0.25 mg Nebulization BID   enoxaparin (LOVENOX) injection  40 mg Subcutaneous Q24H   guaiFENesin  10 mL Oral Q8H   ipratropium  0.5 mg Nebulization Q6H   levalbuterol  0.63 mg Nebulization Q6H   methylPREDNISolone (SOLU-MEDROL) injection  125 mg Intravenous Q12H   Followed by   Cecily Cohen ON 03/19/2024] predniSONE  40 mg Oral Q breakfast   [START ON 03/19/2024] pneumococcal 20-valent conjugate vaccine  0.5 mL Intramuscular  Tomorrow-1000   rosuvastatin   10 mg Oral Daily    acetaminophen  **OR** acetaminophen , ondansetron  **OR** ondansetron  (ZOFRAN ) IV, polyethylene glycol   Objective:   Vitals:   03/18/24 0800 03/18/24 0819 03/18/24 0843 03/18/24 0846  BP: (!) 140/97     Pulse: 83 85    Resp: (!) 22     Temp: 97.9 F (36.6 C)     TempSrc: Oral     SpO2: 93% 94% 96% 96%    Intake/Output Summary (Last 24 hours) at 03/18/2024 1144 Last data filed at 03/18/2024 1025 Gross per 24 hour  Intake 313.6 ml  Output --  Net 313.6 ml   There were no vitals filed for this visit.   Physical examination:       General:  AAO x 3,  cooperative, no distress;   HEENT:  Normocephalic, PERRL, otherwise with in Normal limits   Neuro:  CNII-XII intact. , normal motor and sensation, reflexes intact   Lungs:   Diffuse wheezing, rhonchi, shortness of breath at rest, requiring up to 4 L of oxygen Dry cough, positive breath sounds in all 4 quadrants, mild diminished bilaterally lower lobes.  Cardio:    S1/S2, RRR, No murmure, No Rubs or Gallops   Abdomen:  Soft, non-tender, bowel sounds active all four quadrants, no guarding or peritoneal signs.  Muscular  skeletal:  Limited exam -global generalized weaknesses - in bed, able to move all 4 extremities,   2+ pulses,  symmetric, No pitting edema  Skin:  Dry, warm to touch, negative for any Rashes,  Wounds: Please see nursing documentation        --------------------------------------------------------------------------------------------------------    LABs:     Latest Ref Rng & Units 03/18/2024    5:44 AM 03/17/2024    9:09 PM 03/12/2024    3:36 PM  CBC  WBC 4.0 - 10.5 K/uL 5.6  8.5  7.8   Hemoglobin 13.0 - 17.0 g/dL 40.3  47.4  25.9   Hematocrit 39.0 - 52.0 % 47.9  50.0  49.2   Platelets 150 - 400 K/uL 213  254  272.0       Latest Ref Rng & Units 03/18/2024    5:44 AM 03/17/2024    9:09 PM 03/12/2024    3:36 PM  CMP  Glucose 70 - 99 mg/dL 563  875  79    BUN 8 - 23 mg/dL 8  8  10    Creatinine 0.61 - 1.24 mg/dL 6.43  3.29  5.18   Sodium 135 - 145 mmol/L 135  131  133   Potassium 3.5 - 5.1 mmol/L 4.1  3.9  4.0   Chloride 98 - 111 mmol/L 101  95  96   CO2 22 - 32 mmol/L 24  25  28    Calcium  8.9 - 10.3 mg/dL 9.2  8.9  9.5   Total Protein 6.0 -  8.3 g/dL   7.3   Total Bilirubin 0.2 - 1.2 mg/dL   0.9   Alkaline Phos 39 - 117 U/L   98   AST 0 - 37 U/L   16   ALT 0 - 53 U/L   10        Micro Results Recent Results (from the past 240 hours)  Resp panel by RT-PCR (RSV, Flu A&B, Covid) Anterior Nasal Swab     Status: None   Collection Time: 03/17/24 11:11 PM   Specimen: Anterior Nasal Swab  Result Value Ref Range Status   SARS Coronavirus 2 by RT PCR NEGATIVE NEGATIVE Final    Comment: (NOTE) SARS-CoV-2 target nucleic acids are NOT DETECTED.  The SARS-CoV-2 RNA is generally detectable in upper respiratory specimens during the acute phase of infection. The lowest concentration of SARS-CoV-2 viral copies this assay can detect is 138 copies/mL. A negative result does not preclude SARS-Cov-2 infection and should not be used as the sole basis for treatment or other patient management decisions. A negative result may occur with  improper specimen collection/handling, submission of specimen other than nasopharyngeal swab, presence of viral mutation(s) within the areas targeted by this assay, and inadequate number of viral copies(<138 copies/mL). A negative result must be combined with clinical observations, patient history, and epidemiological information. The expected result is Negative.  Fact Sheet for Patients:  BloggerCourse.com  Fact Sheet for Healthcare Providers:  SeriousBroker.it  This test is no t yet approved or cleared by the [place]  FDA and  has been authorized for detection and/or diagnosis of SARS-CoV-2 by FDA under an Emergency Use Authorization (EUA). This EUA will  remain  in effect (meaning this test can be used) for the duration of the COVID-19 declaration under Section 564(b)(1) of the Act, 21 U.S.C.section 360bbb-3(b)(1), unless the authorization is terminated  or revoked sooner.       Influenza A by PCR NEGATIVE NEGATIVE Final   Influenza B by PCR NEGATIVE NEGATIVE Final    Comment: (NOTE) The Xpert Xpress SARS-CoV-2/FLU/RSV plus assay is intended as an aid in the diagnosis of influenza from Nasopharyngeal swab specimens and should not be used as a sole basis for treatment. Nasal washings and aspirates are unacceptable for Xpert Xpress SARS-CoV-2/FLU/RSV testing.  Fact Sheet for Patients: BloggerCourse.com  Fact Sheet for Healthcare Providers: SeriousBroker.it  This test is not yet approved or cleared by the [place]  FDA and has been authorized for detection and/or diagnosis of SARS-CoV-2 by FDA under an Emergency Use Authorization (EUA). This EUA will remain in effect (meaning this test can be used) for the duration of the COVID-19 declaration under Section 564(b)(1) of the Act, 21 U.S.C. section 360bbb-3(b)(1), unless the authorization is terminated or revoked.     Resp Syncytial Virus by PCR NEGATIVE NEGATIVE Final    Comment: (NOTE) Fact Sheet for Patients: BloggerCourse.com  Fact Sheet for Healthcare Providers: SeriousBroker.it  This test is not yet approved or cleared by the [place]  FDA and has been authorized for detection and/or diagnosis of SARS-CoV-2 by FDA under an Emergency Use Authorization (EUA). This EUA will remain in effect (meaning this test can be used) for the duration of the COVID-19 declaration under Section 564(b)(1) of the Act, 21 U.S.C. section 360bbb-3(b)(1), unless the authorization is terminated or revoked.  Performed at [facility], [address], [place], [place]  [address]   Expectorated Sputum Assessment w Gram Stain, Rflx to Resp Cult     Status: None  Collection Time: 03/18/24  4:40 AM   Specimen: Sputum  Result Value Ref Range Status   Specimen Description SPUTUM  Final   Special Requests NONE  Final   Sputum evaluation   Final    THIS SPECIMEN IS ACCEPTABLE FOR SPUTUM CULTURE Performed at [facility], [address], [place], [place] [address]    Report Status 03/18/2024 FINAL  Final  Culture, Respiratory w Gram Stain     Status: None (Preliminary result)   Collection Time: 03/18/24  4:40 AM   Specimen: SPU  Result Value Ref Range Status   Specimen Description   Final    SPUTUM Performed at [facility], [address], [place], [place] [address]    Special Requests   Final    NONE Reflexed from [id number] [phone] Performed at [facility], [address] [place], [place] [address]    Gram Stain   Final    FEW WBC PRESENT, PREDOMINANTLY PMN MODERATE GRAM POSITIVE COCCI Performed at [facility], [address], [place], [place] [address]    Culture PENDING  Incomplete   Report Status PENDING  Incomplete    Radiology Reports CT Angio Chest PE W and/or Wo Contrast Result Date: 03/17/2024 CLINICAL DATA:  Shortness of breath EXAM: CT ANGIOGRAPHY CHEST WITH CONTRAST TECHNIQUE: Multidetector CT imaging of the chest was performed using the standard protocol during bolus administration of intravenous contrast. Multiplanar CT image reconstructions and MIPs were obtained to evaluate the vascular anatomy. RADIATION DOSE REDUCTION: This exam was performed according to the departmental dose-optimization program which includes automated exposure control, adjustment of the mA and/or kV according to patient size and/or use of iterative reconstruction technique. CONTRAST:  75mL OMNIPAQUE  IOHEXOL  350 MG/ML SOLN COMPARISON:  CT 02/01/2024 trauma 10/21/2023, PET CT 11/26/2023 FINDINGS: Cardiovascular:  Satisfactory opacification of the pulmonary arteries to the segmental level. No evidence of pulmonary embolism. Mild aortic atherosclerosis. No aneurysm. Coronary vascular calcification. Normal cardiac size. Trace pericardial effusion Mediastinum/Nodes: Patent trachea. No suspicious thyroid  mass. Borderline precarinal nodes measuring up to 10 mm. Esophagus within normal limits Lungs/Pleura: Advanced emphysema. No acute airspace disease, pleural effusion, or pneumothorax. Right upper lobe nodule appears stable and measures about 18 x 5 mm on series 12, image 43, adjacent fiducial marker. Right middle lobe 5 mm pulmonary nodule on series 12 image 102 also similar. Subpleural lingular nodule measures 7 mm on series 12, image 12, also unchanged. No new suspicious lung nodule Upper Abdomen: No acute finding Musculoskeletal: No acute or suspicious osseous abnormality. Multilevel degenerative changes Review of the MIP images confirms the above findings. IMPRESSION: 1. Negative for acute pulmonary embolus or aortic dissection. 2. Advanced emphysema. Stable bilateral pulmonary nodules. No acute airspace disease 3. Aortic atherosclerosis. Aortic Atherosclerosis (ICD10-I70.0) and Emphysema (ICD10-J43.9). Electronically Signed   By: Esmeralda Hedge M.D.   On: 03/17/2024 23:07   DG Chest 2 View Result Date: 03/17/2024 CLINICAL DATA:  Shortness of breath EXAM: CHEST - 2 VIEW COMPARISON:  Chest x-ray 12/17/2023.  Chest CT 02/01/2024 FINDINGS: Emphysematous changes are again seen. There is no lung consolidation, pleural effusion or pneumothorax. Questionable small nodular density seen in the left hilar region measuring 8 mm. The cardiomediastinal silhouette is within normal limits. No acute fractures are seen. IMPRESSION: 1. No active cardiopulmonary disease. 2. Emphysematous changes. 3. Questionable small nodular density in the left hilar region measuring 8 mm. Recommend further evaluation with nonemergent chest CT.  Electronically Signed   By: Amy  Naaman Au M.D.   On: 03/17/2024 21:34    SIGNED: Bobbetta Burnet, MD, FHM. FAAFP. Arlin Benes - [facility] Time spent - 55 min.  In seeing, evaluating and examining the patient. Reviewing medical records, labs, drawn plan of care. [facility],  Pager (please use [email or web address] to page/ text) Please use Epic Secure Chat for non-urgent communication (7AM-7PM)  If 7PM-7AM, please contact night-coverage [email or web address], 03/18/2024, 11:44 AM

## 2024-03-18 NOTE — Telephone Encounter (Signed)
 Copied from CRM [id number]. Topic: Clinical - Lab/Test Results >> Mar 18, 2024 10:32 AM Hilton Lucky wrote: Reason for CRM: Patient's brother is calling in to request CT Results, as patient is currently admitted in hospital. Please return call to patient if able.   I will route to Dara Ear, NP to advise on the CT results.

## 2024-03-18 NOTE — ED Provider Notes (Signed)
 [facility] AT [facility] Provider Note   CSN: [id number] Arrival date & time: 03/17/24  2039     History  Chief Complaint  Patient presents with   Shortness of Breath    Travis Lawrence is a 68 y.o. male here for shortness of.  History of COPD.  Yesterday developed cough congestion rhinorrhea.  Has been using inhaler at home without relief.  Does not use oxygen at home.  No fever, chest pain, back pain, abdominal pain, nausea, vomiting, lower extremity swelling. Family noticed that he was having a difficult time moving around due to shortness of breath with ambulation.  Following with pulm for pulmonary nodule.  No history of PE or DVT.  Denies any prior admissions for COPD.  Uses inhalers however no nebulizers at home.  HPI     Home Medications Prior to Admission medications   Medication Sig Start Date End Date Taking? Authorizing Provider  albuterol  (VENTOLIN  HFA) 108 (90 Base) MCG/ACT inhaler TAKE 2 PUFFS BY MOUTH EVERY 6 HOURS AS NEEDED FOR WHEEZE OR SHORTNESS OF BREATH 10/01/23   Burchette, Marijean Shouts, MD  aspirin EC 81 MG tablet Take 81 mg by mouth daily. Swallow whole.    [provider]  Fluticasone -Umeclidin-Vilant (TRELEGY ELLIPTA ) 100-62.5-25 MCG/ACT AEPB Inhale 1 puff into the lungs daily. 12/03/23   Raejean Bullock, NP  Fluticasone -Umeclidin-Vilant (TRELEGY ELLIPTA ) 100-62.5-25 MCG/ACT AEPB Inhale 1 puff into the lungs daily at 2 PM. 12/24/23   Raejean Bullock, NP  Fluticasone -Umeclidin-Vilant (TRELEGY ELLIPTA ) 100-62.5-25 MCG/ACT AEPB Inhale 1 puff into the lungs daily. 12/24/23   Raejean Bullock, NP  haloperidol decanoate (HALDOL DECANOATE) 100 MG/ML injection Inject 100 mg into the muscle every 28 (twenty-eight) days.    [provider]  Multiple Vitamins-Minerals (PRESERVISION AREDS 2) CAPS TAKE 1 CAPSULE BY MOUTH TWICE A DAY 11/02/23   Webb, Padonda B, FNP  rosuvastatin  (CRESTOR ) 10 MG tablet TAKE 1 TABLET BY MOUTH EVERYDAY AT  BEDTIME 02/03/24   Williamson, Joanna R, NP      Allergies    Penicillin g and Penicillins    Review of Systems   Review of Systems  Constitutional: Negative.   HENT:  Positive for congestion, postnasal drip and rhinorrhea. Negative for sore throat.   Respiratory:  Positive for cough, shortness of breath and wheezing.   Cardiovascular: Negative.   Gastrointestinal: Negative.   Genitourinary: Negative.   Musculoskeletal: Negative.   Skin: Negative.   Neurological: Negative.   All other systems reviewed and are negative.   Physical Exam Updated Vital Signs BP (!) 136/90   Pulse 85   Temp (!) 96.6 F (35.9 C) (Axillary)   Resp 18   SpO2 95%  Physical Exam Vitals and nursing note reviewed.  Constitutional:      General: He is not in acute distress.    Appearance: He is well-developed. He is ill-appearing. He is not toxic-appearing or diaphoretic.  HENT:     Head: Atraumatic.  Eyes:     Pupils: Pupils are equal, round, and reactive to light.  Cardiovascular:     Rate and Rhythm: Normal rate and regular rhythm.     Pulses: Normal pulses.          Radial pulses are 2+ on the right side and 2+ on the left side.       Posterior tibial pulses are 2+ on the right side and 2+ on the left side.     Heart sounds: Normal  heart sounds.  Pulmonary:     Effort: Respiratory distress present.     Breath sounds: Wheezing present.     Comments: Expiratory wheeze, speaks without difficulty.  Hypoxic, 86% on room air Chest:     Comments: Nontender Abdominal:     General: Bowel sounds are normal. There is no distension.     Palpations: Abdomen is soft.     Tenderness: There is no abdominal tenderness. There is no guarding or rebound.  Musculoskeletal:        General: Normal range of motion.     Cervical back: Normal range of motion and neck supple.     Right lower leg: No tenderness. No edema.     Left lower leg: No tenderness. No edema.     Comments: Nontender, full range of motion,  compartment soft. NO LE edema  Skin:    General: Skin is warm and dry.     Capillary Refill: Capillary refill takes less than 2 seconds.  Neurological:     General: No focal deficit present.     Mental Status: He is alert and oriented to person, place, and time.     ED Results / Procedures / Treatments   Labs (all labs ordered are listed, but only abnormal results are displayed) Labs Reviewed  BASIC METABOLIC PANEL WITH GFR - Abnormal; Notable for the following components:      Result Value   Sodium 131 (*)    Chloride 95 (*)    Glucose, Bld 115 (*)    All other components within normal limits  RESP PANEL BY RT-PCR (RSV, FLU A&B, COVID)  RVPGX2  EXPECTORATED SPUTUM ASSESSMENT W GRAM STAIN, RFLX TO RESP C  CBC  HIV ANTIBODY (ROUTINE TESTING W REFLEX)  BASIC METABOLIC PANEL WITH GFR  CBC  TROPONIN I (HIGH SENSITIVITY)    EKG None  Radiology CT Angio Chest PE W and/or Wo Contrast Result Date: 03/17/2024 CLINICAL DATA:  Shortness of breath EXAM: CT ANGIOGRAPHY CHEST WITH CONTRAST TECHNIQUE: Multidetector CT imaging of the chest was performed using the standard protocol during bolus administration of intravenous contrast. Multiplanar CT image reconstructions and MIPs were obtained to evaluate the vascular anatomy. RADIATION DOSE REDUCTION: This exam was performed according to the departmental dose-optimization program which includes automated exposure control, adjustment of the mA and/or kV according to patient size and/or use of iterative reconstruction technique. CONTRAST:  75mL OMNIPAQUE  IOHEXOL  350 MG/ML SOLN COMPARISON:  CT 02/01/2024 trauma 10/21/2023, PET CT 11/26/2023 FINDINGS: Cardiovascular: Satisfactory opacification of the pulmonary arteries to the segmental level. No evidence of pulmonary embolism. Mild aortic atherosclerosis. No aneurysm. Coronary vascular calcification. Normal cardiac size. Trace pericardial effusion Mediastinum/Nodes: Patent trachea. No suspicious thyroid   mass. Borderline precarinal nodes measuring up to 10 mm. Esophagus within normal limits Lungs/Pleura: Advanced emphysema. No acute airspace disease, pleural effusion, or pneumothorax. Right upper lobe nodule appears stable and measures about 18 x 5 mm on series 12, image 43, adjacent fiducial marker. Right middle lobe 5 mm pulmonary nodule on series 12 image 102 also similar. Subpleural lingular nodule measures 7 mm on series 12, image 12, also unchanged. No new suspicious lung nodule Upper Abdomen: No acute finding Musculoskeletal: No acute or suspicious osseous abnormality. Multilevel degenerative changes Review of the MIP images confirms the above findings. IMPRESSION: 1. Negative for acute pulmonary embolus or aortic dissection. 2. Advanced emphysema. Stable bilateral pulmonary nodules. No acute airspace disease 3. Aortic atherosclerosis. Aortic Atherosclerosis (ICD10-I70.0) and Emphysema (ICD10-J43.9). Electronically Signed  By: Esmeralda Hedge M.D.   On: 03/17/2024 23:07   DG Chest 2 View Result Date: 03/17/2024 CLINICAL DATA:  Shortness of breath EXAM: CHEST - 2 VIEW COMPARISON:  Chest x-ray 12/17/2023.  Chest CT 02/01/2024 FINDINGS: Emphysematous changes are again seen. There is no lung consolidation, pleural effusion or pneumothorax. Questionable small nodular density seen in the left hilar region measuring 8 mm. The cardiomediastinal silhouette is within normal limits. No acute fractures are seen. IMPRESSION: 1. No active cardiopulmonary disease. 2. Emphysematous changes. 3. Questionable small nodular density in the left hilar region measuring 8 mm. Recommend further evaluation with nonemergent chest CT. Electronically Signed   By: Tyron Gallon M.D.   On: 03/17/2024 21:34    Procedures .Critical Care  Performed by: Dickson Founds, PA-C Authorized by: Dickson Founds, PA-C   Critical care provider statement:    Critical care time (minutes):  35   Critical care was necessary to treat or  prevent imminent or life-threatening deterioration of the following conditions:  Respiratory failure   Critical care was time spent personally by me on the following activities:  Development of treatment plan with patient or surrogate, discussions with consultants, evaluation of patient's response to treatment, examination of patient, ordering and review of laboratory studies, ordering and review of radiographic studies, ordering and performing treatments and interventions, pulse oximetry, re-evaluation of patient's condition and review of old charts     Medications Ordered in ED Medications  albuterol  (PROVENTIL ) (2.5 MG/3ML) 0.083% nebulizer solution 5 mg (has no administration in time range)  ipratropium (ATROVENT) nebulizer solution 0.5 mg (has no administration in time range)  aspirin EC tablet 81 mg (has no administration in time range)  rosuvastatin  (CRESTOR ) tablet 10 mg (has no administration in time range)  enoxaparin (LOVENOX) injection 40 mg (has no administration in time range)  cefTRIAXone  (ROCEPHIN ) 1 g in sodium chloride  0.9 % 100 mL IVPB (has no administration in time range)  methylPREDNISolone sodium succinate (SOLU-MEDROL) 125 mg/2 mL injection 125 mg (has no administration in time range)    Followed by  predniSONE (DELTASONE) tablet 40 mg (has no administration in time range)  ipratropium-albuterol  (DUONEB) 0.5-2.5 (3) MG/3ML nebulizer solution 3 mL (has no administration in time range)  acetaminophen  (TYLENOL ) tablet 650 mg (has no administration in time range)    Or  acetaminophen  (TYLENOL ) suppository 650 mg (has no administration in time range)  polyethylene glycol (MIRALAX / GLYCOLAX) packet 17 g (has no administration in time range)  ondansetron  (ZOFRAN ) tablet 4 mg (has no administration in time range)    Or  ondansetron  (ZOFRAN ) injection 4 mg (has no administration in time range)  ipratropium-albuterol  (DUONEB) 0.5-2.5 (3) MG/3ML nebulizer solution 3 mL (has no  administration in time range)  guaiFENesin (ROBITUSSIN) 100 MG/5ML liquid 5 mL (has no administration in time range)  methylPREDNISolone sodium succinate (SOLU-MEDROL) 125 mg/2 mL injection 125 mg (125 mg Intravenous Given 03/17/24 2258)  ipratropium-albuterol  (DUONEB) 0.5-2.5 (3) MG/3ML nebulizer solution 3 mL (3 mLs Nebulization Given 03/17/24 2302)  iohexol  (OMNIPAQUE ) 350 MG/ML injection 75 mL (75 mLs Intravenous Contrast Given 03/17/24 2244)  ipratropium-albuterol  (DUONEB) 0.5-2.5 (3) MG/3ML nebulizer solution 3 mL (3 mLs Nebulization Given 03/18/24 0056)    ED Course/ Medical Decision Making/ A&P Clinical Course as of 03/18/24 0239  Wed Mar 18, 2024  0045 Patient reassessed.  Feels like his breathing has improved.  He is sitting on the edge of the bed with oxygen 85% on room air.  Still  has some mild expiratory wheeze.  Will give additional DuoNeb placed back on nasal cannula. [BH]  0215 Dr. Brice Campi with medicine for admission. [BH]    Clinical Course User Index [BH] Princesa Willig A, PA-C   68 year old here for evaluation of shortness of breath and hypoxia.  Developed some congestion, rhinorrhea cough and dyspnea on exertion about 2 days ago.  Came in today to the ED with concern for pneumonia.  He does have a history of emphysema.  Uses inhalers at home.  No nebulizers.  No prior admissions for COPD.  No chest pain.  He does have some diffuse expiratory wheeze, hypoxic into the mid 80s here.  Does not use any supplemental oxygen at home.  Follows with pulm for pulmonary nodule as well as his emphysema.  He does not appear grossly fluid overloaded.  No chest pain.  No history of PE or DVT.  Will give steroids, breathing treatment, labs and imaging and reassess.  Patient reassessed.  Symptoms improved.  Still hypoxic with some wheeze however work of breathing improved.   Chest x-ray does not show any pneumonia.  Will get CTA to ensure   labs and imaging personally viewed and  interpreted:  CBC without leukocytosis Metabolic panel sodium 131 Troponin 5 Viral panel negative Chest x-ray pulmonary nodule EKG without ischemic changes  Patient reassessed.  Still hypoxic however wheezing improved.  Down to 85% on room air.  Placed on 3 L nasal cannula with improvement to 92%.  Will admit for acute hypoxic respiratory failure likely due to COPD.  Will place additional DuoNeb.  Discussed with medicine team Dr. Brice Campi who is agreeable to evaluate patient for admission.  The patient appears reasonably stabilized for admission considering the current resources, flow, and capabilities available in the ED at this time, and I doubt any other [facility] requiring further screening and/or treatment in the ED prior to admission.   Considered: ACS, unstable angina, dissection, PE, pneumothorax, bacterial infectious process, trauma, sepsis, [place], CHF exacerbation, fluid overload, myocarditis, pericarditis, endocarditis however given history, exam and lab findings low suspicion for bowel                                Medical Decision Making Amount and/or Complexity of Data Reviewed Independent Historian:     Details: Family in room External Data Reviewed: labs, radiology, ECG and notes. Labs: ordered. Decision-making details documented in ED Course. Radiology: ordered and independent interpretation performed. Decision-making details documented in ED Course. ECG/medicine tests: ordered and independent interpretation performed. Decision-making details documented in ED Course.  Risk OTC drugs. Prescription drug management. Parenteral controlled substances. Decision regarding hospitalization. Diagnosis or treatment significantly limited by social determinants of health.           Final Clinical Impression(s) / ED Diagnoses Final diagnoses:  COPD exacerbation (HCC)  Acute respiratory failure with hypoxia [facility])    Rx / DC Orders ED Discharge Orders     None          Elowyn Raupp A, PA-C 03/18/24 0240    Rolinda Climes, DO 03/20/24 1459

## 2024-03-18 NOTE — Plan of Care (Signed)
  Problem: Education: Goal: Knowledge of General Education information will improve Description: Including pain rating scale, medication(s)/side effects and non-pharmacologic comfort measures Outcome: Progressing   Problem: Clinical Measurements: Goal: Respiratory complications will improve Outcome: Progressing   Problem: Nutrition: Goal: Adequate nutrition will be maintained Outcome: Progressing   Problem: Coping: Goal: Level of anxiety will decrease Outcome: Progressing   Problem: Elimination: Goal: Will not experience complications related to bowel motility Outcome: Progressing Goal: Will not experience complications related to urinary retention Outcome: Progressing   Problem: Safety: Goal: Ability to remain free from injury will improve Outcome: Progressing   Problem: Skin Integrity: Goal: Risk for impaired skin integrity will decrease Outcome: Progressing   Problem: Respiratory: Goal: Ability to maintain a clear airway will improve Outcome: Progressing

## 2024-03-18 NOTE — H&P (Signed)
 History and Physical    Travis Lawrence NWG:[id number] DOB: [birth year] DOA: 03/17/2024  PCP: Francenia Ingle, NP   Patient coming from: Home   Chief Complaint: SOB   HPI: Travis Lawrence is a 68 y.o. male with medical history significant for advanced emphysema and schizophrenia who presents with shortness of breath.  Patient reports that he recently developed some sinus congestion and cough, and then worsening shortness of breath beginning roughly 4 days ago.  His shortness of breath and productive cough have steadily worsened while the upper respiratory symptoms have improved.  He denies chest pain, fever, or chills.  ED Course: Upon arrival to the ED, patient is found to be afebrile and saturating upper 80s on room air with normal HR and elevated BP.  Labs are most notable for sodium 131, normal creatinine, normal WBC, normal troponin, and negative respiratory virus panel.  CTA chest is negative for PE or acute airspace disease but notable for advanced emphysema.  Patient was placed on supplemental oxygen and treated with IV steroids and multiple short acting nebulized bronchodilators.  Review of Systems:  All other systems reviewed and apart from HPI, are negative.  Past Medical History:  Diagnosis Date   Emphysema lung (HCC)    GERD (gastroesophageal reflux disease)    Hypertension    Hypertriglyceridemia    Schizophrenia (HCC)     Past Surgical History:  Procedure Laterality Date   BRONCHIAL BIOPSY  12/17/2023   Procedure: BRONCHIAL BIOPSIES;  Surgeon: Denson Flake, MD;  Location: [facility] ENDOSCOPY;  Service: Pulmonary;;   BRONCHIAL BRUSHINGS  12/17/2023   Procedure: BRONCHIAL BRUSHINGS;  Surgeon: Denson Flake, MD;  Location: [facility] ENDOSCOPY;  Service: Pulmonary;;   BRONCHIAL NEEDLE ASPIRATION BIOPSY  12/17/2023   Procedure: BRONCHIAL NEEDLE ASPIRATION BIOPSIES;  Surgeon: Denson Flake, MD;  Location: [facility] ENDOSCOPY;  Service: Pulmonary;;   BRONCHIAL WASHINGS  12/17/2023    Procedure: BRONCHIAL WASHINGS;  Surgeon: Denson Flake, MD;  Location: [facility] ENDOSCOPY;  Service: Pulmonary;;   CHOLECYSTECTOMY N/A 05/30/2015   Procedure: LAPAROSCOPIC SUBTOTAL CHOLECYSTECTOMY  ;  Surgeon: Aldean Hummingbird, MD;  Location: [facility] ORS;  Service: General;  Laterality: N/A;   FIDUCIAL MARKER PLACEMENT  12/17/2023   Procedure: FIDUCIAL MARKER PLACEMENT;  Surgeon: Denson Flake, MD;  Location: [facility] ENDOSCOPY;  Service: Pulmonary;;   TONSILLECTOMY     VIDEO BRONCHOSCOPY WITH RADIAL ENDOBRONCHIAL ULTRASOUND  12/17/2023   Procedure: VIDEO BRONCHOSCOPY WITH RADIAL ENDOBRONCHIAL ULTRASOUND;  Surgeon: Denson Flake, MD;  Location: [facility] ENDOSCOPY;  Service: Pulmonary;;   WISDOM TOOTH EXTRACTION      Social History:   reports that he has been smoking cigars. He has never used smokeless tobacco. He reports that he does not drink alcohol and does not use drugs.  Allergies  Allergen Reactions   Penicillin G Other (See Comments)   Penicillins Hives    Family History  Problem Relation Age of Onset   Esophageal cancer Mother    Lung cancer Father    Heart attack Father    Lung cancer Sister    COPD Sister    Heart attack Brother      Prior to Admission medications   Medication Sig Start Date End Date Taking? Authorizing Provider  albuterol  (VENTOLIN  HFA) 108 (90 Base) MCG/ACT inhaler TAKE 2 PUFFS BY MOUTH EVERY 6 HOURS AS NEEDED FOR WHEEZE OR SHORTNESS OF BREATH 10/01/23   Burchette, Marijean Shouts, MD  aspirin EC 81 MG tablet Take 81 mg by mouth  daily. Swallow whole.    [provider]  Fluticasone -Umeclidin-Vilant (TRELEGY ELLIPTA ) 100-62.5-25 MCG/ACT AEPB Inhale 1 puff into the lungs daily. 12/03/23   Raejean Bullock, NP  Fluticasone -Umeclidin-Vilant (TRELEGY ELLIPTA ) 100-62.5-25 MCG/ACT AEPB Inhale 1 puff into the lungs daily at 2 PM. 12/24/23   Raejean Bullock, NP  Fluticasone -Umeclidin-Vilant (TRELEGY ELLIPTA ) 100-62.5-25 MCG/ACT AEPB Inhale 1 puff into the lungs daily. 12/24/23   Raejean Bullock, NP  haloperidol decanoate (HALDOL DECANOATE) 100 MG/ML injection Inject 100 mg into the muscle every 28 (twenty-eight) days.    [provider]  Multiple Vitamins-Minerals (PRESERVISION AREDS 2) CAPS TAKE 1 CAPSULE BY MOUTH TWICE A DAY 11/02/23   Webb, Padonda B, FNP  rosuvastatin  (CRESTOR ) 10 MG tablet TAKE 1 TABLET BY MOUTH EVERYDAY AT BEDTIME 02/03/24   Francenia Ingle, NP    Physical Exam: Vitals:   03/17/24 2049 03/17/24 2050 03/17/24 2306 03/18/24 0207  BP: (!) 138/91  (!) 156/100 (!) 169/120  Pulse: 87  83 93  Resp: 18  18 18   Temp: 98.2 F (36.8 C)  (!) 96.6 F (35.9 C)   TempSrc: Oral  Axillary   SpO2: (!) 89% 91% 96% 97%    Constitutional: NAD, calm  Eyes: PERTLA, lids and conjunctivae normal ENMT: Mucous membranes are moist. Posterior pharynx clear of any exudate or lesions.   Neck: supple, no masses  Respiratory: Diminished bilaterally with prolonged expiratory phase. No accessory muscle use.  Cardiovascular: S1 & S2 heard, regular rate and rhythm. No extremity edema.   Abdomen: No distension, no tenderness, soft. Bowel sounds active.  Musculoskeletal: no clubbing / cyanosis. No joint deformity upper and lower extremities.   Skin: no significant rashes, lesions, ulcers. Warm, dry, well-perfused. Neurologic: CN 2-12 grossly intact. Moving all extremities. Alert and oriented.  Psychiatric: Pleasant. Cooperative.    Labs and Imaging on Admission: I have personally reviewed following labs and imaging studies  CBC: Recent Labs  Lab 03/12/24 1536 03/17/24 2109  WBC 7.8 8.5  NEUTROABS 4.8  --   HGB 16.6 16.8  HCT 49.2 50.0  MCV 95.7 96.5  PLT 272.0 254   Basic Metabolic Panel: Recent Labs  Lab 03/12/24 1536 03/17/24 2109  NA 133* 131*  K 4.0 3.9  CL 96 95*  CO2 28 25  GLUCOSE 79 115*  BUN 10 8  CREATININE 1.00 0.87  CALCIUM  9.5 8.9   GFR: Estimated Creatinine Clearance: 80 mL/min (by C-G formula based on SCr of 0.87 mg/dL). Liver  Function Tests: Recent Labs  Lab 03/12/24 1536  AST 16  ALT 10  ALKPHOS 98  BILITOT 0.9  PROT 7.3  ALBUMIN 4.3   No results for input(s): "LIPASE", "AMYLASE" in the last 168 hours. No results for input(s): "AMMONIA" in the last 168 hours. Coagulation Profile: No results for input(s): "INR", "PROTIME" in the last 168 hours. Cardiac Enzymes: No results for input(s): "CKTOTAL", "CKMB", "CKMBINDEX", "TROPONINI" in the last 168 hours. BNP (last 3 results) No results for input(s): "PROBNP" in the last 8760 hours. HbA1C: No results for input(s): "HGBA1C" in the last 72 hours. CBG: No results for input(s): "GLUCAP" in the last 168 hours. Lipid Profile: No results for input(s): "CHOL", "HDL", "LDLCALC", "TRIG", "CHOLHDL", "LDLDIRECT" in the last 72 hours. Thyroid  Function Tests: No results for input(s): "TSH", "T4TOTAL", "FREET4", "T3FREE", "THYROIDAB" in the last 72 hours. Anemia Panel: No results for input(s): "VITAMINB12", "FOLATE", "FERRITIN", "TIBC", "IRON", "RETICCTPCT" in the last 72 hours. Urine analysis:    Component  Value Date/Time   COLORURINE lt. yellow 01/11/2009 1508   LABSPEC <1.005 01/11/2009 1508   PHURINE 6.0 01/11/2009 1508   HGBUR trace-lysed 01/11/2009 1508   BILIRUBINUR Negative 05/26/2015 1411   PROTEINUR negative 05/26/2015 1411   UROBILINOGEN 0.2 05/26/2015 1411   UROBILINOGEN 0.2 01/11/2009 1508   NITRITE Negative 05/26/2015 1411   NITRITE negative 01/11/2009 1508   LEUKOCYTESUR Negative 05/26/2015 1411   Sepsis Labs: @LABRCNTIP (procalcitonin:4,lacticidven:4) ) Recent Results (from the past 240 hours)  Resp panel by RT-PCR (RSV, Flu A&B, Covid) Anterior Nasal Swab     Status: None   Collection Time: 03/17/24 11:11 PM   Specimen: Anterior Nasal Swab  Result Value Ref Range Status   SARS Coronavirus 2 by RT PCR NEGATIVE NEGATIVE Final    Comment: (NOTE) SARS-CoV-2 target nucleic acids are NOT DETECTED.  The SARS-CoV-2 RNA is generally detectable  in upper respiratory specimens during the acute phase of infection. The lowest concentration of SARS-CoV-2 viral copies this assay can detect is 138 copies/mL. A negative result does not preclude SARS-Cov-2 infection and should not be used as the sole basis for treatment or other patient management decisions. A negative result may occur with  improper specimen collection/handling, submission of specimen other than nasopharyngeal swab, presence of viral mutation(s) within the areas targeted by this assay, and inadequate number of viral copies(<138 copies/mL). A negative result must be combined with clinical observations, patient history, and epidemiological information. The expected result is Negative.  Fact Sheet for Patients:  BloggerCourse.com  Fact Sheet for Healthcare Providers:  SeriousBroker.it  This test is no t yet approved or cleared by the [place]  FDA and  has been authorized for detection and/or diagnosis of SARS-CoV-2 by FDA under an Emergency Use Authorization (EUA). This EUA will remain  in effect (meaning this test can be used) for the duration of the COVID-19 declaration under Section 564(b)(1) of the Act, 21 U.S.C.section 360bbb-3(b)(1), unless the authorization is terminated  or revoked sooner.       Influenza A by PCR NEGATIVE NEGATIVE Final   Influenza B by PCR NEGATIVE NEGATIVE Final    Comment: (NOTE) The Xpert Xpress SARS-CoV-2/FLU/RSV plus assay is intended as an aid in the diagnosis of influenza from Nasopharyngeal swab specimens and should not be used as a sole basis for treatment. Nasal washings and aspirates are unacceptable for Xpert Xpress SARS-CoV-2/FLU/RSV testing.  Fact Sheet for Patients: BloggerCourse.com  Fact Sheet for Healthcare Providers: SeriousBroker.it  This test is not yet approved or cleared by the [place]  FDA and has  been authorized for detection and/or diagnosis of SARS-CoV-2 by FDA under an Emergency Use Authorization (EUA). This EUA will remain in effect (meaning this test can be used) for the duration of the COVID-19 declaration under Section 564(b)(1) of the Act, 21 U.S.C. section 360bbb-3(b)(1), unless the authorization is terminated or revoked.     Resp Syncytial Virus by PCR NEGATIVE NEGATIVE Final    Comment: (NOTE) Fact Sheet for Patients: BloggerCourse.com  Fact Sheet for Healthcare Providers: SeriousBroker.it  This test is not yet approved or cleared by the [place]  FDA and has been authorized for detection and/or diagnosis of SARS-CoV-2 by FDA under an Emergency Use Authorization (EUA). This EUA will remain in effect (meaning this test can be used) for the duration of the COVID-19 declaration under Section 564(b)(1) of the Act, 21 U.S.C. section 360bbb-3(b)(1), unless the authorization is terminated or revoked.  Performed at [facility], [address] [place], [place]  [address]      Radiological Exams on Admission: CT Angio Chest PE W and/or Wo Contrast Result Date: 03/17/2024 CLINICAL DATA:  Shortness of breath EXAM: CT ANGIOGRAPHY CHEST WITH CONTRAST TECHNIQUE: Multidetector CT imaging of the chest was performed using the standard protocol during bolus administration of intravenous contrast. Multiplanar CT image reconstructions and MIPs were obtained to evaluate the vascular anatomy. RADIATION DOSE REDUCTION: This exam was performed according to the departmental dose-optimization program which includes automated exposure control, adjustment of the mA and/or kV according to patient size and/or use of iterative reconstruction technique. CONTRAST:  75mL OMNIPAQUE  IOHEXOL  350 MG/ML SOLN COMPARISON:  CT 02/01/2024 trauma 10/21/2023, PET CT 11/26/2023 FINDINGS: Cardiovascular: Satisfactory opacification of the  pulmonary arteries to the segmental level. No evidence of pulmonary embolism. Mild aortic atherosclerosis. No aneurysm. Coronary vascular calcification. Normal cardiac size. Trace pericardial effusion Mediastinum/Nodes: Patent trachea. No suspicious thyroid  mass. Borderline precarinal nodes measuring up to 10 mm. Esophagus within normal limits Lungs/Pleura: Advanced emphysema. No acute airspace disease, pleural effusion, or pneumothorax. Right upper lobe nodule appears stable and measures about 18 x 5 mm on series 12, image 43, adjacent fiducial marker. Right middle lobe 5 mm pulmonary nodule on series 12 image 102 also similar. Subpleural lingular nodule measures 7 mm on series 12, image 12, also unchanged. No new suspicious lung nodule Upper Abdomen: No acute finding Musculoskeletal: No acute or suspicious osseous abnormality. Multilevel degenerative changes Review of the MIP images confirms the above findings. IMPRESSION: 1. Negative for acute pulmonary embolus or aortic dissection. 2. Advanced emphysema. Stable bilateral pulmonary nodules. No acute airspace disease 3. Aortic atherosclerosis. Aortic Atherosclerosis (ICD10-I70.0) and Emphysema (ICD10-J43.9). Electronically Signed   By: Esmeralda Hedge M.D.   On: 03/17/2024 23:07   DG Chest 2 View Result Date: 03/17/2024 CLINICAL DATA:  Shortness of breath EXAM: CHEST - 2 VIEW COMPARISON:  Chest x-ray 12/17/2023.  Chest CT 02/01/2024 FINDINGS: Emphysematous changes are again seen. There is no lung consolidation, pleural effusion or pneumothorax. Questionable small nodular density seen in the left hilar region measuring 8 mm. The cardiomediastinal silhouette is within normal limits. No acute fractures are seen. IMPRESSION: 1. No active cardiopulmonary disease. 2. Emphysematous changes. 3. Questionable small nodular density in the left hilar region measuring 8 mm. Recommend further evaluation with nonemergent chest CT. Electronically Signed   By: Tyron Gallon M.D.    On: 03/17/2024 21:34    EKG: Independently reviewed. Sinus rhythm, RBBB, LAFB.   Assessment/Plan   1. COPD exacerbation; acute hypoxic respiratory failure  - Culture sputum, continue systemic steroids, start antibiotic, continue short-acting bronchodilators, continue supplemental O2 as-needed    2. Schizophrenia  - Managed with Haldol decanoate injections    DVT prophylaxis: Lovenox  Code Status: Full  Level of Care: Level of care: Telemetry Family Communication: None present   Disposition Plan:  Patient is from: Home  Anticipated d/c is to: home  Anticipated d/c date is: 03/21/24 Patient currently: Pending improved respiratory status  Consults called: None  Admission status: Inpatient    Walton Guppy, MD [facility]  03/18/2024, 2:18 AM

## 2024-03-18 NOTE — Progress Notes (Signed)
 Mobility Specialist - Progress Note   03/18/24 1345  Mobility  Activity Ambulated with assistance in hallway  Level of Assistance Standby assist, set-up cues, supervision of patient - no hands on  Assistive Device Front wheel walker  Distance Ambulated (ft) 20 ft  Activity Response Tolerated fair  Mobility Referral Yes  Mobility visit 1 Mobility  Mobility Specialist Start Time (ACUTE ONLY) 1335  Mobility Specialist Stop Time (ACUTE ONLY) 1343  Mobility Specialist Time Calculation (min) (ACUTE ONLY) 8 min   Pt received in bed and agreeable to mobility. Further ambulation halted d/t SOB. Unable to check SpO2 at time of session. SOB throughout session. No complaints during session. Pt to bench after session with all needs met.    [facility]

## 2024-03-18 NOTE — Progress Notes (Signed)
 Pt currently requiring 3L of O2. TOC will follow for possible O2 need at discharge.     03/18/24 1220  TOC Brief Assessment  Insurance and Status Reviewed  Patient has primary care physician Yes  Home environment has been reviewed Single family home  Prior level of function: Independent  Prior/Current Home Services No current home services  Social Drivers of Health Review SDOH reviewed no interventions necessary  Readmission risk has been reviewed Yes  Transition of care needs transition of care needs identified, TOC will continue to follow

## 2024-03-18 NOTE — Hospital Course (Addendum)
 Travis Lawrence is a 68 y.o. male with medical history significant for advanced emphysema and schizophrenia who presents with shortness of breath.   Patient reports that he recently developed some sinus congestion and cough, and then worsening shortness of breath beginning roughly 4 days ago.  His shortness of breath and productive cough have steadily worsened while the upper respiratory symptoms have improved.  He denies chest pain, fever, or chills.   ED Course: Upon arrival to the ED, patient is found to be afebrile and saturating upper 80s on room air with normal HR and elevated BP.  Labs are most notable for sodium 131, normal creatinine, normal WBC, normal troponin, and negative respiratory virus panel.   CTA chest is negative for PE or acute airspace disease but notable for advanced emphysema.   Patient was placed on supplemental oxygen and treated with IV steroids and multiple short acting nebulized bronchodilators.    Assessment & Plan:   Principal Problem:   COPD with acute exacerbation (HCC) Active Problems:   Paranoid schizophrenia (HCC)   Acute respiratory failure with hypoxia (HCC)     COPD exacerbation; acute hypoxic respiratory failure  - Persistent hypoxic respiratory failure, with minimal exertion even on 3 L desatted to 85%  - Respiratory therapist, continue bronchodilator breathing treatment with Atrovent/Xopenex every 4 hours, continue Pulmicort nebs, C  - Will continue high-dose IV steroids 80 >>>> 40 mg t will taper down slowly -Trial of diuretics x 1   -Goal O2 goal O2 sat 88-92% - Clear the patient needs home O2-will be arranged  -IV empiric antibiotics Rocephin , will switch to p.o. Levaquin      Schizophrenia  Mood stable, - Managed with Haldol decanoate injections   Hypertension -Improved BP on Norvasc -Not on any antihypertensive medications - 5/23 started Norvasc -Utilizing as needed hydralazine  Hyperlipidemia - continue statins  Chronic  tobacco abuse - Measures of quitting tobacco has been discussed with patient in detail - Accepted NicoDerm patch

## 2024-03-18 NOTE — Plan of Care (Signed)

## 2024-03-19 DIAGNOSIS — J441 Chronic obstructive pulmonary disease with (acute) exacerbation: Secondary | ICD-10-CM | POA: Diagnosis not present

## 2024-03-19 LAB — BASIC METABOLIC PANEL WITH GFR
Anion gap: 6 (ref 5–15)
BUN: 14 mg/dL (ref 8–23)
CO2: 28 mmol/L (ref 22–32)
Calcium: 8.8 mg/dL — ABNORMAL LOW (ref 8.9–10.3)
Chloride: 98 mmol/L (ref 98–111)
Creatinine, Ser: 0.85 mg/dL (ref 0.61–1.24)
GFR, Estimated: >60 mL/min (ref >60)
Glucose, Bld: 152 mg/dL — ABNORMAL HIGH (ref 70–99)
Potassium: 4.4 mmol/L (ref 3.5–5.1)
Sodium: 132 mmol/L — ABNORMAL LOW (ref 135–145)

## 2024-03-19 MED ORDER — LEVALBUTEROL HCL 0.63 MG/3ML IN NEBU
0.6300 mg | INHALATION_SOLUTION | Freq: Three times a day (TID) | RESPIRATORY_TRACT | Status: DC
  Administered 2024-03-20 – 2024-03-22 (×7): 0.63 mg via RESPIRATORY_TRACT
  Filled 2024-03-19 (×7): qty 3

## 2024-03-19 MED ORDER — IPRATROPIUM BROMIDE 0.02 % IN SOLN
0.5000 mg | Freq: Three times a day (TID) | RESPIRATORY_TRACT | Status: DC
  Administered 2024-03-20 – 2024-03-22 (×7): 0.5 mg via RESPIRATORY_TRACT
  Filled 2024-03-19 (×7): qty 2.5

## 2024-03-19 NOTE — Progress Notes (Signed)
 PROGRESS NOTE    Patient: Travis Lawrence                            PCP: Francenia Ingle, NP                    DOB: 03-30-1956            DOA: 03/17/2024 ZOX:[id number]             DOS: 03/19/2024, 12:04 PM   LOS: 1 day   Date of Service: The patient was seen and examined on 03/19/2024  Subjective:   The patient was seen and examined this morning, stable, complaining shortness of breath, on room air desatted back to 77%, was placed back on 3 L of oxygen O2 sat improved to 91%. Mildly tachypneic with respirate of 22  Brief Narrative:    Travis Lawrence is a 68 y.o. male with medical history significant for advanced emphysema and schizophrenia who presents with shortness of breath.   Patient reports that he recently developed some sinus congestion and cough, and then worsening shortness of breath beginning roughly 4 days ago.  His shortness of breath and productive cough have steadily worsened while the upper respiratory symptoms have improved.  He denies chest pain, fever, or chills.   ED Course: Upon arrival to the ED, patient is found to be afebrile and saturating upper 80s on room air with normal HR and elevated BP.  Labs are most notable for sodium 131, normal creatinine, normal WBC, normal troponin, and negative respiratory virus panel.   CTA chest is negative for PE or acute airspace disease but notable for advanced emphysema.   Patient was placed on supplemental oxygen and treated with IV steroids and multiple short acting nebulized bronchodilators.    Assessment & Plan:   Principal Problem:   COPD with acute exacerbation (HCC) Active Problems:   Paranoid schizophrenia (HCC)   Acute respiratory failure with hypoxia (HCC)     COPD exacerbation; acute hypoxic respiratory failure  - Continue to complain shortness of breath, desatted to 77% on room air, placed back on 3 L of oxygen, currently satting 91% -To demand down from 4 to 3 L.  -Continue to monitor closely,  continue O2 supplements, with plan to wean off, O2 sat goal 88-92% -Continue DuoNeb bronchodilators, IV steroids with taper -IV empiric antibiotics -Follow-up with cultures-sputum -   Schizophrenia  - Managed with Haldol decanoate injections       ------------------------------------------------------------------------------------------------------------------- Nutritional status:  The patient's BMI is: There is no height or weight on file to calculate BMI. I agree with the assessment and plan as outlined   ------------------------------------------------------------------------------------------------------------------------------------------------  DVT prophylaxis:  enoxaparin (LOVENOX) injection 40 mg Start: 03/18/24 1000   Code Status:   Code Status: Full Code  Family Communication: No family member present at bedside-  Discussed with patient -Advance care planning has been discussed.   Admission status:   Status is: Inpatient Remains inpatient appropriate because: On supplemental oxygen, needing  breathing treatment, IV steroids   Disposition: From  - home             Planning for discharge in 1-2  days: to Home   Procedures:   No admission procedures for hospital encounter.   Antimicrobials:  Anti-infectives (From admission, onward)    Start     Dose/Rate Route Frequency Ordered Stop   03/18/24 0300  cefTRIAXone  (ROCEPHIN ) 1 g  in sodium chloride  0.9 % 100 mL IVPB        1 g 200 mL/hr over 30 Minutes Intravenous Every 24 hours 03/18/24 0218 03/23/24 0259        Medication:   aspirin  EC  81 mg Oral Daily   budesonide  (PULMICORT ) nebulizer solution  0.25 mg Nebulization BID   enoxaparin  (LOVENOX ) injection  40 mg Subcutaneous Q24H   guaiFENesin   10 mL Oral Q8H   ipratropium  0.5 mg Nebulization Q6H   levalbuterol   0.63 mg Nebulization Q6H   nicotine   14 mg Transdermal Daily   pneumococcal 20-valent conjugate vaccine  0.5 mL Intramuscular Tomorrow-1000    predniSONE   40 mg Oral Q breakfast   rosuvastatin   10 mg Oral Daily    acetaminophen  **OR** acetaminophen , ondansetron  **OR** ondansetron  (ZOFRAN ) IV, polyethylene glycol   Objective:   Vitals:   03/19/24 0735 03/19/24 1008 03/19/24 1123 03/19/24 1128  BP:   (!) 146/95   Pulse:   80   Resp:   (!) 22   Temp:   97.9 F (36.6 C)   TempSrc:   Oral   SpO2: 92% (!) 77% 93% 91%    Intake/Output Summary (Last 24 hours) at 03/19/2024 1204 Last data filed at 03/18/2024 1500 Gross per 24 hour  Intake 26.44 ml  Output --  Net 26.44 ml   There were no vitals filed for this visit.   Physical examination:         General:  AAO x 3,  cooperative, in respiratory distress  HEENT:  Normocephalic, PERRL, otherwise with in Normal limits   Neuro:  CNII-XII intact. , normal motor and sensation, reflexes intact   Lungs:   Positive breath sounds in all 4 to mid lobes, diminished in lower lobes, diffuse wheezing and rhonchi negative any crackles  Cardio:    S1/S2, RRR, No murmure, No Rubs or Gallops   Abdomen:  Soft, non-tender, bowel sounds active all four quadrants, no guarding or peritoneal signs.  Muscular  skeletal:  Limited exam -global generalized weaknesses - in bed, able to move all 4 extremities,   2+ pulses,  symmetric, No pitting edema  Skin:  Dry, warm to touch, negative for any Rashes,  Wounds: Please see nursing documentation            --------------------------------------------------------------------------------------------------------    LABs:     Latest Ref Rng & Units 03/18/2024    5:44 AM 03/17/2024    9:09 PM 03/12/2024    3:36 PM  CBC  WBC 4.0 - 10.5 K/uL 5.6  8.5  7.8   Hemoglobin 13.0 - 17.0 g/dL 16.1  09.6  04.5   Hematocrit 39.0 - 52.0 % 47.9  50.0  49.2   Platelets 150 - 400 K/uL 213  254  272.0       Latest Ref Rng & Units 03/19/2024    5:51 AM 03/18/2024    5:44 AM 03/17/2024    9:09 PM  CMP  Glucose 70 - 99 mg/dL 409  811  914   BUN 8 -  23 mg/dL 14  8  8    Creatinine 0.61 - 1.24 mg/dL 7.82  9.56  2.13   Sodium 135 - 145 mmol/L 132  135  131   Potassium 3.5 - 5.1 mmol/L 4.4  4.1  3.9   Chloride 98 - 111 mmol/L 98  101  95   CO2 22 - 32 mmol/L 28  24  25    Calcium  8.9 - 10.3  mg/dL 8.8  9.2  8.9        Micro Results Recent Results (from the past 240 hours)  Resp panel by RT-PCR (RSV, Flu A&B, Covid) Anterior Nasal Swab     Status: None   Collection Time: 03/17/24 11:11 PM   Specimen: Anterior Nasal Swab  Result Value Ref Range Status   SARS Coronavirus 2 by RT PCR NEGATIVE NEGATIVE Final    Comment: (NOTE) SARS-CoV-2 target nucleic acids are NOT DETECTED.  The SARS-CoV-2 RNA is generally detectable in upper respiratory specimens during the acute phase of infection. The lowest concentration of SARS-CoV-2 viral copies this assay can detect is 138 copies/mL. A negative result does not preclude SARS-Cov-2 infection and should not be used as the sole basis for treatment or other patient management decisions. A negative result may occur with  improper specimen collection/handling, submission of specimen other than nasopharyngeal swab, presence of viral mutation(s) within the areas targeted by this assay, and inadequate number of viral copies(<138 copies/mL). A negative result must be combined with clinical observations, patient history, and epidemiological information. The expected result is Negative.  Fact Sheet for Patients:  BloggerCourse.com  Fact Sheet for Healthcare Providers:  SeriousBroker.it  This test is no t yet approved or cleared by the [place]  FDA and  has been authorized for detection and/or diagnosis of SARS-CoV-2 by FDA under an Emergency Use Authorization (EUA). This EUA will remain  in effect (meaning this test can be used) for the duration of the COVID-19 declaration under Section 564(b)(1) of the Act, 21 U.S.C.section 360bbb-3(b)(1),  unless the authorization is terminated  or revoked sooner.       Influenza A by PCR NEGATIVE NEGATIVE Final   Influenza B by PCR NEGATIVE NEGATIVE Final    Comment: (NOTE) The Xpert Xpress SARS-CoV-2/FLU/RSV plus assay is intended as an aid in the diagnosis of influenza from Nasopharyngeal swab specimens and should not be used as a sole basis for treatment. Nasal washings and aspirates are unacceptable for Xpert Xpress SARS-CoV-2/FLU/RSV testing.  Fact Sheet for Patients: BloggerCourse.com  Fact Sheet for Healthcare Providers: SeriousBroker.it  This test is not yet approved or cleared by the [place]  FDA and has been authorized for detection and/or diagnosis of SARS-CoV-2 by FDA under an Emergency Use Authorization (EUA). This EUA will remain in effect (meaning this test can be used) for the duration of the COVID-19 declaration under Section 564(b)(1) of the Act, 21 U.S.C. section 360bbb-3(b)(1), unless the authorization is terminated or revoked.     Resp Syncytial Virus by PCR NEGATIVE NEGATIVE Final    Comment: (NOTE) Fact Sheet for Patients: BloggerCourse.com  Fact Sheet for Healthcare Providers: SeriousBroker.it  This test is not yet approved or cleared by the [place]  FDA and has been authorized for detection and/or diagnosis of SARS-CoV-2 by FDA under an Emergency Use Authorization (EUA). This EUA will remain in effect (meaning this test can be used) for the duration of the COVID-19 declaration under Section 564(b)(1) of the Act, 21 U.S.C. section 360bbb-3(b)(1), unless the authorization is terminated or revoked.  Performed at [facility], [address], [place], [place] [address]   Expectorated Sputum Assessment w Gram Stain, Rflx to Resp Cult     Status: None   Collection Time: 03/18/24  4:40 AM   Specimen: Sputum  Result Value Ref  Range Status   Specimen Description SPUTUM  Final   Special Requests NONE  Final   Sputum evaluation   Final  THIS SPECIMEN IS ACCEPTABLE FOR SPUTUM CULTURE Performed at [facility], [address], [place], [place] [address]    Report Status 03/18/2024 FINAL  Final  Culture, Respiratory w Gram Stain     Status: None (Preliminary result)   Collection Time: 03/18/24  4:40 AM   Specimen: SPU  Result Value Ref Range Status   Specimen Description   Final    SPUTUM Performed at [facility], [address] [place], [place] [address]    Special Requests   Final    NONE Reflexed from [id number] Performed at [facility], [address] [place], [place] [address]    Gram Stain   Final    FEW WBC PRESENT, PREDOMINANTLY PMN MODERATE GRAM POSITIVE COCCI    Culture   Final    CULTURE REINCUBATED FOR BETTER GROWTH Performed at [facility], [address], [place], [place] [address]    Report Status PENDING  Incomplete    Radiology Reports No results found.   SIGNED: Bobbetta Burnet, MD, FHM. FAAFP. Arlin Benes - [facility] Time spent - 55 min.  In seeing, evaluating and examining the patient. Reviewing medical records, labs, drawn plan of care. [facility],  Pager (please use [email or web address] to page/ text) Please use Epic Secure Chat for non-urgent communication (7AM-7PM)  If 7PM-7AM, please contact night-coverage [email or web address], 03/19/2024, 12:04 PM

## 2024-03-19 NOTE — Progress Notes (Signed)
 Mobility Specialist - Progress Note   03/19/24 1008  Oxygen Therapy  SpO2 (!) 77 %  O2 Device Room Air  Patient Activity (if Appropriate) Ambulating  Mobility  Activity Ambulated with assistance in hallway  Level of Assistance Standby assist, set-up cues, supervision of patient - no hands on  Assistive Device Front wheel walker  Distance Ambulated (ft) 40 ft  Activity Response Tolerated well  Mobility Referral Yes  Mobility visit 1 Mobility  Mobility Specialist Start Time (ACUTE ONLY) 0955  Mobility Specialist Stop Time (ACUTE ONLY) 1007  Mobility Specialist Time Calculation (min) (ACUTE ONLY) 12 min   Pt received EOB and agreeable to mobility. Pt stated that MD stated that he did not need supplemental SpO2 during ambulation. Pt desat to 77%, requiring a seated rest break. Pt wheeled back into room with help of nursing staff. SpO2 back to 91%. No complaints during session. Pt to bed after session with all needs met. RN in room.  Pt to bed with all needs met.   During mobility: 77% SpO2 (RA) Post-mobility: 91% SPO2  Chief Technology Officer

## 2024-03-19 NOTE — Plan of Care (Signed)

## 2024-03-19 NOTE — Progress Notes (Signed)
 SATURATION QUALIFICATIONS: (This note is used to comply with regulatory documentation for home oxygen)  Patient Saturations on Room Air at Rest = 90 %  Patient Saturations on Room Air while Ambulating = 76 %  Patient Saturations on 4 Liters of oxygen while Ambulating = 89 %  Please briefly explain why patient needs home oxygen: The patient ambulated approximately 28 feet when he experienced worsened  dyspnea and became diaphoretic. Saturation decreased initially to 72 %, improving slightly 76%. Oxygen was applied saturation increased 89% on 5 lt while standing. He sat in chair in the hallway and was rolled back to his room.

## 2024-03-20 ENCOUNTER — Telehealth: Payer: Self-pay | Admitting: *Deleted

## 2024-03-20 DIAGNOSIS — J441 Chronic obstructive pulmonary disease with (acute) exacerbation: Secondary | ICD-10-CM | POA: Diagnosis not present

## 2024-03-20 LAB — CULTURE, RESPIRATORY W GRAM STAIN

## 2024-03-20 LAB — BASIC METABOLIC PANEL WITH GFR
Anion gap: 7 (ref 5–15)
BUN: 20 mg/dL (ref 8–23)
CO2: 28 mmol/L (ref 22–32)
Calcium: 8.5 mg/dL — ABNORMAL LOW (ref 8.9–10.3)
Chloride: 100 mmol/L (ref 98–111)
Creatinine, Ser: 0.82 mg/dL (ref 0.61–1.24)
GFR, Estimated: >60 mL/min (ref >60)
Glucose, Bld: 101 mg/dL — ABNORMAL HIGH (ref 70–99)
Potassium: 4.2 mmol/L (ref 3.5–5.1)
Sodium: 135 mmol/L (ref 135–145)

## 2024-03-20 MED ORDER — GUAIFENESIN 100 MG/5ML PO LIQD
15.0000 mL | Freq: Three times a day (TID) | ORAL | Status: DC
  Administered 2024-03-20 – 2024-03-24 (×13): 15 mL via ORAL
  Filled 2024-03-20 (×13): qty 20

## 2024-03-20 MED ORDER — AMLODIPINE BESYLATE 10 MG PO TABS
10.0000 mg | ORAL_TABLET | Freq: Every day | ORAL | Status: DC
  Administered 2024-03-21 – 2024-03-24 (×4): 10 mg via ORAL
  Filled 2024-03-20 (×4): qty 1

## 2024-03-20 MED ORDER — BUDESONIDE 0.5 MG/2ML IN SUSP
0.5000 mg | Freq: Two times a day (BID) | RESPIRATORY_TRACT | Status: DC
  Administered 2024-03-20 – 2024-03-24 (×9): 0.5 mg via RESPIRATORY_TRACT
  Filled 2024-03-20 (×9): qty 2

## 2024-03-20 MED ORDER — AMLODIPINE BESYLATE 5 MG PO TABS
5.0000 mg | ORAL_TABLET | Freq: Every day | ORAL | Status: DC
  Administered 2024-03-20: 5 mg via ORAL
  Filled 2024-03-20: qty 1

## 2024-03-20 MED ORDER — METHYLPREDNISOLONE SODIUM SUCC 125 MG IJ SOLR
80.0000 mg | Freq: Two times a day (BID) | INTRAMUSCULAR | Status: DC
Start: 2024-03-20 — End: 2024-03-21
  Administered 2024-03-20 – 2024-03-21 (×3): 80 mg via INTRAVENOUS
  Filled 2024-03-20 (×3): qty 2

## 2024-03-20 NOTE — Progress Notes (Signed)
 PROGRESS NOTE    Patient: Travis Lawrence                            PCP: Francenia Ingle, NP                    DOB: 02-May-1956            DOA: 03/17/2024 ZOX:[id number]             DOS: 03/20/2024, 1:28 PM   LOS: 2 days   Date of Service: The patient was seen and examined on 03/20/2024  Subjective:   The patient was seen and examined this morning, laying in bed comfortably, complaining of shortness of open minimal exertion, mildly tachypneic, was satting 96% on 4 L of oxygen, on trial of oxygen desatted to 87%  Brief Narrative:    YOBANY VROOM is a 68 y.o. male with medical history significant for advanced emphysema and schizophrenia who presents with shortness of breath.   Patient reports that he recently developed some sinus congestion and cough, and then worsening shortness of breath beginning roughly 4 days ago.  His shortness of breath and productive cough have steadily worsened while the upper respiratory symptoms have improved.  He denies chest pain, fever, or chills.   ED Course: Upon arrival to the ED, patient is found to be afebrile and saturating upper 80s on room air with normal HR and elevated BP.  Labs are most notable for sodium 131, normal creatinine, normal WBC, normal troponin, and negative respiratory virus panel.   CTA chest is negative for PE or acute airspace disease but notable for advanced emphysema.   Patient was placed on supplemental oxygen and treated with IV steroids and multiple short acting nebulized bronchodilators.    Assessment & Plan:   Principal Problem:   COPD with acute exacerbation (HCC) Active Problems:   Paranoid schizophrenia (HCC)   Acute respiratory failure with hypoxia (HCC)     COPD exacerbation; acute hypoxic respiratory failure  - Persistent hypoxic respiratory failure, with exertion even on 4 L of oxygen desatted to 87% at rest 96% Tachypneic with respiratory rate of 20 - Continuing aggressive DuoNeb bronchodilator  treatments scheduled and as needed, - Will continue high-dose IV steroids 80 mg twice daily with plan to taper  -Continue to monitor closely, continue O2 supplements, with plan to wean off, O2 sat goal 88-92%  -IV empiric antibiotics -     Schizophrenia  - Managed with Haldol decanoate injections   Hypertension -Not on any antihypertensive medications - Starting Norvasc -Utilizing as needed hydralazine  Hyperlipidemia - Will continue statins  Chronic tobacco abuse - Measures of quitting tobacco has been discussed with patient in detail - Accepted NicoDerm patch       ------------------------------------------------------------------------------------------------------------------- Nutritional status:  The patient's BMI is: Body mass index is 23.22 kg/m. I agree with the assessment and plan as outlined   ------------------------------------------------------------------------------------------------------------------------------------------------  DVT prophylaxis:  enoxaparin (LOVENOX) injection 40 mg Start: 03/18/24 1000   Code Status:   Code Status: Full Code  Family Communication: No family member present at bedside-  Discussed with patient -Advance care planning has been discussed.   Admission status:   Status is: Inpatient Remains inpatient appropriate because: On supplemental oxygen, needing  breathing treatment, IV steroids   Disposition: From  - home             Planning for discharge in 1-2  days: to Home   Procedures:   No admission procedures for hospital encounter.   Antimicrobials:  Anti-infectives (From admission, onward)    Start     Dose/Rate Route Frequency Ordered Stop   03/18/24 0300  cefTRIAXone  (ROCEPHIN ) 1 g in sodium chloride  0.9 % 100 mL IVPB        1 g 200 mL/hr over 30 Minutes Intravenous Every 24 hours 03/18/24 0218 03/23/24 0259        Medication:   [START ON 03/21/2024] amLODipine  10 mg Oral Daily   aspirin EC  81 mg  Oral Daily   budesonide (PULMICORT) nebulizer solution  0.5 mg Nebulization BID   enoxaparin (LOVENOX) injection  40 mg Subcutaneous Q24H   guaiFENesin  15 mL Oral Q8H   ipratropium  0.5 mg Nebulization TID   levalbuterol  0.63 mg Nebulization TID   methylPREDNISolone (SOLU-MEDROL) injection  80 mg Intravenous Q12H   nicotine   14 mg Transdermal Daily   pneumococcal 20-valent conjugate vaccine  0.5 mL Intramuscular Tomorrow-1000   rosuvastatin   10 mg Oral Daily    acetaminophen  **OR** acetaminophen , ondansetron  **OR** ondansetron  (ZOFRAN ) IV, polyethylene glycol   Objective:   Vitals:   03/20/24 0933 03/20/24 0938 03/20/24 1109 03/20/24 1114  BP:   (!) 140/96   Pulse:   86   Resp:   (!) 30   Temp:      TempSrc:      SpO2: (!) 88% 96% 96% (!) 87%  Weight:      Height:        Intake/Output Summary (Last 24 hours) at 03/20/2024 1328 Last data filed at 03/19/2024 2053 Gross per 24 hour  Intake 360 ml  Output --  Net 360 ml   Filed Weights   03/20/24 0536  Weight: 70.3 kg     Physical examination:    General:  AAO x 3,  cooperative, no distress;   HEENT:  Normocephalic, PERRL, otherwise with in Normal limits   Neuro:  CNII-XII intact. , normal motor and sensation, reflexes intact   Lungs:   Positive breath sounds in all lung fields, improved wheezing and rhonchi diffusely, mild crackles in lower quadrant  Cardio:    S1/S2, RRR, No murmure, No Rubs or Gallops   Abdomen:  Soft, non-tender, bowel sounds active all four quadrants, no guarding or peritoneal signs.  Muscular  skeletal:  Limited exam -global generalized weaknesses - in bed, able to move all 4 extremities,   2+ pulses,  symmetric, No pitting edema  Skin:  Dry, warm to touch, negative for any Rashes,  Wounds: Please see nursing documentation           --------------------------------------------------------------------------------------------------------    LABs:     Latest Ref Rng & Units  03/18/2024    5:44 AM 03/17/2024    9:09 PM 03/12/2024    3:36 PM  CBC  WBC 4.0 - 10.5 K/uL 5.6  8.5  7.8   Hemoglobin 13.0 - 17.0 g/dL 04.5  40.9  81.1   Hematocrit 39.0 - 52.0 % 47.9  50.0  49.2   Platelets 150 - 400 K/uL 213  254  272.0       Latest Ref Rng & Units 03/20/2024    5:10 AM 03/19/2024    5:51 AM 03/18/2024    5:44 AM  CMP  Glucose 70 - 99 mg/dL 914  782  956   BUN 8 - 23 mg/dL 20  14  8    Creatinine 0.61 -  1.24 mg/dL 8.65  7.84  6.96   Sodium 135 - 145 mmol/L 135  132  135   Potassium 3.5 - 5.1 mmol/L 4.2  4.4  4.1   Chloride 98 - 111 mmol/L 100  98  101   CO2 22 - 32 mmol/L 28  28  24    Calcium  8.9 - 10.3 mg/dL 8.5  8.8  9.2        Micro Results Recent Results (from the past 240 hours)  Resp panel by RT-PCR (RSV, Flu A&B, Covid) Anterior Nasal Swab     Status: None   Collection Time: 03/17/24 11:11 PM   Specimen: Anterior Nasal Swab  Result Value Ref Range Status   SARS Coronavirus 2 by RT PCR NEGATIVE NEGATIVE Final    Comment: (NOTE) SARS-CoV-2 target nucleic acids are NOT DETECTED.  The SARS-CoV-2 RNA is generally detectable in upper respiratory specimens during the acute phase of infection. The lowest concentration of SARS-CoV-2 viral copies this assay can detect is 138 copies/mL. A negative result does not preclude SARS-Cov-2 infection and should not be used as the sole basis for treatment or other patient management decisions. A negative result may occur with  improper specimen collection/handling, submission of specimen other than nasopharyngeal swab, presence of viral mutation(s) within the areas targeted by this assay, and inadequate number of viral copies(<138 copies/mL). A negative result must be combined with clinical observations, patient history, and epidemiological information. The expected result is Negative.  Fact Sheet for Patients:  BloggerCourse.com  Fact Sheet for Healthcare Providers:   SeriousBroker.it  This test is no t yet approved or cleared by the [place]  FDA and  has been authorized for detection and/or diagnosis of SARS-CoV-2 by FDA under an Emergency Use Authorization (EUA). This EUA will remain  in effect (meaning this test can be used) for the duration of the COVID-19 declaration under Section 564(b)(1) of the Act, 21 U.S.C.section 360bbb-3(b)(1), unless the authorization is terminated  or revoked sooner.       Influenza A by PCR NEGATIVE NEGATIVE Final   Influenza B by PCR NEGATIVE NEGATIVE Final    Comment: (NOTE) The Xpert Xpress SARS-CoV-2/FLU/RSV plus assay is intended as an aid in the diagnosis of influenza from Nasopharyngeal swab specimens and should not be used as a sole basis for treatment. Nasal washings and aspirates are unacceptable for Xpert Xpress SARS-CoV-2/FLU/RSV testing.  Fact Sheet for Patients: BloggerCourse.com  Fact Sheet for Healthcare Providers: SeriousBroker.it  This test is not yet approved or cleared by the [place]  FDA and has been authorized for detection and/or diagnosis of SARS-CoV-2 by FDA under an Emergency Use Authorization (EUA). This EUA will remain in effect (meaning this test can be used) for the duration of the COVID-19 declaration under Section 564(b)(1) of the Act, 21 U.S.C. section 360bbb-3(b)(1), unless the authorization is terminated or revoked.     Resp Syncytial Virus by PCR NEGATIVE NEGATIVE Final    Comment: (NOTE) Fact Sheet for Patients: BloggerCourse.com  Fact Sheet for Healthcare Providers: SeriousBroker.it  This test is not yet approved or cleared by the [place]  FDA and has been authorized for detection and/or diagnosis of SARS-CoV-2 by FDA under an Emergency Use Authorization (EUA). This EUA will remain in effect (meaning this test can be used) for  the duration of the COVID-19 declaration under Section 564(b)(1) of the Act, 21 U.S.C. section 360bbb-3(b)(1), unless the authorization is terminated or revoked.  Performed at [facility], [address] [place],  [place] [address]   Expectorated Sputum Assessment w Gram Stain, Rflx to Resp Cult     Status: None   Collection Time: 03/18/24  4:40 AM   Specimen: Sputum  Result Value Ref Range Status   Specimen Description SPUTUM  Final   Special Requests NONE  Final   Sputum evaluation   Final    THIS SPECIMEN IS ACCEPTABLE FOR SPUTUM CULTURE Performed at [facility], [address] [place], [place] [address]    Report Status 03/18/2024 FINAL  Final  Culture, Respiratory w Gram Stain     Status: None   Collection Time: 03/18/24  4:40 AM   Specimen: SPU  Result Value Ref Range Status   Specimen Description   Final    SPUTUM Performed at [facility], [address], [place], [place] [address]    Special Requests   Final    NONE Reflexed from [id number] Performed at [facility], [address] [place], [place] [address]    Gram Stain   Final    FEW WBC PRESENT, PREDOMINANTLY PMN MODERATE GRAM POSITIVE COCCI    Culture   Final    ABUNDANT MORAXELLA CATARRHALIS(BRANHAMELLA) BETA LACTAMASE POSITIVE Performed at [facility], [address] [place], [place] [address]    Report Status 03/20/2024 FINAL  Final    Radiology Reports No results found.   SIGNED: Bobbetta Burnet, MD, FHM. FAAFP. Arlin Benes - [facility] Time spent - 55 min.  In seeing, evaluating and examining the patient. Reviewing medical records, labs, drawn plan of care. [facility],  Pager (please use [email or web address] to page/ text) Please use Epic Secure Chat for non-urgent communication (7AM-7PM)  If 7PM-7AM, please contact night-coverage [email or web address], 03/20/2024, 1:28 PM

## 2024-03-20 NOTE — Telephone Encounter (Signed)
 Copied from CRM [id number]. Topic: Clinical - Lab/Test Results >> Mar 18, 2024  5:15 PM Tyronne Galloway wrote: Reason for CRM: Pt's brother Travis Lawrence, verbal consent granted from the pt, called to see the CT results to discuss if the pt has cancer. I attempted to help the pt since NT line was down and CAL was closed. I let the pt know the CT results showed "IMPRESSION: 1. Negative for acute pulmonary embolus or aortic dissection. 2. Advanced emphysema. Stable bilateral pulmonary nodules. No acute airspace disease 3. Aortic atherosclerosis." We both don't know entirely what that means, and Travis Lawrence stated he would call back tomorrow morning for assistance with the pt's results. Pt is in the hospital and Travis Lawrence's phone number is [phone]. >> Mar 19, 2024  9:07 AM Margarette Shawl wrote: Pt's brother, Travis Lawrence, is contacting clinic regarding the appt on 05/20 that was rescheduled. He notes the visit was to review results from second CT scan, adding both scans were done at [facility]. Reviewed chart and advised only 2 CT s ordered by clinic, one in 09/2023 for L[facility] and one in 01/2024. Travis Lawrence states he was present at visit where the 04/05 CT scan was reviewed. He insists pt told him another CT was done after the 04/05 CT scan. He is concerned due to a significant family history of lung cancer and wants to know why second CT scan was performed.   Requests call back to speak about the imaging and if there were concerns with the results. Of note, pt is currently hospitalized at [facility]. He does have a history of schizophrenia and Travis Lawrence is attempting to step in and get more information about next steps with his treatment based off the CT results.   CB#  [phone]  Duplicate, closing encounter.

## 2024-03-20 NOTE — Progress Notes (Signed)
 Mobility Specialist - Progress Note   03/20/24 1114  Oxygen Therapy  SpO2 (!) 87 %  O2 Device Nasal Cannula  O2 Flow Rate (L/min) 4 L/min  Patient Activity (if Appropriate) Ambulating  Mobility  Activity Ambulated with assistance in hallway  Level of Assistance Standby assist, set-up cues, supervision of patient - no hands on  Assistive Device Front wheel walker  Distance Ambulated (ft) 120 ft  Activity Response Tolerated well  Mobility Referral Yes  Mobility visit 1 Mobility  Mobility Specialist Start Time (ACUTE ONLY) 1052  Mobility Specialist Stop Time (ACUTE ONLY) 1111  Mobility Specialist Time Calculation (min) (ACUTE ONLY) 19 min   Pt received in bed and agreeable to mobility. During ambulation, pt desat to 87%. Encouraged pursed lip breaths bringing SpO2 to 90%. No complaints during session. Pt to recliner after session with all needs met. RN in room.    During mobility: 87% SpO2 (4L [place]) Post-mobility: 90% SPO2 (4L [place])  Travis Lawrence Mobility Specialist

## 2024-03-20 NOTE — Plan of Care (Addendum)
 VSS. Patient given PRN Tylenol  for pain. LBM 5/23. At ~0535 patient ambulated to bathroom and desat'd to 80s. Oxygen increased to 4L [place]. No acute events overnight.  Problem: Education: Goal: Knowledge of General Education information will improve Description: Including pain rating scale, medication(s)/side effects and non-pharmacologic comfort measures Outcome: Progressing   Problem: Health Behavior/Discharge Planning: Goal: Ability to manage health-related needs will improve Outcome: Progressing   Problem: Clinical Measurements: Goal: Ability to maintain clinical measurements within normal limits will improve Outcome: Progressing Goal: Respiratory complications will improve Outcome: Progressing   Problem: Pain Managment: Goal: General experience of comfort will improve and/or be controlled Outcome: Progressing   Problem: Safety: Goal: Ability to remain free from injury will improve Outcome: Progressing   Problem: Education: Goal: Knowledge of disease or condition will improve Outcome: Progressing   Problem: Activity: Goal: Ability to tolerate increased activity will improve Outcome: Progressing   Problem: Respiratory: Goal: Ability to maintain a clear airway will improve Outcome: Progressing Goal: Levels of oxygenation will improve Outcome: Progressing Goal: Ability to maintain adequate ventilation will improve Outcome: Progressing

## 2024-03-21 ENCOUNTER — Inpatient Hospital Stay (HOSPITAL_COMMUNITY)

## 2024-03-21 DIAGNOSIS — J441 Chronic obstructive pulmonary disease with (acute) exacerbation: Secondary | ICD-10-CM | POA: Diagnosis not present

## 2024-03-21 MED ORDER — FUROSEMIDE 10 MG/ML IJ SOLN
40.0000 mg | Freq: Once | INTRAMUSCULAR | Status: AC
  Administered 2024-03-21: 40 mg via INTRAVENOUS
  Filled 2024-03-21: qty 4

## 2024-03-21 MED ORDER — LEVOFLOXACIN 500 MG PO TABS
500.0000 mg | ORAL_TABLET | Freq: Every day | ORAL | Status: DC
  Administered 2024-03-21 – 2024-03-24 (×4): 500 mg via ORAL
  Filled 2024-03-21 (×4): qty 1

## 2024-03-21 MED ORDER — METHYLPREDNISOLONE SODIUM SUCC 40 MG IJ SOLR
40.0000 mg | Freq: Two times a day (BID) | INTRAMUSCULAR | Status: DC
  Administered 2024-03-21 – 2024-03-23 (×5): 40 mg via INTRAVENOUS
  Filled 2024-03-21 (×5): qty 1

## 2024-03-21 NOTE — Plan of Care (Signed)

## 2024-03-21 NOTE — Progress Notes (Signed)
 Mobility Specialist - Progress Note   03/21/24 1110  Oxygen Therapy  SpO2 (!) 86 %  O2 Device Nasal Cannula  O2 Flow Rate (L/min) 3 L/min  Patient Activity (if Appropriate) Ambulating  Mobility  Activity Ambulated with assistance in hallway  Level of Assistance Modified independent, requires aide device or extra time  Assistive Device Front wheel walker  Distance Ambulated (ft) 250 ft  Activity Response Tolerated well  Mobility Referral Yes  Mobility visit 1 Mobility  Mobility Specialist Start Time (ACUTE ONLY) 1049  Mobility Specialist Stop Time (ACUTE ONLY) 1106  Mobility Specialist Time Calculation (min) (ACUTE ONLY) 17 min   Pt received in bed and agreeable to mobility. Pt took x2 seated rest breaks & x2 standing rest breaks during ambulation. Upon returning to room, pt desat to 86%. Encouraged seated pursed lip breaths bringing SpO2 to 94%. No complaints during session. Pt to EOB after session with all needs met.    Pre-mobility: 93% SpO2 (3L [place]) During mobility: 91% SpO2 (3L [place]) Post-mobility: 86-94% SPO2 (3L [place])  Chief Technology Officer

## 2024-03-21 NOTE — Plan of Care (Addendum)
 VSS. Patient on 3L [place] with O2 sat above 95%. Patient given PRN Tylenol  for pain. LBM 5/23. No acute events overnight.  Problem: Education: Goal: Knowledge of General Education information will improve Description: Including pain rating scale, medication(s)/side effects and non-pharmacologic comfort measures Outcome: Progressing   Problem: Health Behavior/Discharge Planning: Goal: Ability to manage health-related needs will improve Outcome: Progressing   Problem: Clinical Measurements: Goal: Ability to maintain clinical measurements within normal limits will improve Outcome: Progressing Goal: Respiratory complications will improve Outcome: Progressing   Problem: Pain Managment: Goal: General experience of comfort will improve and/or be controlled Outcome: Progressing   Problem: Safety: Goal: Ability to remain free from injury will improve Outcome: Progressing   Problem: Education: Goal: Knowledge of disease or condition will improve Outcome: Progressing Goal: Knowledge of the prescribed therapeutic regimen will improve Outcome: Progressing   Problem: Activity: Goal: Ability to tolerate increased activity will improve Outcome: Progressing Goal: Will verbalize the importance of balancing activity with adequate rest periods Outcome: Progressing   Problem: Respiratory: Goal: Ability to maintain a clear airway will improve Outcome: Progressing Goal: Levels of oxygenation will improve Outcome: Progressing Goal: Ability to maintain adequate ventilation will improve Outcome: Progressing

## 2024-03-21 NOTE — Progress Notes (Signed)
 PROGRESS NOTE    Patient: Travis Lawrence                            PCP: Francenia Ingle, NP                    DOB: [birth year]            DOA: 03/17/2024 ZOX:[id number]             DOS: 03/21/2024, 11:48 AM   LOS: 3 days   Date of Service: The patient was seen and examined on 03/21/2024  Subjective:   The patient was seen and examined this morning, reporting improved shortness of breath, wheezing, but still satting 86% on 3 L of oxygen He is not O2 dependent at home   Brief Narrative:    Travis Lawrence is a 68 y.o. male with medical history significant for advanced emphysema and schizophrenia who presents with shortness of breath.   Patient reports that he recently developed some sinus congestion and cough, and then worsening shortness of breath beginning roughly 4 days ago.  His shortness of breath and productive cough have steadily worsened while the upper respiratory symptoms have improved.  He denies chest pain, fever, or chills.   ED Course: Upon arrival to the ED, patient is found to be afebrile and saturating upper 80s on room air with normal HR and elevated BP.  Labs are most notable for sodium 131, normal creatinine, normal WBC, normal troponin, and negative respiratory virus panel.   CTA chest is negative for PE or acute airspace disease but notable for advanced emphysema.   Patient was placed on supplemental oxygen and treated with IV steroids and multiple short acting nebulized bronchodilators.    Assessment & Plan:   Principal Problem:   COPD with acute exacerbation (HCC) Active Problems:   Paranoid schizophrenia (HCC)   Acute respiratory failure with hypoxia (HCC)     COPD exacerbation; acute hypoxic respiratory failure  - Persistent hypoxic respiratory failure,  He is down from 4 to 3 L of oxygen, but satting 86% with exertion -Otherwise improved shortness of breath and tachypnea  - Continuing aggressive DuoNeb bronchodilator treatments scheduled  and as needed, continue Pulmicort nebs, - Will continue high-dose IV steroids 80 mg t will taper down slowly -Trial of diuretics x 1 today -  -Continue to monitor closely, continue O2 supplements, with plan to wean off, O2 sat goal 88-92%  -IV empiric antibiotics Rocephin , will switch to p.o. Levaquin      Schizophrenia  Stable  - Managed with Haldol decanoate injections   Hypertension BP improved -Not on any antihypertensive medications - Starting Norvasc -Utilizing as needed hydralazine  Hyperlipidemia - Will continue statins  Chronic tobacco abuse - Measures of quitting tobacco has been discussed with patient in detail - Accepted NicoDerm patch       ------------------------------------------------------------------------------------------------------------------- Nutritional status:  The patient's BMI is: Body mass index is 23.22 kg/m. I agree with the assessment and plan as outlined   ------------------------------------------------------------------------------------------------------------------------------------------------  DVT prophylaxis:  enoxaparin (LOVENOX) injection 40 mg Start: 03/18/24 1000   Code Status:   Code Status: Full Code  Family Communication: No family member present at bedside-  Discussed with patient -Advance care planning has been discussed.   Admission status:   Status is: Inpatient Remains inpatient appropriate because: On supplemental oxygen, needing  breathing treatment, IV steroids   Disposition: From  - home  Planning for discharge in 1-2  days: to Home   Procedures:   No admission procedures for hospital encounter.   Antimicrobials:  Anti-infectives (From admission, onward)    Start     Dose/Rate Route Frequency Ordered Stop   03/21/24 1245  levofloxacin  (LEVAQUIN ) tablet 500 mg        500 mg Oral Daily 03/21/24 1146     03/18/24 0300  cefTRIAXone  (ROCEPHIN ) 1 g in sodium chloride  0.9 % 100 mL IVPB   Status:  Discontinued        1 g 200 mL/hr over 30 Minutes Intravenous Every 24 hours 03/18/24 0218 03/21/24 1146        Medication:   amLODipine  10 mg Oral Daily   aspirin EC  81 mg Oral Daily   budesonide (PULMICORT) nebulizer solution  0.5 mg Nebulization BID   enoxaparin (LOVENOX) injection  40 mg Subcutaneous Q24H   guaiFENesin  15 mL Oral Q8H   ipratropium  0.5 mg Nebulization TID   levalbuterol  0.63 mg Nebulization TID   levofloxacin   500 mg Oral Daily   methylPREDNISolone (SOLU-MEDROL) injection  40 mg Intravenous Q12H   nicotine   14 mg Transdermal Daily   pneumococcal 20-valent conjugate vaccine  0.5 mL Intramuscular Tomorrow-1000   rosuvastatin   10 mg Oral Daily    acetaminophen  **OR** acetaminophen , ondansetron  **OR** ondansetron  (ZOFRAN ) IV, polyethylene glycol   Objective:   Vitals:   03/21/24 0734 03/21/24 0736 03/21/24 0822 03/21/24 1110  BP:      Pulse:      Resp:      Temp:      TempSrc:      SpO2: 96% 97% 90% (!) 86%  Weight:      Height:        Intake/Output Summary (Last 24 hours) at 03/21/2024 1148 Last data filed at 03/20/2024 1758 Gross per 24 hour  Intake 720 ml  Output --  Net 720 ml   Filed Weights   03/20/24 0536  Weight: 70.3 kg     Physical examination:    General:  AAO x 3,  cooperative, no distress;   HEENT:  Normocephalic, PERRL, otherwise with in Normal limits   Neuro:  CNII-XII intact. , normal motor and sensation, reflexes intact   Lungs:   Positive breath sounds in all lung fields, improved wheezing and rhonchi diffusely, mild crackles in lower quadrant  Cardio:    S1/S2, RRR, No murmure, No Rubs or Gallops   Abdomen:  Soft, non-tender, bowel sounds active all four quadrants, no guarding or peritoneal signs.  Muscular  skeletal:  Limited exam -global generalized weaknesses - in bed, able to move all 4 extremities,   2+ pulses,  symmetric, No pitting edema  Skin:  Dry, warm to touch, negative for any Rashes,   Wounds: Please see nursing documentation           --------------------------------------------------------------------------------------------------------    LABs:     Latest Ref Rng & Units 03/18/2024    5:44 AM 03/17/2024    9:09 PM 03/12/2024    3:36 PM  CBC  WBC 4.0 - 10.5 K/uL 5.6  8.5  7.8   Hemoglobin 13.0 - 17.0 g/dL 84.6  96.2  95.2   Hematocrit 39.0 - 52.0 % 47.9  50.0  49.2   Platelets 150 - 400 K/uL 213  254  272.0       Latest Ref Rng & Units 03/20/2024    5:10 AM 03/19/2024    5:51  AM 03/18/2024    5:44 AM  CMP  Glucose 70 - 99 mg/dL 782  956  213   BUN 8 - 23 mg/dL 20  14  8    Creatinine 0.61 - 1.24 mg/dL 0.86  5.78  4.69   Sodium 135 - 145 mmol/L 135  132  135   Potassium 3.5 - 5.1 mmol/L 4.2  4.4  4.1   Chloride 98 - 111 mmol/L 100  98  101   CO2 22 - 32 mmol/L 28  28  24    Calcium  8.9 - 10.3 mg/dL 8.5  8.8  9.2        Micro Results Recent Results (from the past 240 hours)  Resp panel by RT-PCR (RSV, Flu A&B, Covid) Anterior Nasal Swab     Status: None   Collection Time: 03/17/24 11:11 PM   Specimen: Anterior Nasal Swab  Result Value Ref Range Status   SARS Coronavirus 2 by RT PCR NEGATIVE NEGATIVE Final    Comment: (NOTE) SARS-CoV-2 target nucleic acids are NOT DETECTED.  The SARS-CoV-2 RNA is generally detectable in upper respiratory specimens during the acute phase of infection. The lowest concentration of SARS-CoV-2 viral copies this assay can detect is 138 copies/mL. A negative result does not preclude SARS-Cov-2 infection and should not be used as the sole basis for treatment or other patient management decisions. A negative result may occur with  improper specimen collection/handling, submission of specimen other than nasopharyngeal swab, presence of viral mutation(s) within the areas targeted by this assay, and inadequate number of viral copies(<138 copies/mL). A negative result must be combined with clinical observations, patient  history, and epidemiological information. The expected result is Negative.  Fact Sheet for Patients:  BloggerCourse.com  Fact Sheet for Healthcare Providers:  SeriousBroker.it  This test is no t yet approved or cleared by the [place]  FDA and  has been authorized for detection and/or diagnosis of SARS-CoV-2 by FDA under an Emergency Use Authorization (EUA). This EUA will remain  in effect (meaning this test can be used) for the duration of the COVID-19 declaration under Section 564(b)(1) of the Act, 21 U.S.C.section 360bbb-3(b)(1), unless the authorization is terminated  or revoked sooner.       Influenza A by PCR NEGATIVE NEGATIVE Final   Influenza B by PCR NEGATIVE NEGATIVE Final    Comment: (NOTE) The Xpert Xpress SARS-CoV-2/FLU/RSV plus assay is intended as an aid in the diagnosis of influenza from Nasopharyngeal swab specimens and should not be used as a sole basis for treatment. Nasal washings and aspirates are unacceptable for Xpert Xpress SARS-CoV-2/FLU/RSV testing.  Fact Sheet for Patients: BloggerCourse.com  Fact Sheet for Healthcare Providers: SeriousBroker.it  This test is not yet approved or cleared by the [place]  FDA and has been authorized for detection and/or diagnosis of SARS-CoV-2 by FDA under an Emergency Use Authorization (EUA). This EUA will remain in effect (meaning this test can be used) for the duration of the COVID-19 declaration under Section 564(b)(1) of the Act, 21 U.S.C. section 360bbb-3(b)(1), unless the authorization is terminated or revoked.     Resp Syncytial Virus by PCR NEGATIVE NEGATIVE Final    Comment: (NOTE) Fact Sheet for Patients: BloggerCourse.com  Fact Sheet for Healthcare Providers: SeriousBroker.it  This test is not yet approved or cleared by the [place]  FDA  and has been authorized for detection and/or diagnosis of SARS-CoV-2 by FDA under an Emergency Use Authorization (EUA). This EUA will remain in effect (meaning this test can be  used) for the duration of the COVID-19 declaration under Section 564(b)(1) of the Act, 21 U.S.C. section 360bbb-3(b)(1), unless the authorization is terminated or revoked.  Performed at [facility], [address] [place], [place] [address]   Expectorated Sputum Assessment w Gram Stain, Rflx to Resp Cult     Status: None   Collection Time: 03/18/24  4:40 AM   Specimen: Sputum  Result Value Ref Range Status   Specimen Description SPUTUM  Final   Special Requests NONE  Final   Sputum evaluation   Final    THIS SPECIMEN IS ACCEPTABLE FOR SPUTUM CULTURE Performed at [facility], [address], [place], [place] [address]    Report Status 03/18/2024 FINAL  Final  Culture, Respiratory w Gram Stain     Status: None   Collection Time: 03/18/24  4:40 AM   Specimen: SPU  Result Value Ref Range Status   Specimen Description   Final    SPUTUM Performed at [facility], [address] [place], [place] [address]    Special Requests   Final    NONE Reflexed from [id number] Performed at [facility], [address], [place], [place] [address]    Gram Stain   Final    FEW WBC PRESENT, PREDOMINANTLY PMN MODERATE GRAM POSITIVE COCCI    Culture   Final    ABUNDANT MORAXELLA CATARRHALIS(BRANHAMELLA) BETA LACTAMASE POSITIVE Performed at [facility], [address] [place], [place] [address]    Report Status 03/20/2024 FINAL  Final    Radiology Reports No results found.   SIGNED: Bobbetta Burnet, MD, FHM. FAAFP. Arlin Benes - [facility] Time spent - 55 min.  In seeing, evaluating and examining the patient. Reviewing medical records, labs, drawn plan of care. [facility],  Pager (please use [email or web address] to page/  text) Please use Epic Secure Chat for non-urgent communication (7AM-7PM)  If 7PM-7AM, please contact night-coverage [email or web address], 03/21/2024, 11:48 AM

## 2024-03-21 NOTE — Progress Notes (Signed)
 Patient was taken off oxygen and post 20 minutes his room air oxygen saturation is 90%. No distress noted.

## 2024-03-22 DIAGNOSIS — J441 Chronic obstructive pulmonary disease with (acute) exacerbation: Secondary | ICD-10-CM | POA: Diagnosis not present

## 2024-03-22 MED ORDER — IPRATROPIUM BROMIDE 0.02 % IN SOLN
0.5000 mg | Freq: Four times a day (QID) | RESPIRATORY_TRACT | Status: DC
  Administered 2024-03-22 – 2024-03-24 (×7): 0.5 mg via RESPIRATORY_TRACT
  Filled 2024-03-22 (×7): qty 2.5

## 2024-03-22 MED ORDER — LEVALBUTEROL HCL 0.63 MG/3ML IN NEBU
0.6300 mg | INHALATION_SOLUTION | Freq: Four times a day (QID) | RESPIRATORY_TRACT | Status: DC
  Administered 2024-03-22 – 2024-03-24 (×7): 0.63 mg via RESPIRATORY_TRACT
  Filled 2024-03-22 (×7): qty 3

## 2024-03-22 NOTE — Plan of Care (Addendum)
 VSS. Patient remains on 3L . Patient given PRN Tylenol  for pain. No acute events overnight.  Problem: Education: Goal: Knowledge of General Education information will improve Description: Including pain rating scale, medication(s)/side effects and non-pharmacologic comfort measures Outcome: Progressing   Problem: Health Behavior/Discharge Planning: Goal: Ability to manage health-related needs will improve Outcome: Progressing   Problem: Clinical Measurements: Goal: Ability to maintain clinical measurements within normal limits will improve Outcome: Progressing Goal: Will remain free from infection Outcome: Progressing   Problem: Pain Managment: Goal: General experience of comfort will improve and/or be controlled Outcome: Progressing   Problem: Education: Goal: Knowledge of disease or condition will improve Outcome: Progressing   Problem: Activity: Goal: Ability to tolerate increased activity will improve Outcome: Progressing   Problem: Respiratory: Goal: Ability to maintain a clear airway will improve Outcome: Progressing Goal: Levels of oxygenation will improve Outcome: Progressing Goal: Ability to maintain adequate ventilation will improve Outcome: Progressing

## 2024-03-22 NOTE — Progress Notes (Signed)
 PROGRESS NOTE    Patient: Travis Lawrence                            PCP: Francenia Ingle, NP                    DOB: 10-30-1955            DOA: 03/17/2024 NWG:[id number]             DOS: 03/22/2024, 2:06 PM   LOS: 4 days   Date of Service: The patient was seen and examined on 03/22/2024  Subjective:   The patient was seen and examined this morning, sitting up in bed, mild shortness of breath, on 3 L of oxygen, satting 98% Denies any chest pain... Worsening shortness of breath with minimal exertion   Brief Narrative:    Travis Lawrence is a 68 y.o. male with medical history significant for advanced emphysema and schizophrenia who presents with shortness of breath.   Patient reports that he recently developed some sinus congestion and cough, and then worsening shortness of breath beginning roughly 4 days ago.  His shortness of breath and productive cough have steadily worsened while the upper respiratory symptoms have improved.  He denies chest pain, fever, or chills.   ED Course: Upon arrival to the ED, patient is found to be afebrile and saturating upper 80s on room air with normal HR and elevated BP.  Labs are most notable for sodium 131, normal creatinine, normal WBC, normal troponin, and negative respiratory virus panel.   CTA chest is negative for PE or acute airspace disease but notable for advanced emphysema.   Patient was placed on supplemental oxygen and treated with IV steroids and multiple short acting nebulized bronchodilators.    Assessment & Plan:   Principal Problem:   COPD with acute exacerbation (HCC) Active Problems:   Paranoid schizophrenia (HCC)   Acute respiratory failure with hypoxia (HCC)     COPD exacerbation; acute hypoxic respiratory failure  - Persistent hypoxic respiratory failure,  -Persistent shortness of breath, down from 4 L to 3 L of oxygen, satting 98%  Shortness of breath and tachypnea with minimal exertion  - Continuing aggressive  breathing treatment DuoNeb bronchodilator treatments scheduled and as needed, continue Pulmicort nebs, Changed to Xopenex and Atrovent 4 times daily  - Will continue high-dose IV steroids 80 >>>> 40 mg t will taper down slowly -Trial of diuretics x 1 today   -At this point it appears the patient needs to be discharged on home O2 goal O2 sat 88-92%  -IV empiric antibiotics Rocephin , will switch to p.o. Levaquin      Schizophrenia  Stable  - Managed with Haldol decanoate injections   Hypertension -Improved, continue Norvasc -Not on any antihypertensive medications - 5/23 started Norvasc -Utilizing as needed hydralazine  Hyperlipidemia - continue statins  Chronic tobacco abuse - Measures of quitting tobacco has been discussed with patient in detail - Accepted NicoDerm patch       ------------------------------------------------------------------------------------------------------------------- Nutritional status:  The patient's BMI is: Body mass index is 23.22 kg/m. I agree with the assessment and plan as outlined   ------------------------------------------------------------------------------------------------------------------------------------------------  DVT prophylaxis:  enoxaparin (LOVENOX) injection 40 mg Start: 03/18/24 1000   Code Status:   Code Status: Full Code  Family Communication: No family member present at bedside-  Discussed with patient -Advance care planning has been discussed.   Admission status:   Status is:  Inpatient Remains inpatient appropriate because: On supplemental oxygen, needing  breathing treatment, IV steroids   Disposition: From  - home             Planning for discharge in 1-2  days: to Home   Procedures:   No admission procedures for hospital encounter.   Antimicrobials:  Anti-infectives (From admission, onward)    Start     Dose/Rate Route Frequency Ordered Stop   03/21/24 1245  levofloxacin  (LEVAQUIN ) tablet 500 mg         500 mg Oral Daily 03/21/24 1146     03/18/24 0300  cefTRIAXone  (ROCEPHIN ) 1 g in sodium chloride  0.9 % 100 mL IVPB  Status:  Discontinued        1 g 200 mL/hr over 30 Minutes Intravenous Every 24 hours 03/18/24 0218 03/21/24 1146        Medication:   amLODipine  10 mg Oral Daily   aspirin EC  81 mg Oral Daily   budesonide (PULMICORT) nebulizer solution  0.5 mg Nebulization BID   enoxaparin (LOVENOX) injection  40 mg Subcutaneous Q24H   guaiFENesin  15 mL Oral Q8H   ipratropium  0.5 mg Nebulization QID   levalbuterol  0.63 mg Nebulization QID   levofloxacin   500 mg Oral Daily   methylPREDNISolone (SOLU-MEDROL) injection  40 mg Intravenous Q12H   nicotine   14 mg Transdermal Daily   rosuvastatin   10 mg Oral Daily    acetaminophen  **OR** acetaminophen , ondansetron  **OR** ondansetron  (ZOFRAN ) IV, polyethylene glycol   Objective:   Vitals:   03/22/24 0831 03/22/24 0835 03/22/24 1143 03/22/24 1221  BP:    121/89  Pulse:    70  Resp:    20  Temp:    97.7 F (36.5 C)  TempSrc:    Oral  SpO2: 93% 94% 94% 98%  Weight:      Height:        Intake/Output Summary (Last 24 hours) at 03/22/2024 1406 Last data filed at 03/22/2024 1120 Gross per 24 hour  Intake 1400 ml  Output --  Net 1400 ml   Filed Weights   03/20/24 0536  Weight: 70.3 kg     Physical examination:        General:  AAO x 3,  cooperative, no distress;   HEENT:  Normocephalic, PERRL, otherwise with in Normal limits   Neuro:  CNII-XII intact. , normal motor and sensation, reflexes intact   Lungs:   Bilateral breath sounds, diffuse wheezing and rhonchi, negative for any crackles  Cardio:    S1/S2, RRR, No murmure, No Rubs or Gallops   Abdomen:  Soft, non-tender, bowel sounds active all four quadrants, no guarding or peritoneal signs.  Muscular  skeletal:  Limited exam -global generalized weaknesses - in bed, able to move all 4 extremities,   2+ pulses,  symmetric, No pitting edema  Skin:  Dry,  warm to touch, negative for any Rashes,  Wounds: Please see nursing documentation         --------------------------------------------------------------------------------------------------------    LABs:     Latest Ref Rng & Units 03/18/2024    5:44 AM 03/17/2024    9:09 PM 03/12/2024    3:36 PM  CBC  WBC 4.0 - 10.5 K/uL 5.6  8.5  7.8   Hemoglobin 13.0 - 17.0 g/dL 19.1  47.8  29.5   Hematocrit 39.0 - 52.0 % 47.9  50.0  49.2   Platelets 150 - 400 K/uL 213  254  272.0  Latest Ref Rng & Units 03/20/2024    5:10 AM 03/19/2024    5:51 AM 03/18/2024    5:44 AM  CMP  Glucose 70 - 99 mg/dL 161  096  045   BUN 8 - 23 mg/dL 20  14  8    Creatinine 0.61 - 1.24 mg/dL 4.09  8.11  9.14   Sodium 135 - 145 mmol/L 135  132  135   Potassium 3.5 - 5.1 mmol/L 4.2  4.4  4.1   Chloride 98 - 111 mmol/L 100  98  101   CO2 22 - 32 mmol/L 28  28  24    Calcium  8.9 - 10.3 mg/dL 8.5  8.8  9.2        Micro Results Recent Results (from the past 240 hours)  Resp panel by RT-PCR (RSV, Flu A&B, Covid) Anterior Nasal Swab     Status: None   Collection Time: 03/17/24 11:11 PM   Specimen: Anterior Nasal Swab  Result Value Ref Range Status   SARS Coronavirus 2 by RT PCR NEGATIVE NEGATIVE Final    Comment: (NOTE) SARS-CoV-2 target nucleic acids are NOT DETECTED.  The SARS-CoV-2 RNA is generally detectable in upper respiratory specimens during the acute phase of infection. The lowest concentration of SARS-CoV-2 viral copies this assay can detect is 138 copies/mL. A negative result does not preclude SARS-Cov-2 infection and should not be used as the sole basis for treatment or other patient management decisions. A negative result may occur with  improper specimen collection/handling, submission of specimen other than nasopharyngeal swab, presence of viral mutation(s) within the areas targeted by this assay, and inadequate number of viral copies(<138 copies/mL). A negative result must be combined  with clinical observations, patient history, and epidemiological information. The expected result is Negative.  Fact Sheet for Patients:  BloggerCourse.com  Fact Sheet for Healthcare Providers:  SeriousBroker.it  This test is no t yet approved or cleared by the [place]  FDA and  has been authorized for detection and/or diagnosis of SARS-CoV-2 by FDA under an Emergency Use Authorization (EUA). This EUA will remain  in effect (meaning this test can be used) for the duration of the COVID-19 declaration under Section 564(b)(1) of the Act, 21 U.S.C.section 360bbb-3(b)(1), unless the authorization is terminated  or revoked sooner.       Influenza A by PCR NEGATIVE NEGATIVE Final   Influenza B by PCR NEGATIVE NEGATIVE Final    Comment: (NOTE) The Xpert Xpress SARS-CoV-2/FLU/RSV plus assay is intended as an aid in the diagnosis of influenza from Nasopharyngeal swab specimens and should not be used as a sole basis for treatment. Nasal washings and aspirates are unacceptable for Xpert Xpress SARS-CoV-2/FLU/RSV testing.  Fact Sheet for Patients: BloggerCourse.com  Fact Sheet for Healthcare Providers: SeriousBroker.it  This test is not yet approved or cleared by the [place]  FDA and has been authorized for detection and/or diagnosis of SARS-CoV-2 by FDA under an Emergency Use Authorization (EUA). This EUA will remain in effect (meaning this test can be used) for the duration of the COVID-19 declaration under Section 564(b)(1) of the Act, 21 U.S.C. section 360bbb-3(b)(1), unless the authorization is terminated or revoked.     Resp Syncytial Virus by PCR NEGATIVE NEGATIVE Final    Comment: (NOTE) Fact Sheet for Patients: BloggerCourse.com  Fact Sheet for Healthcare Providers: SeriousBroker.it  This test is not yet approved  or cleared by the [place]  FDA and has been authorized for detection and/or diagnosis of SARS-CoV-2 by FDA under  an Emergency Use Authorization (EUA). This EUA will remain in effect (meaning this test can be used) for the duration of the COVID-19 declaration under Section 564(b)(1) of the Act, 21 U.S.C. section 360bbb-3(b)(1), unless the authorization is terminated or revoked.  Performed at [facility], [address], [place], [place] [address]   Expectorated Sputum Assessment w Gram Stain, Rflx to Resp Cult     Status: None   Collection Time: 03/18/24  4:40 AM   Specimen: Sputum  Result Value Ref Range Status   Specimen Description SPUTUM  Final   Special Requests NONE  Final   Sputum evaluation   Final    THIS SPECIMEN IS ACCEPTABLE FOR SPUTUM CULTURE Performed at [facility], [address], [place], [place] [address]    Report Status 03/18/2024 FINAL  Final  Culture, Respiratory w Gram Stain     Status: None   Collection Time: 03/18/24  4:40 AM   Specimen: SPU  Result Value Ref Range Status   Specimen Description   Final    SPUTUM Performed at [facility], [address], [place], [place] [address]    Special Requests   Final    NONE Reflexed from [id number] Performed at [facility], [address], [place], [place] [address]    Gram Stain   Final    FEW WBC PRESENT, PREDOMINANTLY PMN MODERATE GRAM POSITIVE COCCI    Culture   Final    ABUNDANT MORAXELLA CATARRHALIS(BRANHAMELLA) BETA LACTAMASE POSITIVE Performed at [facility], [address], [place], [place] [address]    Report Status 03/20/2024 FINAL  Final    Radiology Reports DG Chest 2 View Result Date: 03/21/2024 CLINICAL DATA:  Shortness of breath and what cough for 4 days. History of previous lung biopsy. EXAM: CHEST - 2 VIEW COMPARISON:  X-ray and CTA 03/17/2024. FINDINGS: Hyperinflation. No consolidation, pneumothorax  or effusion. Chronic lung changes identified. Normal cardiopericardial silhouette with calcified aorta. Biopsy clip along the medial right upper lung. Degenerative changes along the spine. Please correlate with recent CTA. IMPRESSION: Hyperinflation with chronic changes. Electronically Signed   By: Adrianna Horde M.D.   On: 03/21/2024 16:11     SIGNED: Bobbetta Burnet, MD, FHM. FAAFP. Arlin Benes - [facility] Time spent - 55 min.  In seeing, evaluating and examining the patient. Reviewing medical records, labs, drawn plan of care. [facility],  Pager (please use [email or web address] to page/ text) Please use Epic Secure Chat for non-urgent communication (7AM-7PM)  If 7PM-7AM, please contact night-coverage [email or web address], 03/22/2024, 2:06 PM

## 2024-03-22 NOTE — Plan of Care (Signed)

## 2024-03-22 NOTE — Progress Notes (Signed)
 Mobility Specialist - Progress Note  ([place] 2L) Pre-mobility: 95 bpm HR, 92% SpO2 During mobility: 87-88% SpO2 Post-mobility: 93 bpm HR, 91% SPO2   03/22/24 1330  Mobility  Activity Ambulated with assistance in hallway  Level of Assistance Standby assist, set-up cues, supervision of patient - no hands on  Assistive Device Front wheel walker  Distance Ambulated (ft) 200 ft  Range of Motion/Exercises Active  Activity Response Tolerated well  Mobility Referral Yes  Mobility visit 1 Mobility  Mobility Specialist Start Time (ACUTE ONLY) 1315  Mobility Specialist Stop Time (ACUTE ONLY) 1330  Mobility Specialist Time Calculation (min) (ACUTE ONLY) 15 min   Pt was found sitting EOB and agreeable to ambulate. Grew fatigued with session. Had x1 brief standing rest break due to fatigue and SPO2 reading of 88%. Able to increase >90% within 1 min. Upon returning to room SPO2 decreased to 87% and able to increase >90% within 2 min. At EOS was left sitting EOB with all needs met. Call bell in reach.  Lorna Rose,  Mobility Specialist Can be reached via Secure Chat

## 2024-03-23 DIAGNOSIS — J441 Chronic obstructive pulmonary disease with (acute) exacerbation: Secondary | ICD-10-CM | POA: Diagnosis not present

## 2024-03-23 LAB — GLUCOSE, CAPILLARY: Glucose-Capillary: 140 mg/dL — ABNORMAL HIGH (ref 70–99)

## 2024-03-23 MED ORDER — FUROSEMIDE 10 MG/ML IJ SOLN
20.0000 mg | Freq: Once | INTRAMUSCULAR | Status: AC
  Administered 2024-03-23: 20 mg via INTRAVENOUS
  Filled 2024-03-23: qty 2

## 2024-03-23 NOTE — Progress Notes (Signed)
 Mobility Specialist - Progress Note   03/23/24 0940  Oxygen Therapy  SpO2 (!) 85 %  O2 Device Nasal Cannula  O2 Flow Rate (L/min) 3 L/min  Patient Activity (if Appropriate) Ambulating  Mobility  Activity Ambulated with assistance in hallway  Level of Assistance Modified independent, requires aide device or extra time  Assistive Device Front wheel walker  Distance Ambulated (ft) 275 ft  Activity Response Tolerated well  Mobility Referral Yes  Mobility visit 1 Mobility  Mobility Specialist Start Time (ACUTE ONLY) 0914  Mobility Specialist Stop Time (ACUTE ONLY) [id number]  Mobility Specialist Time Calculation (min) (ACUTE ONLY) 23 min   Pt received in bed and agreeable to mobility. Pt took x2 standing rest breaks d/t fatigue. Dyspnea with exertion. Upon returning to room, pt desat to 85%. Encouraged pursed lip breaths bringing SpO2 to 92%. No complaints during session. Pt to bed after session with all needs met.     Pre-mobility: 94 HR, 94% SpO2 (3L [place]) During mobility: 85-92% SpO2 (3L [place]) Post-mobility: 99 HR, 92% SPO2 (3L [place])  Chief Technology Officer

## 2024-03-23 NOTE — Plan of Care (Signed)
  Problem: Clinical Measurements: Goal: Respiratory complications will improve Outcome: Progressing   Problem: Coping: Goal: Level of anxiety will decrease Outcome: Progressing   Problem: Elimination: Goal: Will not experience complications related to bowel motility Outcome: Progressing Goal: Will not experience complications related to urinary retention Outcome: Progressing   Problem: Safety: Goal: Ability to remain free from injury will improve Outcome: Progressing   Problem: Education: Goal: Knowledge of disease or condition will improve Outcome: Progressing   Problem: Respiratory: Goal: Ability to maintain a clear airway will improve Outcome: Progressing Goal: Levels of oxygenation will improve Outcome: Progressing Goal: Ability to maintain adequate ventilation will improve Outcome: Progressing

## 2024-03-23 NOTE — Plan of Care (Signed)
  Problem: Education: Goal: Knowledge of General Education information will improve Description: Including pain rating scale, medication(s)/side effects and non-pharmacologic comfort measures Outcome: Progressing   Problem: Clinical Measurements: Goal: Ability to maintain clinical measurements within normal limits will improve Outcome: Progressing Goal: Diagnostic test results will improve Outcome: Progressing Goal: Respiratory complications will improve Outcome: Progressing   Problem: Activity: Goal: Risk for activity intolerance will decrease Outcome: Progressing   Problem: Nutrition: Goal: Adequate nutrition will be maintained Outcome: Progressing   Problem: Coping: Goal: Level of anxiety will decrease Outcome: Progressing   Problem: Pain Managment: Goal: General experience of comfort will improve and/or be controlled Outcome: Progressing   Problem: Activity: Goal: Ability to tolerate increased activity will improve Outcome: Progressing

## 2024-03-23 NOTE — Progress Notes (Signed)
 PROGRESS NOTE    Patient: Travis Lawrence                            PCP: Francenia Ingle, NP                    DOB: September 11, 1956            DOA: 03/17/2024 VOZ:[id number]             DOS: 03/23/2024, 10:33 AM   LOS: 5 days   Date of Service: The patient was seen and examined on 03/23/2024  Subjective:   The patient was seen and examined this morning, again with ambulation on 3 L of oxygen desatted to 85% Complaining of shortness of breath especially with exertion but denies any chest pain   Brief Narrative:    Travis Lawrence is a 68 y.o. male with medical history significant for advanced emphysema and schizophrenia who presents with shortness of breath.   Patient reports that he recently developed some sinus congestion and cough, and then worsening shortness of breath beginning roughly 4 days ago.  His shortness of breath and productive cough have steadily worsened while the upper respiratory symptoms have improved.  He denies chest pain, fever, or chills.   ED Course: Upon arrival to the ED, patient is found to be afebrile and saturating upper 80s on room air with normal HR and elevated BP.  Labs are most notable for sodium 131, normal creatinine, normal WBC, normal troponin, and negative respiratory virus panel.   CTA chest is negative for PE or acute airspace disease but notable for advanced emphysema.   Patient was placed on supplemental oxygen and treated with IV steroids and multiple short acting nebulized bronchodilators.    Assessment & Plan:   Principal Problem:   COPD with acute exacerbation (HCC) Active Problems:   Paranoid schizophrenia (HCC)   Acute respiratory failure with hypoxia (HCC)     COPD exacerbation; acute hypoxic respiratory failure  - Persistent hypoxic respiratory failure, with minimal exertion even on 3 L desatted to 85%  - Respiratory therapist, continue bronchodilator breathing treatment with Atrovent/Xopenex every 4 hours, continue  Pulmicort nebs, C  - Will continue high-dose IV steroids 80 >>>> 40 mg t will taper down slowly -Trial of diuretics x 1   -Goal O2 goal O2 sat 88-92% - Clear the patient needs home O2-will be arranged  -IV empiric antibiotics Rocephin , will switch to p.o. Levaquin      Schizophrenia  Mood stable, - Managed with Haldol decanoate injections   Hypertension -Improved BP on Norvasc -Not on any antihypertensive medications - 5/23 started Norvasc -Utilizing as needed hydralazine  Hyperlipidemia - continue statins  Chronic tobacco abuse - Measures of quitting tobacco has been discussed with patient in detail - Accepted NicoDerm patch       ------------------------------------------------------------------------------------------------------------------- Nutritional status:  The patient's BMI is: Body mass index is 23.22 kg/m. I agree with the assessment and plan as outlined   ------------------------------------------------------------------------------------------------------------------------------------------------  DVT prophylaxis:  enoxaparin (LOVENOX) injection 40 mg Start: 03/18/24 1000   Code Status:   Code Status: Full Code  Family Communication: No family member present at bedside-  Discussed with patient -Advance care planning has been discussed.   Admission status:   Status is: Inpatient Remains inpatient appropriate because: On supplemental oxygen, needing  breathing treatment, IV steroids   Disposition: From  - home  Planning for discharge in 1-2  days: to Home   Procedures:   No admission procedures for hospital encounter.   Antimicrobials:  Anti-infectives (From admission, onward)    Start     Dose/Rate Route Frequency Ordered Stop   03/21/24 1245  levofloxacin  (LEVAQUIN ) tablet 500 mg        500 mg Oral Daily 03/21/24 1146     03/18/24 0300  cefTRIAXone  (ROCEPHIN ) 1 g in sodium chloride  0.9 % 100 mL IVPB  Status:  Discontinued         1 g 200 mL/hr over 30 Minutes Intravenous Every 24 hours 03/18/24 0218 03/21/24 1146        Medication:   amLODipine  10 mg Oral Daily   aspirin EC  81 mg Oral Daily   budesonide (PULMICORT) nebulizer solution  0.5 mg Nebulization BID   enoxaparin (LOVENOX) injection  40 mg Subcutaneous Q24H   guaiFENesin  15 mL Oral Q8H   ipratropium  0.5 mg Nebulization QID   levalbuterol  0.63 mg Nebulization QID   levofloxacin   500 mg Oral Daily   methylPREDNISolone (SOLU-MEDROL) injection  40 mg Intravenous Q12H   nicotine   14 mg Transdermal Daily   rosuvastatin   10 mg Oral Daily    acetaminophen  **OR** acetaminophen , ondansetron  **OR** ondansetron  (ZOFRAN ) IV, polyethylene glycol   Objective:   Vitals:   03/22/24 2013 03/23/24 0631 03/23/24 0940 03/23/24 1024  BP: 123/83 127/88    Pulse: 88 78    Resp: (!) 22 16    Temp: 98.2 F (36.8 C) (!) 97.5 F (36.4 C)    TempSrc:      SpO2: 94% 92% (!) 85% 94%  Weight:      Height:        Intake/Output Summary (Last 24 hours) at 03/23/2024 1033 Last data filed at 03/22/2024 1700 Gross per 24 hour  Intake 600 ml  Output --  Net 600 ml   Filed Weights   03/20/24 0536  Weight: 70.3 kg     Physical examination:     General:  AAO x 3,  cooperative, no distress;   HEENT:  Normocephalic, PERRL, otherwise with in Normal limits   Neuro:  CNII-XII intact. , normal motor and sensation, reflexes intact   Lungs:   Positive breath sounds diffusely, with diffuse wheezing and rhonchi mild improvement, lower lobe crackles  Cardio:    S1/S2, RRR, No murmure, No Rubs or Gallops   Abdomen:  Soft, non-tender, bowel sounds active all four quadrants, no guarding or peritoneal signs.  Muscular  skeletal:  Limited exam -global generalized weaknesses - in bed, able to move all 4 extremities,   2+ pulses,  symmetric, No pitting edema  Skin:  Dry, warm to touch, negative for any Rashes,  Wounds: Please see nursing documentation      --------------------------------------------------------------------------------------------------------    LABs:     Latest Ref Rng & Units 03/18/2024    5:44 AM 03/17/2024    9:09 PM 03/12/2024    3:36 PM  CBC  WBC 4.0 - 10.5 K/uL 5.6  8.5  7.8   Hemoglobin 13.0 - 17.0 g/dL 16.1  09.6  04.5   Hematocrit 39.0 - 52.0 % 47.9  50.0  49.2   Platelets 150 - 400 K/uL 213  254  272.0       Latest Ref Rng & Units 03/20/2024    5:10 AM 03/19/2024    5:51 AM 03/18/2024    5:44 AM  CMP  Glucose  70 - 99 mg/dL 811  914  782   BUN 8 - 23 mg/dL 20  14  8    Creatinine 0.61 - 1.24 mg/dL 9.56  2.13  0.86   Sodium 135 - 145 mmol/L 135  132  135   Potassium 3.5 - 5.1 mmol/L 4.2  4.4  4.1   Chloride 98 - 111 mmol/L 100  98  101   CO2 22 - 32 mmol/L 28  28  24    Calcium  8.9 - 10.3 mg/dL 8.5  8.8  9.2        Micro Results Recent Results (from the past 240 hours)  Resp panel by RT-PCR (RSV, Flu A&B, Covid) Anterior Nasal Swab     Status: None   Collection Time: 03/17/24 11:11 PM   Specimen: Anterior Nasal Swab  Result Value Ref Range Status   SARS Coronavirus 2 by RT PCR NEGATIVE NEGATIVE Final    Comment: (NOTE) SARS-CoV-2 target nucleic acids are NOT DETECTED.  The SARS-CoV-2 RNA is generally detectable in upper respiratory specimens during the acute phase of infection. The lowest concentration of SARS-CoV-2 viral copies this assay can detect is 138 copies/mL. A negative result does not preclude SARS-Cov-2 infection and should not be used as the sole basis for treatment or other patient management decisions. A negative result may occur with  improper specimen collection/handling, submission of specimen other than nasopharyngeal swab, presence of viral mutation(s) within the areas targeted by this assay, and inadequate number of viral copies(<138 copies/mL). A negative result must be combined with clinical observations, patient history, and epidemiological information. The expected  result is Negative.  Fact Sheet for Patients:  BloggerCourse.com  Fact Sheet for Healthcare Providers:  SeriousBroker.it  This test is no t yet approved or cleared by the [place]  FDA and  has been authorized for detection and/or diagnosis of SARS-CoV-2 by FDA under an Emergency Use Authorization (EUA). This EUA will remain  in effect (meaning this test can be used) for the duration of the COVID-19 declaration under Section 564(b)(1) of the Act, 21 U.S.C.section 360bbb-3(b)(1), unless the authorization is terminated  or revoked sooner.       Influenza A by PCR NEGATIVE NEGATIVE Final   Influenza B by PCR NEGATIVE NEGATIVE Final    Comment: (NOTE) The Xpert Xpress SARS-CoV-2/FLU/RSV plus assay is intended as an aid in the diagnosis of influenza from Nasopharyngeal swab specimens and should not be used as a sole basis for treatment. Nasal washings and aspirates are unacceptable for Xpert Xpress SARS-CoV-2/FLU/RSV testing.  Fact Sheet for Patients: BloggerCourse.com  Fact Sheet for Healthcare Providers: SeriousBroker.it  This test is not yet approved or cleared by the [place]  FDA and has been authorized for detection and/or diagnosis of SARS-CoV-2 by FDA under an Emergency Use Authorization (EUA). This EUA will remain in effect (meaning this test can be used) for the duration of the COVID-19 declaration under Section 564(b)(1) of the Act, 21 U.S.C. section 360bbb-3(b)(1), unless the authorization is terminated or revoked.     Resp Syncytial Virus by PCR NEGATIVE NEGATIVE Final    Comment: (NOTE) Fact Sheet for Patients: BloggerCourse.com  Fact Sheet for Healthcare Providers: SeriousBroker.it  This test is not yet approved or cleared by the [place]  FDA and has been authorized for detection and/or diagnosis of  SARS-CoV-2 by FDA under an Emergency Use Authorization (EUA). This EUA will remain in effect (meaning this test can be used) for the duration of the COVID-19 declaration under Section 564(b)(1)  of the Act, 21 U.S.C. section 360bbb-3(b)(1), unless the authorization is terminated or revoked.  Performed at [facility], [address], [place], [place] [address]   Expectorated Sputum Assessment w Gram Stain, Rflx to Resp Cult     Status: None   Collection Time: 03/18/24  4:40 AM   Specimen: Sputum  Result Value Ref Range Status   Specimen Description SPUTUM  Final   Special Requests NONE  Final   Sputum evaluation   Final    THIS SPECIMEN IS ACCEPTABLE FOR SPUTUM CULTURE Performed at [facility], [address], [place], [place] [address]    Report Status 03/18/2024 FINAL  Final  Culture, Respiratory w Gram Stain     Status: None   Collection Time: 03/18/24  4:40 AM   Specimen: SPU  Result Value Ref Range Status   Specimen Description   Final    SPUTUM Performed at [facility], [address], [place], [place] [address]    Special Requests   Final    NONE Reflexed from [id number] Performed at [facility], [address] [place], [place] [address]    Gram Stain   Final    FEW WBC PRESENT, PREDOMINANTLY PMN MODERATE GRAM POSITIVE COCCI    Culture   Final    ABUNDANT MORAXELLA CATARRHALIS(BRANHAMELLA) BETA LACTAMASE POSITIVE Performed at [facility], [address] [place], [place] [address]    Report Status 03/20/2024 FINAL  Final    Radiology Reports No results found.    SIGNED: Bobbetta Burnet, MD, FHM. FAAFP. Arlin Benes - [facility] Time spent - 55 min.  In seeing, evaluating and examining the patient. Reviewing medical records, labs, drawn plan of care. [facility],  Pager (please use [email or web address] to page/ text) Please use Epic Secure Chat for non-urgent communication  (7AM-7PM)  If 7PM-7AM, please contact night-coverage [email or web address], 03/23/2024, 10:33 AM

## 2024-03-24 ENCOUNTER — Ambulatory Visit: Payer: Medicare HMO | Admitting: Acute Care

## 2024-03-24 ENCOUNTER — Other Ambulatory Visit (HOSPITAL_COMMUNITY): Payer: Self-pay

## 2024-03-24 ENCOUNTER — Other Ambulatory Visit: Payer: Self-pay

## 2024-03-24 DIAGNOSIS — J441 Chronic obstructive pulmonary disease with (acute) exacerbation: Secondary | ICD-10-CM | POA: Diagnosis not present

## 2024-03-24 LAB — BASIC METABOLIC PANEL WITH GFR
Anion gap: 7 (ref 5–15)
Anion gap: 8 (ref 5–15)
BUN: 20 mg/dL (ref 8–23)
BUN: 20 mg/dL (ref 8–23)
CO2: 28 mmol/L (ref 22–32)
CO2: 29 mmol/L (ref 22–32)
Calcium: 8.2 mg/dL — ABNORMAL LOW (ref 8.9–10.3)
Calcium: 8.9 mg/dL (ref 8.9–10.3)
Chloride: 93 mmol/L — ABNORMAL LOW (ref 98–111)
Chloride: 92 mmol/L — ABNORMAL LOW (ref 98–111)
Creatinine, Ser: 0.91 mg/dL (ref 0.61–1.24)
Creatinine, Ser: 1.02 mg/dL (ref 0.61–1.24)
GFR, Estimated: >60 mL/min (ref >60)
GFR, Estimated: >60 mL/min (ref >60)
Glucose, Bld: 146 mg/dL — ABNORMAL HIGH (ref 70–99)
Glucose, Bld: 151 mg/dL — ABNORMAL HIGH (ref 70–99)
Potassium: 4.4 mmol/L (ref 3.5–5.1)
Potassium: 3.7 mmol/L (ref 3.5–5.1)
Sodium: 128 mmol/L — ABNORMAL LOW (ref 135–145)
Sodium: 129 mmol/L — ABNORMAL LOW (ref 135–145)

## 2024-03-24 MED ORDER — NICOTINE 14 MG/24HR TD PT24
14.0000 mg | MEDICATED_PATCH | Freq: Every day | TRANSDERMAL | 0 refills | Status: DC
  Filled 2024-03-24: qty 28, 28d supply, fill #0

## 2024-03-24 MED ORDER — LEVALBUTEROL HCL 0.63 MG/3ML IN NEBU
0.6300 mg | INHALATION_SOLUTION | Freq: Three times a day (TID) | RESPIRATORY_TRACT | Status: DC
  Administered 2024-03-24: 0.63 mg via RESPIRATORY_TRACT
  Filled 2024-03-24: qty 3

## 2024-03-24 MED ORDER — AMLODIPINE BESYLATE 10 MG PO TABS
10.0000 mg | ORAL_TABLET | Freq: Every day | ORAL | 0 refills | Status: DC
  Filled 2024-03-24: qty 30, 30d supply, fill #0

## 2024-03-24 MED ORDER — GUAIFENESIN 100 MG/5ML PO LIQD
15.0000 mL | Freq: Three times a day (TID) | ORAL | 0 refills | Status: AC
  Filled 2024-03-24: qty 120, 3d supply, fill #0

## 2024-03-24 MED ORDER — LEVALBUTEROL HCL 0.63 MG/3ML IN NEBU: 0.6300 mg | INHALATION_SOLUTION | Freq: Three times a day (TID) | RESPIRATORY_TRACT | Status: DC

## 2024-03-24 MED ORDER — IPRATROPIUM BROMIDE 0.02 % IN SOLN
0.5000 mg | Freq: Three times a day (TID) | RESPIRATORY_TRACT | Status: DC
  Administered 2024-03-24: 0.5 mg via RESPIRATORY_TRACT
  Filled 2024-03-24: qty 2.5

## 2024-03-24 MED ORDER — LEVOFLOXACIN 500 MG PO TABS
500.0000 mg | ORAL_TABLET | Freq: Every day | ORAL | 0 refills | Status: AC
  Filled 2024-03-24: qty 3, 3d supply, fill #0

## 2024-03-24 MED ORDER — IPRATROPIUM BROMIDE 0.02 % IN SOLN
0.5000 mg | Freq: Four times a day (QID) | RESPIRATORY_TRACT | 12 refills | Status: DC
  Filled 2024-03-24: qty 75, 8d supply, fill #0

## 2024-03-24 MED ORDER — LEVALBUTEROL HCL 0.63 MG/3ML IN NEBU
0.6300 mg | INHALATION_SOLUTION | Freq: Four times a day (QID) | RESPIRATORY_TRACT | 12 refills | Status: DC
  Filled 2024-03-24: qty 75, 7d supply, fill #0

## 2024-03-24 MED ORDER — IPRATROPIUM BROMIDE 0.02 % IN SOLN: 0.5000 mg | Freq: Three times a day (TID) | RESPIRATORY_TRACT | Status: DC

## 2024-03-24 MED ORDER — SODIUM CHLORIDE 1 G PO TABS
1.0000 g | ORAL_TABLET | Freq: Two times a day (BID) | ORAL | Status: DC
  Administered 2024-03-24: 1 g via ORAL
  Filled 2024-03-24: qty 1

## 2024-03-24 MED ORDER — METHYLPREDNISOLONE 4 MG PO TBPK
ORAL_TABLET | ORAL | 0 refills | Status: DC
  Filled 2024-03-24: qty 21, 6d supply, fill #0

## 2024-03-24 MED ORDER — PREDNISONE 10 MG PO TABS
60.0000 mg | ORAL_TABLET | Freq: Every day | ORAL | Status: DC
  Administered 2024-03-24: 60 mg via ORAL
  Filled 2024-03-24: qty 1

## 2024-03-24 NOTE — Discharge Summary (Signed)
 Physician Discharge Summary   Patient: Travis Lawrence MRN: [id number] DOB: Mar 14, 1956  Admit date:     03/17/2024  Discharge date: 03/24/24  Discharge Physician: Bobbetta Burnet   PCP: Francenia Ingle, NP   Recommendations at discharge:    Follow with the PCP in 1-2 weeks A referral to pulmonologist-to follow-up in 2-4 weeks Continue either nebulizer or inhalers every 4 hours Continue prednisone taper Avoid smoking-continue NicoDerm patch  Discharge Diagnoses: Principal Problem:   COPD with acute exacerbation (HCC) Active Problems:   Paranoid schizophrenia (HCC)   Acute respiratory failure with hypoxia (HCC)  Resolved Problems:   * No resolved hospital problems. *  Hospital Course:  Travis Lawrence is a 68 y.o. male with medical history significant for advanced emphysema and schizophrenia who presents with shortness of breath.   Patient reports that he recently developed some sinus congestion and cough, and then worsening shortness of breath beginning roughly 4 days ago.  His shortness of breath and productive cough have steadily worsened while the upper respiratory symptoms have improved.  He denies chest pain, fever, or chills.   ED Course:ED, patient is found to be afebrile and saturating upper 80s on room air with normal HR and elevated BP.  Labs are most notable for sodium 131, normal creatinine, normal WBC, normal troponin, and negative respiratory virus panel.   -  CTA chest is negative for PE or acute airspace disease but notable for advanced emphysema. ===================================================================  COPD exacerbation; acute hypoxic respiratory failure  - Persistent hypoxic respiratory failure, - It appears that he desats with minimal exertion  -Patient is to continue 3 L of oxygen, currently satting 92% - He may go up to 4-5 L with exertion - Discontinued IV steroids, prednisone taper initiated - Continue mucolytics - Continue  incentive spirometer, flutter valve - Patient's home medication of trilogy and albuterol  was represcribed along with nebs nebulizer machine with medication of Atrovent, Xopenex every 4-6 hours for exacerbation-progressive shortness of breath and hypoxia       - Will continue high-dose IV steroids 80 >>>> switch to p.o. prednisone taper -Trial of diuretics Lasix, with minimal changes   -Goal O2 goal O2 sat 88-92% - Clear the patient needs home O2-will be arranged   -IV empiric antibiotics Rocephin , will switch to p.o. Levaquin       Schizophrenia  Mood stable, - Managed with Haldol decanoate injections    Hypertension - Started patient on Norvasc, BP stabilized   Hyperlipidemia - continue statins   Chronic tobacco abuse - Measures of quitting tobacco has been discussed with patient in detail - Accepted NicoDerm patch  Transient hyponatremia, serum sodium is low at 128-stable  Disposition: Home Diet recommendation:  Discharge Diet Orders (From admission, onward)     Start     Ordered   03/24/24 0000  Diet - low sodium heart healthy        03/24/24 0855           Regular diet DISCHARGE MEDICATION: Allergies as of 03/24/2024       Reactions   Penicillins Hives        Medication List     TAKE these medications    albuterol  108 (90 Base) MCG/ACT inhaler Commonly known as: VENTOLIN  HFA TAKE 2 PUFFS BY MOUTH EVERY 6 HOURS AS NEEDED FOR WHEEZE OR SHORTNESS OF BREATH   amLODipine 10 MG tablet Commonly known as: NORVASC Take 1 tablet (10 mg total) by mouth daily.   aspirin EC  81 MG tablet Take 81 mg by mouth daily. Swallow whole.   guaiFENesin 100 MG/5ML liquid Commonly known as: ROBITUSSIN Take 15 mLs by mouth every 8 (eight) hours.   haloperidol decanoate 100 MG/ML injection Commonly known as: HALDOL DECANOATE Inject 100 mg into the muscle every 28 (twenty-eight) days.   ipratropium 0.02 % nebulizer solution Commonly known as: ATROVENT Take 2.5 mLs  (0.5 mg total) by nebulization 4 (four) times daily.   levalbuterol 0.63 MG/3ML nebulizer solution Commonly known as: XOPENEX Take 3 mLs (0.63 mg total) by nebulization 4 (four) times daily.   levofloxacin  500 MG tablet Commonly known as: LEVAQUIN  Take 1 tablet (500 mg total) by mouth daily for 3 days.   methylPREDNISolone 4 MG Tbpk tablet Commonly known as: MEDROL DOSEPAK Medrol Dosepak take as instructed   nicotine  14 mg/24hr patch Commonly known as: NICODERM CQ  - dosed in mg/24 hours Place 1 patch (14 mg total) onto the skin daily.   PreserVision AREDS 2 Caps TAKE 1 CAPSULE BY MOUTH TWICE A DAY   rosuvastatin  10 MG tablet Commonly known as: CRESTOR  TAKE 1 TABLET BY MOUTH EVERYDAY AT BEDTIME   Trelegy Ellipta  100-62.5-25 MCG/ACT Aepb Generic drug: Fluticasone -Umeclidin-Vilant Inhale 1 puff into the lungs daily.               Durable Medical Equipment  (From admission, onward)           Start     Ordered   03/24/24 0000  For home use only DME Nebulizer machine       Question Answer Comment  Patient needs a nebulizer to treat with the following condition COPD (chronic obstructive pulmonary disease) (HCC)   Length of Need 12 Months   Additional equipment included Administration kit      03/24/24 0855   03/23/24 1035  For home use only DME oxygen  Once       Question Answer Comment  Length of Need 12 Months   Mode or (Route) Nasal cannula   Liters per Minute 4   Frequency Continuous (stationary and portable oxygen unit needed)   Oxygen conserving device Yes   Oxygen delivery system Gas      03/23/24 1034            Discharge Exam: Filed Weights   03/20/24 0536  Weight: 70.3 kg        General:  AAO x 3,  cooperative, no distress;   HEENT:  Normocephalic, PERRL, otherwise with in Normal limits   Neuro:  CNII-XII intact. , normal motor and sensation, reflexes intact   Lungs:   Clear to auscultation BL, Respirations unlabored, diffuse but  improved wheezes and rhonchi  - no crackles  Cardio:    S1/S2, RRR, No murmure, No Rubs or Gallops   Abdomen:  Soft, non-tender, bowel sounds active all four quadrants, no guarding or peritoneal signs.  Muscular  skeletal:  Limited exam -global generalized weaknesses - in bed, able to move all 4 extremities,   2+ pulses,  symmetric, No pitting edema  Skin:  Dry, warm to touch, negative for any Rashes,  Wounds: Please see nursing documentation          Condition at discharge: fair  The results of significant diagnostics from this hospitalization (including imaging, microbiology, ancillary and laboratory) are listed below for reference.   Imaging Studies: DG Chest 2 View Result Date: 03/21/2024 CLINICAL DATA:  Shortness of breath and what cough for 4 days. History of previous lung biopsy.  EXAM: CHEST - 2 VIEW COMPARISON:  X-ray and CTA 03/17/2024. FINDINGS: Hyperinflation. No consolidation, pneumothorax or effusion. Chronic lung changes identified. Normal cardiopericardial silhouette with calcified aorta. Biopsy clip along the medial right upper lung. Degenerative changes along the spine. Please correlate with recent CTA. IMPRESSION: Hyperinflation with chronic changes. Electronically Signed   By: Adrianna Horde M.D.   On: 03/21/2024 16:11   CT Angio Chest PE W and/or Wo Contrast Result Date: 03/17/2024 CLINICAL DATA:  Shortness of breath EXAM: CT ANGIOGRAPHY CHEST WITH CONTRAST TECHNIQUE: Multidetector CT imaging of the chest was performed using the standard protocol during bolus administration of intravenous contrast. Multiplanar CT image reconstructions and MIPs were obtained to evaluate the vascular anatomy. RADIATION DOSE REDUCTION: This exam was performed according to the departmental dose-optimization program which includes automated exposure control, adjustment of the mA and/or kV according to patient size and/or use of iterative reconstruction technique. CONTRAST:  75mL OMNIPAQUE   IOHEXOL  350 MG/ML SOLN COMPARISON:  CT 02/01/2024 trauma 10/21/2023, PET CT 11/26/2023 FINDINGS: Cardiovascular: Satisfactory opacification of the pulmonary arteries to the segmental level. No evidence of pulmonary embolism. Mild aortic atherosclerosis. No aneurysm. Coronary vascular calcification. Normal cardiac size. Trace pericardial effusion Mediastinum/Nodes: Patent trachea. No suspicious thyroid  mass. Borderline precarinal nodes measuring up to 10 mm. Esophagus within normal limits Lungs/Pleura: Advanced emphysema. No acute airspace disease, pleural effusion, or pneumothorax. Right upper lobe nodule appears stable and measures about 18 x 5 mm on series 12, image 43, adjacent fiducial marker. Right middle lobe 5 mm pulmonary nodule on series 12 image 102 also similar. Subpleural lingular nodule measures 7 mm on series 12, image 12, also unchanged. No new suspicious lung nodule Upper Abdomen: No acute finding Musculoskeletal: No acute or suspicious osseous abnormality. Multilevel degenerative changes Review of the MIP images confirms the above findings. IMPRESSION: 1. Negative for acute pulmonary embolus or aortic dissection. 2. Advanced emphysema. Stable bilateral pulmonary nodules. No acute airspace disease 3. Aortic atherosclerosis. Aortic Atherosclerosis (ICD10-I70.0) and Emphysema (ICD10-J43.9). Electronically Signed   By: Esmeralda Hedge M.D.   On: 03/17/2024 23:07   DG Chest 2 View Result Date: 03/17/2024 CLINICAL DATA:  Shortness of breath EXAM: CHEST - 2 VIEW COMPARISON:  Chest x-ray 12/17/2023.  Chest CT 02/01/2024 FINDINGS: Emphysematous changes are again seen. There is no lung consolidation, pleural effusion or pneumothorax. Questionable small nodular density seen in the left hilar region measuring 8 mm. The cardiomediastinal silhouette is within normal limits. No acute fractures are seen. IMPRESSION: 1. No active cardiopulmonary disease. 2. Emphysematous changes. 3. Questionable small nodular  density in the left hilar region measuring 8 mm. Recommend further evaluation with nonemergent chest CT. Electronically Signed   By: Tyron Gallon M.D.   On: 03/17/2024 21:34    Microbiology: Results for orders placed or performed during the hospital encounter of 03/17/24  Resp panel by RT-PCR (RSV, Flu A&B, Covid) Anterior Nasal Swab     Status: None   Collection Time: 03/17/24 11:11 PM   Specimen: Anterior Nasal Swab  Result Value Ref Range Status   SARS Coronavirus 2 by RT PCR NEGATIVE NEGATIVE Final    Comment: (NOTE) SARS-CoV-2 target nucleic acids are NOT DETECTED.  The SARS-CoV-2 RNA is generally detectable in upper respiratory specimens during the acute phase of infection. The lowest concentration of SARS-CoV-2 viral copies this assay can detect is 138 copies/mL. A negative result does not preclude SARS-Cov-2 infection and should not be used as the sole basis for treatment or other patient management decisions.  A negative result may occur with  improper specimen collection/handling, submission of specimen other than nasopharyngeal swab, presence of viral mutation(s) within the areas targeted by this assay, and inadequate number of viral copies(<138 copies/mL). A negative result must be combined with clinical observations, patient history, and epidemiological information. The expected result is Negative.  Fact Sheet for Patients:  BloggerCourse.com  Fact Sheet for Healthcare Providers:  SeriousBroker.it  This test is no t yet approved or cleared by the [place]  FDA and  has been authorized for detection and/or diagnosis of SARS-CoV-2 by FDA under an Emergency Use Authorization (EUA). This EUA will remain  in effect (meaning this test can be used) for the duration of the COVID-19 declaration under Section 564(b)(1) of the Act, 21 U.S.C.section 360bbb-3(b)(1), unless the authorization is terminated  or revoked sooner.        Influenza A by PCR NEGATIVE NEGATIVE Final   Influenza B by PCR NEGATIVE NEGATIVE Final    Comment: (NOTE) The Xpert Xpress SARS-CoV-2/FLU/RSV plus assay is intended as an aid in the diagnosis of influenza from Nasopharyngeal swab specimens and should not be used as a sole basis for treatment. Nasal washings and aspirates are unacceptable for Xpert Xpress SARS-CoV-2/FLU/RSV testing.  Fact Sheet for Patients: BloggerCourse.com  Fact Sheet for Healthcare Providers: SeriousBroker.it  This test is not yet approved or cleared by the [place]  FDA and has been authorized for detection and/or diagnosis of SARS-CoV-2 by FDA under an Emergency Use Authorization (EUA). This EUA will remain in effect (meaning this test can be used) for the duration of the COVID-19 declaration under Section 564(b)(1) of the Act, 21 U.S.C. section 360bbb-3(b)(1), unless the authorization is terminated or revoked.     Resp Syncytial Virus by PCR NEGATIVE NEGATIVE Final    Comment: (NOTE) Fact Sheet for Patients: BloggerCourse.com  Fact Sheet for Healthcare Providers: SeriousBroker.it  This test is not yet approved or cleared by the [place]  FDA and has been authorized for detection and/or diagnosis of SARS-CoV-2 by FDA under an Emergency Use Authorization (EUA). This EUA will remain in effect (meaning this test can be used) for the duration of the COVID-19 declaration under Section 564(b)(1) of the Act, 21 U.S.C. section 360bbb-3(b)(1), unless the authorization is terminated or revoked.  Performed at [facility], [address] [place], [place] [address]   Expectorated Sputum Assessment w Gram Stain, Rflx to Resp Cult     Status: None   Collection Time: 03/18/24  4:40 AM   Specimen: Sputum  Result Value Ref Range Status   Specimen Description SPUTUM  Final    Special Requests NONE  Final   Sputum evaluation   Final    THIS SPECIMEN IS ACCEPTABLE FOR SPUTUM CULTURE Performed at [facility], [address] [place], [place] [address]    Report Status 03/18/2024 FINAL  Final  Culture, Respiratory w Gram Stain     Status: None   Collection Time: 03/18/24  4:40 AM   Specimen: SPU  Result Value Ref Range Status   Specimen Description   Final    SPUTUM Performed at [facility], [address], [place], [place] [address]    Special Requests   Final    NONE Reflexed from [id number] Performed at [facility], [address], [place], [place] [address]    Gram Stain   Final    FEW WBC PRESENT, PREDOMINANTLY PMN MODERATE GRAM POSITIVE COCCI    Culture  Final    ABUNDANT MORAXELLA CATARRHALIS(BRANHAMELLA) BETA LACTAMASE POSITIVE Performed at [facility], [address] [place], [place] [address]    Report Status 03/20/2024 FINAL  Final    Labs: CBC: Recent Labs  Lab 03/17/24 2109 03/18/24 0544  WBC 8.5 5.6  HGB 16.8 16.4  HCT 50.0 47.9  MCV 96.5 95.2  PLT 254 213   Basic Metabolic Panel: Recent Labs  Lab 03/17/24 2109 03/18/24 0544 03/19/24 0551 03/20/24 0510 03/24/24 0439  NA 131* 135 132* 135 128*  K 3.9 4.1 4.4 4.2 4.4  CL 95* 101 98 100 93*  CO2 25 24 28 28 28   GLUCOSE 115* 148* 152* 101* 146*  BUN 8 8 14 20 20   CREATININE 0.87 0.69 0.85 0.82 0.91  CALCIUM  8.9 9.2 8.8* 8.5* 8.2*   Liver Function Tests: No results for input(s): "AST", "ALT", "ALKPHOS", "BILITOT", "PROT", "ALBUMIN" in the last 168 hours. CBG: Recent Labs  Lab 03/23/24 2048  GLUCAP 140*    Discharge time spent: greater than 30 minutes.  Signed: Bobbetta Burnet, MD [facility] 03/24/2024

## 2024-03-24 NOTE — Care Management Important Message (Signed)
 Important Message  Patient Details  Name: Travis Lawrence MRN: [id number] Date of Birth: 22-May-1956   Important Message Given:  Yes - Medicare IM (reviewed letter with [name] at [phone])     Neila Bally 03/24/2024, 2:08 PM

## 2024-03-24 NOTE — Plan of Care (Signed)
  Problem: Education: Goal: Knowledge of General Education information will improve Description: Including pain rating scale, medication(s)/side effects and non-pharmacologic comfort measures Outcome: Progressing   Problem: Health Behavior/Discharge Planning: Goal: Ability to manage health-related needs will improve Outcome: Progressing   Problem: Clinical Measurements: Goal: Ability to maintain clinical measurements within normal limits will improve Outcome: Progressing Goal: Will remain free from infection Outcome: Progressing Goal: Diagnostic test results will improve Outcome: Progressing Goal: Respiratory complications will improve Outcome: Progressing Goal: Cardiovascular complication will be avoided Outcome: Progressing   Problem: Coping: Goal: Level of anxiety will decrease Outcome: Progressing   Problem: Elimination: Goal: Will not experience complications related to bowel motility Outcome: Progressing Goal: Will not experience complications related to urinary retention Outcome: Progressing   Problem: Pain Managment: Goal: General experience of comfort will improve and/or be controlled Outcome: Progressing   Problem: Safety: Goal: Ability to remain free from injury will improve Outcome: Progressing   Problem: Skin Integrity: Goal: Risk for impaired skin integrity will decrease Outcome: Progressing   Problem: Education: Goal: Knowledge of disease or condition will improve Outcome: Progressing Goal: Knowledge of the prescribed therapeutic regimen will improve Outcome: Progressing Goal: Individualized Educational Video(s) Outcome: Progressing   Problem: Activity: Goal: Will verbalize the importance of balancing activity with adequate rest periods Outcome: Progressing   Problem: Respiratory: Goal: Ability to maintain a clear airway will improve Outcome: Progressing Goal: Levels of oxygenation will improve Outcome: Progressing Goal: Ability to maintain  adequate ventilation will improve Outcome: Progressing   Problem: Activity: Goal: Risk for activity intolerance will decrease Outcome: Not Progressing   Problem: Nutrition: Goal: Adequate nutrition will be maintained Outcome: Not Progressing   Problem: Activity: Goal: Ability to tolerate increased activity will improve Outcome: Not Progressing

## 2024-03-24 NOTE — Progress Notes (Addendum)
 SATURATION QUALIFICATIONS: (This note is used to comply with regulatory documentation for home oxygen)  Patient Saturations on Room Air at Rest = 90%  Patient Saturations on Room Air while Ambulating = 87%  Patient Saturations on 3 Liters of oxygen while Ambulating = 94%

## 2024-03-24 NOTE — TOC Transition Note (Signed)
 Transition of Care [facility]) - Discharge Note   Patient Details  Name: Travis Lawrence MRN: [id number] Date of Birth: 03-26-1956  Transition of [facility]) CM/SW Contact:  Marty Sleet, LCSW Phone Number: 03/24/2024, 10:34 AM   Clinical Narrative:    Pt with new O2 requirement. Met with pt and confirmed need for home O2. Pt agreeable and does not have preference for DME agency. Nebulizer machine and 3L home O2 ordered through [employer] [facility]. O2 to be delivered to pt's room prior to discharge. No further TOC needs identified.    Final next level of care: Home/Self Care Barriers to Discharge: No Barriers Identified   Patient Goals and CMS Choice Patient states their goals for this hospitalization and ongoing recovery are:: To return home CMS Medicare.gov Compare Post Acute Care list provided to:: Patient Choice offered to / list presented to : Patient      Discharge Placement                       Discharge Plan and Services Additional resources added to the After Visit Summary for                  DME Arranged: Oxygen, Nebulizer machine DME Agency: [employer] Date DME Agency Contacted: 03/24/24 Time DME Agency Contacted: 1034 Representative spoke with at DME Agency: Mitch            Social Drivers of Health (SDOH) Interventions SDOH Screenings   Food Insecurity: No Food Insecurity (03/18/2024)  Housing: Low Risk  (03/18/2024)  Transportation Needs: No Transportation Needs (03/18/2024)  Utilities: Not At Risk (03/18/2024)  Alcohol Screen: Low Risk  (07/10/2023)  Depression (PHQ2-9): Low Risk  (03/12/2024)  Financial Resource Strain: Low Risk  (07/10/2023)  Physical Activity: Insufficiently Active (07/10/2023)  Social Connections: Socially Isolated (03/18/2024)  Stress: No Stress Concern Present (07/10/2023)  Tobacco Use: High Risk (03/18/2024)  Health Literacy: Adequate Health Literacy (07/10/2023)     Readmission Risk Interventions    03/18/2024   12:20 PM   Readmission Risk Prevention Plan  Post Dischage Appt Complete  Medication Screening Complete  Transportation Screening Complete

## 2024-03-24 NOTE — Progress Notes (Signed)
 Discharge mediations delivered to patient at bedside [facility]

## 2024-03-25 ENCOUNTER — Telehealth: Payer: Self-pay

## 2024-03-25 DIAGNOSIS — J9601 Acute respiratory failure with hypoxia: Secondary | ICD-10-CM | POA: Diagnosis not present

## 2024-03-25 DIAGNOSIS — J441 Chronic obstructive pulmonary disease with (acute) exacerbation: Secondary | ICD-10-CM | POA: Diagnosis not present

## 2024-03-25 NOTE — Transitions of Care (Post Inpatient/ED Visit) (Signed)
   03/25/2024  Name: Travis Lawrence MRN: [id number] DOB: [birth year]  Today's TOC FU Call Status: Today's TOC FU Call Status:: Successful TOC FU Call Completed TOC FU Call Complete Date: 03/25/24 Patient's Name and Date of Birth confirmed.  Transition Care Management Follow-up Telephone Call Date of Discharge: 03/24/24 Discharge Facility: [facility]) Type of Discharge: Inpatient Admission  Items Reviewed: Medications obtained,verified, and reconciled?: Partial Review Completed Reason for Partial Mediation Review: Patient did not wish to review all meds just new medication  Medications Reviewed Today: Pt States only changes were has oxygen, Amlodipine, Levofloxcin, Prednisone  Medications Reviewed Today   Medications were not reviewed in this encounter     Home Care and Equipment/Supplies:  Patient states he has his oxygen     [place], RN, BSN, CCM [place]  [facility], [facility] RN Care Manager Direct Dial: [phone]

## 2024-04-06 ENCOUNTER — Ambulatory Visit: Admitting: Acute Care

## 2024-04-17 ENCOUNTER — Ambulatory Visit: Admitting: Acute Care

## 2024-04-17 ENCOUNTER — Ambulatory Visit: Payer: Self-pay | Admitting: Family Medicine

## 2024-04-17 ENCOUNTER — Other Ambulatory Visit: Payer: Self-pay | Admitting: Acute Care

## 2024-04-17 ENCOUNTER — Encounter: Payer: Self-pay | Admitting: Acute Care

## 2024-04-17 ENCOUNTER — Other Ambulatory Visit (HOSPITAL_COMMUNITY): Payer: Self-pay

## 2024-04-17 VITALS — BP 111/77 | HR 75 | Ht 71.0 in | Wt 156.2 lb

## 2024-04-17 DIAGNOSIS — R911 Solitary pulmonary nodule: Secondary | ICD-10-CM

## 2024-04-17 DIAGNOSIS — Z87891 Personal history of nicotine dependence: Secondary | ICD-10-CM | POA: Diagnosis not present

## 2024-04-17 DIAGNOSIS — J9611 Chronic respiratory failure with hypoxia: Secondary | ICD-10-CM | POA: Diagnosis not present

## 2024-04-17 DIAGNOSIS — J439 Emphysema, unspecified: Secondary | ICD-10-CM

## 2024-04-17 DIAGNOSIS — E781 Pure hyperglyceridemia: Secondary | ICD-10-CM

## 2024-04-17 DIAGNOSIS — J441 Chronic obstructive pulmonary disease with (acute) exacerbation: Secondary | ICD-10-CM

## 2024-04-17 DIAGNOSIS — F17200 Nicotine dependence, unspecified, uncomplicated: Secondary | ICD-10-CM

## 2024-04-17 MED ORDER — NICOTINE 7 MG/24HR TD PT24: 7.0000 mg | MEDICATED_PATCH | Freq: Every day | TRANSDERMAL | 0 refills | Status: DC

## 2024-04-17 NOTE — Telephone Encounter (Signed)
 [facility] called back to clarify pt needs-- pt reports that he needs a refill on all medications. Pt saw Groce, NP today and needs refill on all respiratory medications-- pt reported that Rxs were not at [facility] on file. [facility] will forward encounter for Margit Shelling, NP 's office to review and refill. Patient verbalized understanding.

## 2024-04-17 NOTE — Progress Notes (Signed)
 History of Present Illness Travis Lawrence is a 68 y.o. male current every day smoker( 41 pack year smoking history) followed through the screening program. Here for evaluation of an abnormal screening scan .He will be followed by Dara Ear NP and DrBaldwin Levee.  Synopsis Pt had a LDCT 10/20/2024 which was read as a 4 B. There was a  a new  irregular bilobed solid right upper lobe nodule measuring 17.7 mm. PET imaging was done, which showed uptake in the right upper lobe nodule. Patient was scheduled for bronchoscopy with biopsies on 12/17/2023 per Dr. Baldwin Levee.The cytology was negative for malignancy. Plan was for a 3 month follow up to ensure the RUL nodule is stable and has not grown.  He is here today to review the 70-month follow-up scan and also as a hospital follow-up for COPD.   04/17/2024 Pt. Presents for follow up after surveillance Ct Chest to ensure RUL nodule stability.He was in the hospital recently for a COPD flare. He was there for a week.  He was treated with antibiotics, steroids, nebulizer treatments, oxygen, physical therapy, and Occupational Therapy.  He was  started on Oxygen 3 L at rest and  4 L with exertion. He has quit smoking x 1 month. He is using nicotine  patches.  I am sending in a prescription for the 7 mg patches as that is his neck step down.  He had a CTA Chest done, as well as the follow up scan ordered by me 01/2024. This was negative for PE and showed no suspicious lung nodules.  The fiducial marker is represented on the imaging.  He states he has been doing better since discharge.  He is compliant with his Trelegy and Xopenex  nebs.  He is wearing his oxygen, and he is monitoring his oxygen saturations.  He understands that he should not drop below 88 to 90% at any time.  We have reviewed his most recent CT results.  The pulmonary nodule we have been following is stable.  We will return him to the lung cancer screening population starting in December 2025.  He  understands he will get a call to get his scan scheduled closer to the time.  We will do a 17-month follow-up for his COPD to ensure that he is doing well after his hospitalization.  I have reviewed the red flags for flare and he understands he is to call the office to be seen on an acute basis if he develops any of those signs or symptoms.   Test Results: 03/17/2024 CTA Chest Advanced emphysema. No acute airspace disease, pleural effusion, or pneumothorax. Right upper lobe nodule appears stable and measures about 18 x 5 mm on series 12, image 43, adjacent fiducial marker. Right middle lobe 5 mm pulmonary nodule on series 12 image 102 also similar. Subpleural lingular nodule measures 7 mm on series 12, image 12, also unchanged. No new suspicious lung nodule  IMPRESSION: 1. Negative for acute pulmonary embolus or aortic dissection. 2. Advanced emphysema. Stable bilateral pulmonary nodules. No acute airspace disease 3. Aortic atherosclerosis.   CT Chest 02/01/2024 IMPRESSION: 1. The dominant right upper lobe pulmonary nodule has decreased since 10/21/2023, favoring a benign inflammatory etiology. 2. Other pulmonary nodules of up to 7 mm are unchanged. The patient can return to lung cancer screening population on or after 10/20/2024. 3. Aortic atherosclerosis (ICD10-I70.0), coronary artery atherosclerosis and emphysema (ICD10-J43.9).     Cytology 12/17/2023 FINAL MICROSCOPIC DIAGNOSIS:  A. LUNG, RUL, FINE NEEDLE ASPIRATION  BIOPSY:  - Atypical cells present, favor reactive  - Chronic inflammation   B. LUNG, RUL, BRUSHING:  - Atypical cells present, favor reactive    BAL Negative for growth, final Fungal negative AFB negative    Latest Ref Rng & Units 03/18/2024    5:44 AM 03/17/2024    9:09 PM 03/12/2024    3:36 PM  CBC  WBC 4.0 - 10.5 K/uL 5.6  8.5  7.8   Hemoglobin 13.0 - 17.0 g/dL 52.8  41.3  24.4   Hematocrit 39.0 - 52.0 % 47.9  50.0  49.2   Platelets 150 - 400 K/uL  213  254  272.0        Latest Ref Rng & Units 03/24/2024   11:02 AM 03/24/2024    4:39 AM 03/20/2024    5:10 AM  BMP  Glucose 70 - 99 mg/dL 010  272  536   BUN 8 - 23 mg/dL 20  20  20    Creatinine 0.61 - 1.24 mg/dL 6.44  0.34  7.42   Sodium 135 - 145 mmol/L 129  128  135   Potassium 3.5 - 5.1 mmol/L 3.7  4.4  4.2   Chloride 98 - 111 mmol/L 92  93  100   CO2 22 - 32 mmol/L 29  28  28    Calcium  8.9 - 10.3 mg/dL 8.9  8.2  8.5     BNP No results found for: BNP  ProBNP No results found for: PROBNP  PFT No results found for: FEV1PRE, FEV1POST, FVCPRE, FVCPOST, TLC, DLCOUNC, PREFEV1FVCRT, PSTFEV1FVCRT  DG Chest 2 View Result Date: 03/21/2024 CLINICAL DATA:  Shortness of breath and what cough for 4 days. History of previous lung biopsy. EXAM: CHEST - 2 VIEW COMPARISON:  X-ray and CTA 03/17/2024. FINDINGS: Hyperinflation. No consolidation, pneumothorax or effusion. Chronic lung changes identified. Normal cardiopericardial silhouette with calcified aorta. Biopsy clip along the medial right upper lung. Degenerative changes along the spine. Please correlate with recent CTA. IMPRESSION: Hyperinflation with chronic changes. Electronically Signed   By: Adrianna Horde M.D.   On: 03/21/2024 16:11     Past medical hx Past Medical History:  Diagnosis Date   Emphysema lung (HCC)    GERD (gastroesophageal reflux disease)    Hypertension    Hypertriglyceridemia    Schizophrenia (HCC)      Social History   Tobacco Use   Smoking status: Former    Current packs/day: 0.00    Types: Cigarettes    Quit date: 03/17/2024    Years since quitting: 0.0   Smokeless tobacco: Never   Tobacco comments:    Wears patches   Vaping Use   Vaping status: Never Used  Substance Use Topics   Alcohol use: No    Alcohol/week: 0.0 standard drinks of alcohol   Drug use: No    Mr.Balin reports that he quit smoking about 4 weeks ago. His smoking use included cigarettes. He has never used  smokeless tobacco. He reports that he does not drink alcohol and does not use drugs.  Tobacco Cessation: Counseling given: Not Answered Tobacco comments: Wears patches  Former smoker as of 1 month ago I congratulated him and continue to support him in this journey  Past surgical hx, Family hx, Social hx all reviewed.  Current Outpatient Medications on File Prior to Visit  Medication Sig   albuterol  (VENTOLIN  HFA) 108 (90 Base) MCG/ACT inhaler TAKE 2 PUFFS BY MOUTH EVERY 6 HOURS AS NEEDED FOR WHEEZE OR SHORTNESS OF  BREATH   amLODipine  (NORVASC ) 10 MG tablet Take 1 tablet (10 mg total) by mouth daily.   aspirin  EC 81 MG tablet Take 81 mg by mouth daily. Swallow whole.   Fluticasone -Umeclidin-Vilant (TRELEGY ELLIPTA ) 100-62.5-25 MCG/ACT AEPB Inhale 1 puff into the lungs daily.   guaiFENesin  (ROBITUSSIN) 100 MG/5ML liquid Take 15 mLs by mouth every 8 (eight) hours.   haloperidol decanoate (HALDOL DECANOATE) 100 MG/ML injection Inject 100 mg into the muscle every 28 (twenty-eight) days.   ipratropium (ATROVENT ) 0.02 % nebulizer solution Take 2.5 mLs (0.5 mg total) by nebulization 4 (four) times daily.   levalbuterol  (XOPENEX ) 0.63 MG/3ML nebulizer solution Take 3 mLs (0.63 mg total) by nebulization 4 (four) times daily.   methylPREDNISolone  (MEDROL  DOSEPAK) 4 MG TBPK tablet Take as instructed on package   Multiple Vitamins-Minerals (PRESERVISION AREDS 2) CAPS TAKE 1 CAPSULE BY MOUTH TWICE A DAY   nicotine  (NICODERM CQ  - DOSED IN MG/24 HOURS) 14 mg/24hr patch Place 1 patch (14 mg total) onto the skin daily.   OXYGEN Inhale into the lungs.   rosuvastatin  (CRESTOR ) 10 MG tablet TAKE 1 TABLET BY MOUTH EVERYDAY AT BEDTIME   No current facility-administered medications on file prior to visit.     Allergies  Allergen Reactions   Penicillins Hives    Review Of Systems:  Constitutional:   No  weight loss, night sweats,  Fevers, chills, fatigue, or  lassitude.  HEENT:   No headaches,   Difficulty swallowing,  Tooth/dental problems, or  Sore throat,                No sneezing, itching, ear ache, nasal congestion, post nasal drip,   CV:  No chest pain,  Orthopnea, PND, swelling in lower extremities, anasarca, dizziness, palpitations, syncope.   GI  No heartburn, indigestion, abdominal pain, nausea, vomiting, diarrhea, change in bowel habits, loss of appetite, bloody stools.   Resp:  + shortness of breath with exertion or at rest.  + excess mucus, no productive cough,  No non-productive cough,  No coughing up of blood.  No change in color of mucus.  + occasional wheezing.  No chest wall deformity  Skin: no rash or lesions.  GU: no dysuria, change in color of urine, no urgency or frequency.  No flank pain, no hematuria   MS:  No joint pain or swelling.  + decreased range of motion.  No back pain.  Psych:  No change in mood or affect. No depression or anxiety.  No memory loss.   Vital Signs BP 111/77 (BP Location: Left Arm, Patient Position: Sitting, Cuff Size: Normal)   Pulse 75   Ht 5' 11 (1.803 m)   Wt 156 lb 3.2 oz (70.9 kg)   SpO2 93%   BMI 21.79 kg/m    Physical Exam:  General- No distress,  A&Ox3, pleasant ENT: No sinus tenderness, TM clear, pale nasal mucosa, no oral exudate,no post nasal drip, no LAN Cardiac: S1, S2, regular rate and rhythm, no murmur Chest: Occasional wheeze/no rales/ dullness; no accessory muscle use, no nasal flaring, no sternal retractions, diminished per bases bilaterally, distant breath sounds, prolonged expiratory phase Abd.: Soft Non-tender, nondistended, bowel sounds positive,Body mass index is 21.79 kg/m.  Ext: No clubbing cyanosis, edema, no obvious deformity Neuro: Physical deconditioning, moving all extremities x 4, alert and oriented x 3, appropriate Skin: No rashes, warm and dry, no obvious skin lesions Psych: normal mood and behavior   Assessment/Plan Stable pulmonary nodule on imaging Quit smoking x  1 month Recent  admission with COPD flare Plan Congratulations on quitting smoking. Keep up the great work. I have sent in a prescription for 7 mg nicotine  patches.  Your CT scan shows stable lung nodules. We will return you to the lung cancer screening program starting 09/2024. You will get a call to get this scheduled closer to the time. Continue oxygen at 3 L at rest and 4 L with exertion. Saturations should never fall below 88-90%. Check your oxygen levels with your saturation monitor. Continue Trelegy 1 puff once daily every day. Rinse mouth after use. Continue Xopenex  and Atrovent  nebs 4 times daily as you have been doing. Note your daily symptoms > remember red flags for COPD:  Increase in cough, increase in sputum production, increase in shortness of breath or activity intolerance. If you notice these symptoms, please call to be seen.    3 month follow up for COPD to ensure you are doing well with any provider. Please contact office for sooner follow up if symptoms do not improve or worsen or seek emergency care     I spent 25 minutes dedicated to the care of this patient on the date of this encounter to include pre-visit review of records, face-to-face time with the patient discussing conditions above, post visit ordering of testing, clinical documentation with the electronic health record, making appropriate referrals as documented, and communicating necessary information to the patient's healthcare team.     Raejean Bullock, NP 04/17/2024  10:55 AM

## 2024-04-17 NOTE — Telephone Encounter (Signed)
 FYI Only or Action Required?: Action required by provider: medication refill request.  Patient was last seen in primary care on 03/12/2024 by Francenia Ingle, NP. Called Nurse Triage reporting Medication Refill.    Triage Disposition: Call PCP Now  Patient/caregiver understands and will follow disposition?: Yes    In addition to the medication below, pt requesting refills from PCP rosuvastatin  (CRESTOR ) 10 MG tablet  amLODipine  (NORVASC ) 10 MG tablet  Reason for Disposition  [1] Prescription refill request for ESSENTIAL medicine (i.e., likelihood of harm to patient if not taken) AND [2] triager unable to refill per department policy  Answer Assessment - Initial Assessment Questions 1. DRUG NAME: What medicine do you need to have refilled?     amLODipine  (NORVASC ) 10 MG tablet rosuvastatin  (CRESTOR ) 10 MG tablet  2. REFILLS REMAINING: How many refills are remaining? (Note: The label on the medicine or pill bottle will show how many refills are remaining. If there are no refills remaining, then a renewal may be needed.)     0 3. EXPIRATION DATE: What is the expiration date? (Note: The label states when the prescription will expire, and thus can no longer be refilled.)     N/a 4. PRESCRIBING HCP: Who prescribed it? Reason: If prescribed by specialist, call should be referred to that group.     Macdonald Savoy, NP 5. SYMPTOMS: Do you have any symptoms?     N/a 6. PREGNANCY: Is there any chance that you are pregnant? When was your last menstrual period?     N/a  Protocols used: Medication Refill and Renewal Call-A-AH

## 2024-04-17 NOTE — Patient Instructions (Addendum)
 It is good to see you today. Congratulations on quitting smoking. Keep up the great work. I have sent in a prescription for 7 mg nicotine  patches.  Your CT scan shows stable lung nodules. We will return you to the lung cancer screening program starting 09/2024. You will get a call to get this scheduled closer to the time. Continue oxygen at 3 L at rest and 4 L with exertion. Saturations should never fall below 88-90%. Check your oxygen levels with your saturation monitor. Continue Trelegy 1 puff once daily every day. Rinse mouth after use. Continue Xopenex  and Atrovent  nebs 4 times daily as you have been doing. Note your daily symptoms > remember red flags for COPD:  Increase in cough, increase in sputum production, increase in shortness of breath or activity intolerance. If you notice these symptoms, please call to be seen.    3 month follow up for COPD to ensure you are doing well with any provider. Please contact office for sooner follow up if symptoms do not improve or worsen or seek emergency care

## 2024-04-17 NOTE — Telephone Encounter (Unsigned)
 Copied from CRM [id number]. Topic: Clinical - Medication Refill >> Apr 17, 2024  4:37 PM Ilean Mall F wrote: Medication: methylPREDNISolone  (MEDROL  DOSEPAK) 4 MG TBPK tablet, levalbuterol  (XOPENEX ) 0.63 MG/3ML nebulizer solution, ipratropium (ATROVENT ) 0.02 % nebulizer solution, guaiFENesin  (ROBITUSSIN) 100 MG/5ML liquid, and amLODipine  (NORVASC ) 10 MG tablet  Has the patient contacted their pharmacy? Yes; [facility] (Agent: If no, request that the patient contact the pharmacy for the refill. If patient does not wish to contact the pharmacy document the reason why and proceed with request.) (Agent: If yes, when and what did the pharmacy advise?)  This is the patient's preferred pharmacy:  [id number] [place], [place] - [address] [facility] [address] [place] [address] Phone: [phone] Fax: [phone]  Is this the correct pharmacy for this prescription? Yes If no, delete pharmacy and type the correct one.   Has the prescription been filled recently? Yes  Is the patient out of the medication? Yes  Has the patient been seen for an appointment in the last year OR does the patient have an upcoming appointment? Yes  Can we respond through MyChart? No  Agent: Please be advised that Rx refills may take up to 3 business days. We ask that you follow-up with your pharmacy.

## 2024-04-17 NOTE — Telephone Encounter (Deleted)
 Copied from CRM [id number]. Topic: Clinical - Medication Refill >> Apr 17, 2024  4:37 PM Ilean Mall F wrote: Medication: methylPREDNISolone  (MEDROL  DOSEPAK) 4 MG TBPK tablet, levalbuterol  (XOPENEX ) 0.63 MG/3ML nebulizer solution, ipratropium (ATROVENT ) 0.02 % nebulizer solution, guaiFENesin  (ROBITUSSIN) 100 MG/5ML liquid, and amLODipine  (NORVASC ) 10 MG tablet  Has the patient contacted their pharmacy? Yes; [facility] (Agent: If no, request that the patient contact the pharmacy for the refill. If patient does not wish to contact the pharmacy document the reason why and proceed with request.) (Agent: If yes, when and what did the pharmacy advise?)  This is the patient's preferred pharmacy:  [id number] [place], [place] - [address] [facility] [address] [place] [address] Phone: [phone] Fax: [phone]  Is this the correct pharmacy for this prescription? Yes If no, delete pharmacy and type the correct one.   Has the prescription been filled recently? Yes  Is the patient out of the medication? Yes  Has the patient been seen for an appointment in the last year OR does the patient have an upcoming appointment? Yes  Can we respond through MyChart? No  Agent: Please be advised that Rx refills may take up to 3 business days. We ask that you follow-up with your pharmacy. Answer Assessment - Initial Assessment Questions 1. REASON FOR CALL or QUESTION: What is your reason for calling today? or How can I best help you? or What question do you have that I can help answer?     Pt calling re: medications prescribed by hospital sent on 03/24/2024. Pt reports that [facility], but [facility]-- [facility].   [facility]ager called [facility] and spoke to rep-- rep stated that medications were delivered to bedside.  Protocols used: Information Only Call -  No Triage-A-AH

## 2024-04-20 MED ORDER — TRELEGY ELLIPTA 100-62.5-25 MCG/ACT IN AEPB
1.0000 | INHALATION_SPRAY | Freq: Every day | RESPIRATORY_TRACT | 11 refills | Status: AC
Start: 2024-04-20 — End: ?

## 2024-04-21 ENCOUNTER — Encounter: Payer: Self-pay | Admitting: Family Medicine

## 2024-04-21 ENCOUNTER — Ambulatory Visit (INDEPENDENT_AMBULATORY_CARE_PROVIDER_SITE_OTHER): Admitting: Family Medicine

## 2024-04-21 ENCOUNTER — Ambulatory Visit: Payer: Self-pay | Admitting: Family Medicine

## 2024-04-21 VITALS — BP 116/82 | HR 75 | Temp 97.9°F | Ht 71.0 in | Wt 156.0 lb

## 2024-04-21 DIAGNOSIS — I10 Essential (primary) hypertension: Secondary | ICD-10-CM

## 2024-04-21 DIAGNOSIS — Z09 Encounter for follow-up examination after completed treatment for conditions other than malignant neoplasm: Secondary | ICD-10-CM

## 2024-04-21 DIAGNOSIS — J439 Emphysema, unspecified: Secondary | ICD-10-CM | POA: Diagnosis not present

## 2024-04-21 LAB — COMPREHENSIVE METABOLIC PANEL WITH GFR
ALT: 10 U/L (ref 0–53)
AST: 12 U/L (ref 0–37)
Albumin: 3.9 g/dL (ref 3.5–5.2)
Alkaline Phosphatase: 88 U/L (ref 39–117)
BUN: 11 mg/dL (ref 6–23)
CO2: 32 meq/L (ref 19–32)
Calcium: 9.5 mg/dL (ref 8.4–10.5)
Chloride: 100 meq/L (ref 96–112)
Creatinine, Ser: 1.08 mg/dL (ref 0.40–1.50)
GFR: 70.49 mL/min (ref >60.00)
Glucose, Bld: 87 mg/dL (ref 70–99)
Potassium: 3.8 meq/L (ref 3.5–5.1)
Sodium: 137 meq/L (ref 135–145)
Total Bilirubin: 0.4 mg/dL (ref 0.2–1.2)
Total Protein: 6.3 g/dL (ref 6.0–8.3)

## 2024-04-21 MED ORDER — LEVALBUTEROL HCL 0.63 MG/3ML IN NEBU
0.6300 mg | INHALATION_SOLUTION | Freq: Four times a day (QID) | RESPIRATORY_TRACT | 12 refills | Status: AC
Start: 2024-04-21 — End: ?

## 2024-04-21 MED ORDER — AMLODIPINE BESYLATE 10 MG PO TABS: 10.0000 mg | ORAL_TABLET | Freq: Every day | ORAL | 0 refills | Status: DC

## 2024-04-21 MED ORDER — IPRATROPIUM BROMIDE 0.02 % IN SOLN
0.5000 mg | Freq: Four times a day (QID) | RESPIRATORY_TRACT | 12 refills | Status: AC
Start: 2024-04-21 — End: ?

## 2024-04-21 NOTE — Patient Instructions (Addendum)
-  It was good to see you today. -CONGRATULATIONS ON NOT SMOKING!!!!  -Continue all medications. Refilled Amlodipine , Xopenex  and Atrovent  neb solution. -Continue to use oxygen.  -Ordered labs. Office will call with results.  -Follow up in 3 months.

## 2024-04-21 NOTE — Progress Notes (Signed)
 Established Patient Office Visit   Subjective:  Patient ID: Travis Lawrence, male    DOB: 26-Apr-1956  Age: 68 y.o. MRN: [id number]  Chief Complaint  Patient presents with   Hospitalization Follow-up    HPI Patient is having a hospital follow up. He was admitted to [facility], 05/20 through 05/27, for COPD with acute exacerbation, acute respiratory failure with hypoxia, and paranoid schizophrenia. He presented with [facility] and productive cough. Recently had upper respiratory symptoms that were improving. In the ED, he was afebrile and oxygen saturation in the upper 80s on room air with normal HR and elevated BP. CTA chest was negative for PE or acute airspace disease, but notable for advanced emphysema.   He was referred to pulmonary and has already had a follow up. He has stopped smoking and using Nicoderm patches. He is to continue on oxygen, but currently is not wearing during visit. He reports his oxygen levels have been between 90-96%, various with wearing oxygen and not. Continue Xopenex  and Atrovent  nebulizer's; and Trelegy inhaler. He is suppose to follow up in 3 months.   Blood pressure was elevated. He was started on Amlodipine  10mg  daily. He reports he has been monitoring his BP at home with about the same readings as today. Denies CP, SHOB- no more than usual, HA, lightheadedness, dizziness, or lower extremity edema.   ROS See HPI above     Objective:   BP 116/82   Pulse 75   Temp 97.9 F (36.6 C) (Oral)   Ht 5' 11 (1.803 m)   Wt 156 lb (70.8 kg)   SpO2 93%   BMI 21.76 kg/m    Physical Exam Vitals reviewed.  Constitutional:      General: He is not in acute distress.    Appearance: Normal appearance. He is not ill-appearing, toxic-appearing or diaphoretic.  HENT:     Head: Normocephalic and atraumatic.   Eyes:     General:        Right eye: No discharge.        Left eye: No discharge.     Conjunctiva/sclera: Conjunctivae normal.    Cardiovascular:      Rate and Rhythm: Normal rate and regular rhythm.     Heart sounds: Normal heart sounds. No murmur heard.    No friction rub. No gallop.  Pulmonary:     Effort: Pulmonary effort is normal. No respiratory distress.     Breath sounds: Normal breath sounds.   Musculoskeletal:        General: Normal range of motion.   Skin:    General: Skin is warm and dry.   Neurological:     General: No focal deficit present.     Mental Status: He is alert and oriented to person, place, and time. Mental status is at baseline.   Psychiatric:        Mood and Affect: Mood normal.        Behavior: Behavior normal.        Thought Content: Thought content normal.        Judgment: Judgment normal.      Assessment & Plan:  Essential hypertension, benign -     amLODIPine  Besylate; Take 1 tablet (10 mg total) by mouth daily.  Dispense: 90 tablet; Refill: 0 -     Comprehensive metabolic panel with GFR  Pulmonary emphysema, unspecified emphysema type (HCC) -     Levalbuterol  HCl; Take 3 mLs (0.63 mg total) by nebulization 4 (four)  times daily.  Dispense: 75 mL; Refill: 12 -     Ipratropium Bromide ; Take 2.5 mLs (0.5 mg total) by nebulization 4 (four) times daily.  Dispense: 75 mL; Refill: 12  Hospital discharge follow-up  -Blood pressure is stable. Continue Amlodipine  10mg  daily and refilled medication. Ordered CMP to assess abnormalities found while in the hospital.  -Refilled Xopenex  and Atrovent  neb solution since he reports the previous prescription went to the wrong pharmacy.  -Congratulated on not smoking. Continue to use oxygen.   Return in about 3 months (around 07/22/2024) for chronic management.   Shahzain Kiester, NP

## 2024-04-28 DIAGNOSIS — J441 Chronic obstructive pulmonary disease with (acute) exacerbation: Secondary | ICD-10-CM | POA: Diagnosis not present

## 2024-04-28 DIAGNOSIS — J9601 Acute respiratory failure with hypoxia: Secondary | ICD-10-CM | POA: Diagnosis not present

## 2024-05-03 ENCOUNTER — Other Ambulatory Visit: Payer: Self-pay | Admitting: Family Medicine

## 2024-05-03 DIAGNOSIS — E781 Pure hyperglyceridemia: Secondary | ICD-10-CM

## 2024-06-02 ENCOUNTER — Other Ambulatory Visit (HOSPITAL_COMMUNITY): Payer: Self-pay

## 2024-06-23 ENCOUNTER — Encounter: Payer: Self-pay | Admitting: Acute Care

## 2024-06-23 ENCOUNTER — Ambulatory Visit (INDEPENDENT_AMBULATORY_CARE_PROVIDER_SITE_OTHER): Admitting: Acute Care

## 2024-06-23 VITALS — BP 122/76 | HR 85 | Temp 97.8°F | Ht 71.0 in | Wt 169.2 lb

## 2024-06-23 DIAGNOSIS — R918 Other nonspecific abnormal finding of lung field: Secondary | ICD-10-CM

## 2024-06-23 DIAGNOSIS — J449 Chronic obstructive pulmonary disease, unspecified: Secondary | ICD-10-CM

## 2024-06-23 DIAGNOSIS — F17211 Nicotine dependence, cigarettes, in remission: Secondary | ICD-10-CM | POA: Diagnosis not present

## 2024-06-23 DIAGNOSIS — Z87891 Personal history of nicotine dependence: Secondary | ICD-10-CM

## 2024-06-23 DIAGNOSIS — J9611 Chronic respiratory failure with hypoxia: Secondary | ICD-10-CM | POA: Diagnosis not present

## 2024-06-23 DIAGNOSIS — R9389 Abnormal findings on diagnostic imaging of other specified body structures: Secondary | ICD-10-CM

## 2024-06-23 DIAGNOSIS — J439 Emphysema, unspecified: Secondary | ICD-10-CM

## 2024-06-23 DIAGNOSIS — R911 Solitary pulmonary nodule: Secondary | ICD-10-CM

## 2024-06-23 NOTE — Progress Notes (Signed)
 History of Present Illness Travis Lawrence is a 68 y.o. male former smoker ( Quit 02/2024 with a 41 pack year smoking history) followed through the screening program, also has COPD. Here for evaluation of an abnormal screening scan .He will be followed by Lauraine Lites NP and DrRONITA Chris.   Synopsis Pt had a LDCT 10/20/2024 which was read as a 4 B. There was a  a new  irregular bilobed solid right upper lobe nodule measuring 17.7 mm. PET imaging was done, which showed uptake in the right upper lobe nodule. Patient was scheduled for bronchoscopy with biopsies on 12/17/2023 per Dr. Chris.The cytology was negative for malignancy. Plan was for a 3 month follow up to ensure the RUL nodule is stable and has not grown, and to ensure he is doing well regarding his COPD.   06/23/2024 Discussed the use of AI scribe software for clinical note transcription with the patient, who gave verbal consent to proceed.  History of Present Illness Pt presents for follow up for his COPD. I saw him last 04/17/2024 to do a 3 month follow up Ct chest after biopsies 12/17/2023. Biopsies  were negative for malignancy. At the time of that visit he has recently been in the hospital for a COPD flare( 03/17/2024-03/24/2024) , and he was still quite short of breath. He was using his Xopenex  nebs and albuterol  nebs four times a day.He was still dizzy and weak.   Today he states he has been doing much better in regarding to his COPD . He is compliant with his Trelegy and uses albuterol  and Xopenex  as needed for worsening shortness of breath or wheezing.  He uses his oxygen just while he is at home. He uses it at 4 L  with exertion . He has quit smoking and is using nicotine  patches. He has not smoked since before his 02/2024 admission. He states he is breathing better since he has quit.   He had been returned to Lung Cancer Screening , next scan is due 09/2024. He will follow up with us  in 3 months for a COPD check to ensure he is doing  well. We reviewed red flags for COPD Flare, and importance of seeking care early.       Test Results: 03/17/2024 CTA Chest Advanced emphysema. No acute airspace disease, pleural effusion, or pneumothorax. Right upper lobe nodule appears stable and measures about 18 x 5 mm on series 12, image 43, adjacent fiducial marker. Right middle lobe 5 mm pulmonary nodule on series 12 image 102 also similar. Subpleural lingular nodule measures 7 mm on series 12, image 12, also unchanged. No new suspicious lung nodule   IMPRESSION: 1. Negative for acute pulmonary embolus or aortic dissection. 2. Advanced emphysema. Stable bilateral pulmonary nodules. No acute airspace disease 3. Aortic atherosclerosis.     CT Chest 02/01/2024 IMPRESSION: 1. The dominant right upper lobe pulmonary nodule has decreased since 10/21/2023, favoring a benign inflammatory etiology. 2. Other pulmonary nodules of up to 7 mm are unchanged. The patient can return to lung cancer screening population on or after 10/20/2024. 3. Aortic atherosclerosis (ICD10-I70.0), coronary artery atherosclerosis and emphysema (ICD10-J43.9).       Cytology 12/17/2023 FINAL MICROSCOPIC DIAGNOSIS:  A. LUNG, RUL, FINE NEEDLE ASPIRATION  BIOPSY:  - Atypical cells present, favor reactive  - Chronic inflammation   B. LUNG, RUL, BRUSHING:  - Atypical cells present, favor reactive    BAL Negative for growth, final Fungal negative AFB negative  PET Scan  11/23/2023  The 2.8 by 0.8 cm right upper lobe nodule has a maximum standard uptake value of 4.3. This is suspicious for malignancy. 2. No findings of metastatic disease. 3. 7 by 6 mm subpleural nodule in the lingula has no substantial metabolic activity but is at the lower limits sensitive PET-CT characterization. 4. Centrilobular emphysema. 5. Coronary, aortic arch, and branch vessel atherosclerotic vascular disease.   Aortic Atherosclerosis (ICD10-I70.0) and Emphysema  (ICD10-J43.9).  LDCT Chest 10/21/2023 No pneumothorax. No pleural effusion. Severe centrilobular emphysema with diffuse bronchial wall thickening. No acute consolidative airspace disease or lung masses. No significant growth of previously visualized pulmonary nodules. Several new bilateral pulmonary nodules, the largest and most suspicious of which is an irregular bilobed solid right upper lobe nodule measuring 17.7 mm in volume derived mean diameter (series 3/image 72).   Upper abdomen: Cholecystectomy.   Musculoskeletal: No aggressive appearing focal osseous lesions. Moderate thoracic spondylosis.   IMPRESSION: 1. Lung-RADS 4B, suspicious.  Several new bilateral pulmonary nodules, the largest and most suspicious of which is an irregular bilobed solid right upper lobe nodule measuring 17.7 mm in volume derived mean diameter. Suggest PET-CT for further characterization at this time.  PET scan     Latest Ref Rng & Units 03/18/2024    5:44 AM 03/17/2024    9:09 PM 03/12/2024    3:36 PM  CBC  WBC 4.0 - 10.5 K/uL 5.6  8.5  7.8   Hemoglobin 13.0 - 17.0 g/dL 83.5  83.1  83.3   Hematocrit 39.0 - 52.0 % 47.9  50.0  49.2   Platelets 150 - 400 K/uL 213  254  272.0        Latest Ref Rng & Units 04/21/2024   12:13 PM 03/24/2024   11:02 AM 03/24/2024    4:39 AM  BMP  Glucose 70 - 99 mg/dL 87  848  853   BUN 6 - 23 mg/dL 11  20  20    Creatinine 0.40 - 1.50 mg/dL 8.91  8.97  9.08   Sodium 135 - 145 mEq/L 137  129  128   Potassium 3.5 - 5.1 mEq/L 3.8  3.7  4.4   Chloride 96 - 112 mEq/L 100  92  93   CO2 19 - 32 mEq/L 32  29  28   Calcium  8.4 - 10.5 mg/dL 9.5  8.9  8.2     BNP No results found for: BNP  ProBNP No results found for: PROBNP  PFT No results found for: FEV1PRE, FEV1POST, FVCPRE, FVCPOST, TLC, DLCOUNC, PREFEV1FVCRT, PSTFEV1FVCRT  No results found.   Past medical hx Past Medical History:  Diagnosis Date   Emphysema lung (HCC)    GERD  (gastroesophageal reflux disease)    Hypertension    Hypertriglyceridemia    Schizophrenia (HCC)      Social History   Tobacco Use   Smoking status: Former    Current packs/day: 0.00    Types: Cigarettes    Quit date: 03/17/2024    Years since quitting: 0.2   Smokeless tobacco: Never   Tobacco comments:    Wears patches     Quit smoking cigars in April MMP  Vaping Use   Vaping status: Never Used  Substance Use Topics   Alcohol use: No    Alcohol/week: 0.0 standard drinks of alcohol   Drug use: No    Mr.Lemon reports that he quit smoking about 3 months ago. His smoking use included cigarettes. He has never used smokeless tobacco. He  reports that he does not drink alcohol and does not use drugs.  Tobacco Cessation: Counseling given: Not Answered Tobacco comments: Wears patches  Quit smoking cigars in April MMP Former smoker , Quit smoking 02/2024  Past surgical hx, Family hx, Social hx all reviewed.  Current Outpatient Medications on File Prior to Visit  Medication Sig   albuterol  (VENTOLIN  HFA) 108 (90 Base) MCG/ACT inhaler TAKE 2 PUFFS BY MOUTH EVERY 6 HOURS AS NEEDED FOR WHEEZE OR SHORTNESS OF BREATH   amLODipine  (NORVASC ) 10 MG tablet Take 1 tablet (10 mg total) by mouth daily.   aspirin  EC 81 MG tablet Take 81 mg by mouth daily. Swallow whole.   Fluticasone -Umeclidin-Vilant (TRELEGY ELLIPTA ) 100-62.5-25 MCG/ACT AEPB Inhale 1 puff into the lungs daily.   guaiFENesin  (ROBITUSSIN) 100 MG/5ML liquid Take 15 mLs by mouth every 8 (eight) hours.   haloperidol decanoate (HALDOL DECANOATE) 100 MG/ML injection Inject 100 mg into the muscle every 28 (twenty-eight) days.   ipratropium (ATROVENT ) 0.02 % nebulizer solution Take 2.5 mLs (0.5 mg total) by nebulization 4 (four) times daily.   levalbuterol  (XOPENEX ) 0.63 MG/3ML nebulizer solution Take 3 mLs (0.63 mg total) by nebulization 4 (four) times daily.   Multiple Vitamins-Minerals (PRESERVISION AREDS 2) CAPS TAKE 1 CAPSULE BY  MOUTH TWICE A DAY   nicotine  (NICODERM CQ  - DOSED IN MG/24 HOURS) 14 mg/24hr patch Place 1 patch (14 mg total) onto the skin daily.   nicotine  (NICODERM CQ  - DOSED IN MG/24 HR) 7 mg/24hr patch Place 1 patch (7 mg total) onto the skin daily.   OXYGEN Inhale into the lungs.   rosuvastatin  (CRESTOR ) 10 MG tablet TAKE 1 TABLET BY MOUTH EVERYDAY AT BEDTIME   No current facility-administered medications on file prior to visit.     Allergies  Allergen Reactions   Penicillins Hives    Review Of Systems:  Constitutional:   No  weight loss, night sweats,  Fevers, chills, fatigue, or  lassitude.  HEENT:   No headaches,  Difficulty swallowing,  Tooth/dental problems, or  Sore throat,                No sneezing, itching, ear ache, nasal congestion, post nasal drip,   CV:  No chest pain,  Orthopnea, PND, swelling in lower extremities, anasarca, dizziness, palpitations, syncope.   GI  No heartburn, indigestion, abdominal pain, nausea, vomiting, diarrhea, change in bowel habits, loss of appetite, bloody stools.   Resp: + shortness of breath with exertion less at rest.  No excess mucus, no productive cough,  No non-productive cough,  No coughing up of blood.  No change in color of mucus.  No wheezing.  No chest wall deformity  Skin: no rash or lesions.  GU: no dysuria, change in color of urine, no urgency or frequency.  No flank pain, no hematuria   MS:  No joint pain or swelling.  + decreased range of motion.  No back pain.  Psych:  No change in mood or affect. No depression or anxiety.  No memory loss.   Vital Signs BP 122/76   Pulse 85   Temp 97.8 F (36.6 C) (Temporal)   Ht 5' 11 (1.803 m)   Wt 169 lb 3.2 oz (76.7 kg)   SpO2 93%   BMI 23.60 kg/m    Physical Exam:  General- No distress,  A&Ox3, pleasant ENT: No sinus tenderness, TM clear, pale nasal mucosa, no oral exudate,no post nasal drip, no LAN Cardiac: S1, S2, regular rate and rhythm,  no murmur Chest: No wheeze/ rales/  dullness; no accessory muscle use, no nasal flaring, no sternal retractions, diminished per bases Abd.: Soft Non-tender, ND, BS +, .Body mass index is 23.6 kg/m.  Ext: No clubbing cyanosis, edema, no obvious deformities Neuro:  normal strength, MAE x 4, A&O x 3 Skin: No rashes, warm and dry, no obvious skin lesions  Psych: normal mood and behavior    Assessment & Plan Pulmonary lung nodules Former, recently quit  smoker  COPD, chronic hypoxic respiratory failure Plan I am so glad you are doing well.  Congratulations on quitting smoking. I am so proud of you. We will return you to the lung cancer screening program starting 09/2024. You will get a call to get this scheduled closer to the time. Continue oxygen at 3 L at rest and 4 L with exertion. Saturations should never fall below 88-90%. Check your oxygen levels with your saturation monitor. Continue Trelegy 1 puff once daily every day. Rinse mouth after use. Continue Xopenex  and Atrovent  nebs 2 times daily . Note your daily symptoms > remember red flags for COPD:  Increase in cough, increase in sputum production, increase in shortness of breath or activity intolerance. If you notice these symptoms, please call to be seen.  3 month COPD follow up, but call to be seen sooner if you are flaring.    Please contact office for sooner follow up if symptoms do not improve or worsen or seek emergency care     I spent 30 minutes dedicated to the care of this patient on the date of this encounter to include pre-visit review of records, face-to-face time with the patient discussing conditions above, post visit ordering of testing, clinical documentation with the electronic health record, making appropriate referrals as documented, and communicating necessary information to the patient's healthcare team.      Lauraine JULIANNA Lites, NP 06/23/2024  2:29 PM

## 2024-06-23 NOTE — Patient Instructions (Addendum)
 It is good to see you today. I am so glad you are doing well.  Congratulations on quitting smoking. I am so proud of you. We will return you to the lung cancer screening program starting 09/2024. You will get a call to get this scheduled closer to the time. Continue oxygen at 3 L at rest and 4 L with exertion. Saturations should never fall below 88-90%. Check your oxygen levels with your saturation monitor. Continue Trelegy 1 puff once daily every day. Rinse mouth after use. Continue Xopenex  and Atrovent  nebs 2 times daily . Note your daily symptoms > remember red flags for COPD:  Increase in cough, increase in sputum production, increase in shortness of breath or activity intolerance. If you notice these symptoms, please call to be seen.  3 month COPD follow up, but call to be seen sooner if you are flaring.    Please contact office for sooner follow up if symptoms do not improve or worsen or seek emergency care

## 2024-06-25 DIAGNOSIS — J9601 Acute respiratory failure with hypoxia: Secondary | ICD-10-CM | POA: Diagnosis not present

## 2024-06-25 DIAGNOSIS — J441 Chronic obstructive pulmonary disease with (acute) exacerbation: Secondary | ICD-10-CM | POA: Diagnosis not present

## 2024-07-08 ENCOUNTER — Other Ambulatory Visit: Payer: Self-pay

## 2024-07-08 DIAGNOSIS — Z23 Encounter for immunization: Secondary | ICD-10-CM

## 2024-07-08 MED ORDER — COVID-19 MRNA VACC (MODERNA) 50 MCG/0.5ML IM SUSP: 0.5000 mL | Freq: Once | INTRAMUSCULAR | 0 refills | Status: AC

## 2024-07-17 ENCOUNTER — Other Ambulatory Visit: Payer: Self-pay | Admitting: Family Medicine

## 2024-07-17 DIAGNOSIS — I10 Essential (primary) hypertension: Secondary | ICD-10-CM

## 2024-07-21 ENCOUNTER — Encounter (INDEPENDENT_AMBULATORY_CARE_PROVIDER_SITE_OTHER): Payer: Medicare PPO | Admitting: Family Medicine

## 2024-07-21 ENCOUNTER — Ambulatory Visit: Payer: Self-pay

## 2024-07-21 NOTE — Telephone Encounter (Signed)
 FYI Only or Action Required?: FYI only for provider.  Patient was last seen in primary care on 04/21/2024 by Billy Philippe SAUNDERS, NP.  Called Nurse Triage reporting Sore Throat.  Symptoms began 2 days ago.  Interventions attempted: Rest, hydration, or home remedies.  Symptoms are: unchanged.  Triage Disposition: See PCP When Office is Open (Within 3 Days)  Patient/caregiver understands and will follow disposition?: Yes  Reason for Triage: Patient feeling under the weather for the past 2 days. Sore throat, runny nose and a fever. Patient did not have exact reading of fever to relay to agent.   Reason for Disposition  [1] Sore throat is the only symptom AND [2] present > 48 hours  Answer Assessment - Initial Assessment Questions Patient is scheduled to see PCP tomorrow at 11 AM for follow up visit  1. ONSET: When did the throat start hurting? (Hours or days ago)      Started 2 days ago 2. SEVERITY: How bad is the sore throat? (Scale 1-10; mild, moderate or severe)     3 out of 10 3. STREP EXPOSURE: Has there been any exposure to strep within the past week? If Yes, ask: What type of contact occurred?      no 4.  VIRAL SYMPTOMS: Are there any symptoms of a cold, such as a runny nose, cough, hoarse voice or red eyes?      Runny nose, cough,  5. FEVER: Do you have a fever? If Yes, ask: What is your temperature, how was it measured, and when did it start?     Patient states he hasn't taken his temperature but feels like he has a fever. 6. PUS ON THE TONSILS: Is there pus on the tonsils in the back of your throat?     no 7. OTHER SYMPTOMS: Do you have any other symptoms? (e.g., difficulty breathing, headache, rash)     Shortness of breath-hx of COPD  Protocols used: Sore Throat-A-AH

## 2024-07-21 NOTE — Progress Notes (Unsigned)
 error

## 2024-07-22 ENCOUNTER — Ambulatory Visit: Admitting: Family Medicine

## 2024-07-22 ENCOUNTER — Encounter: Payer: Self-pay | Admitting: Family Medicine

## 2024-07-22 VITALS — BP 108/74 | HR 78 | Temp 98.0°F | Ht 71.0 in | Wt 173.0 lb

## 2024-07-22 DIAGNOSIS — J069 Acute upper respiratory infection, unspecified: Secondary | ICD-10-CM

## 2024-07-22 DIAGNOSIS — E785 Hyperlipidemia, unspecified: Secondary | ICD-10-CM | POA: Diagnosis not present

## 2024-07-22 DIAGNOSIS — J432 Centrilobular emphysema: Secondary | ICD-10-CM

## 2024-07-22 DIAGNOSIS — I10 Essential (primary) hypertension: Secondary | ICD-10-CM

## 2024-07-22 DIAGNOSIS — J029 Acute pharyngitis, unspecified: Secondary | ICD-10-CM

## 2024-07-22 LAB — POC COVID19 BINAXNOW: SARS Coronavirus 2 Ag: NEGATIVE

## 2024-07-22 LAB — COMPREHENSIVE METABOLIC PANEL WITH GFR
ALT: 9 U/L (ref 0–53)
AST: 13 U/L (ref 0–37)
Albumin: 4.2 g/dL (ref 3.5–5.2)
Alkaline Phosphatase: 93 U/L (ref 39–117)
BUN: 9 mg/dL (ref 6–23)
CO2: 29 meq/L (ref 19–32)
Calcium: 9.5 mg/dL (ref 8.4–10.5)
Chloride: 97 meq/L (ref 96–112)
Creatinine, Ser: 1.14 mg/dL (ref 0.40–1.50)
GFR: 65.95 mL/min (ref >60.00)
Glucose, Bld: 82 mg/dL (ref 70–99)
Potassium: 3.5 meq/L (ref 3.5–5.1)
Sodium: 133 meq/L — ABNORMAL LOW (ref 135–145)
Total Bilirubin: 0.9 mg/dL (ref 0.2–1.2)
Total Protein: 6.7 g/dL (ref 6.0–8.3)

## 2024-07-22 LAB — CBC WITH DIFFERENTIAL/PLATELET
Basophils Absolute: 0 K/uL (ref 0.0–0.1)
Basophils Relative: 0.6 % (ref 0.0–3.0)
Eosinophils Absolute: 0.2 K/uL (ref 0.0–0.7)
Eosinophils Relative: 2.3 % (ref 0.0–5.0)
HCT: 42.7 % (ref 39.0–52.0)
Hemoglobin: 14.4 g/dL (ref 13.0–17.0)
Lymphocytes Relative: 24.1 % (ref 12.0–46.0)
Lymphs Abs: 1.9 K/uL (ref 0.7–4.0)
MCHC: 33.7 g/dL (ref 30.0–36.0)
MCV: 95.5 fl (ref 78.0–100.0)
Monocytes Absolute: 1 K/uL (ref 0.1–1.0)
Monocytes Relative: 13 % — ABNORMAL HIGH (ref 3.0–12.0)
Neutro Abs: 4.6 K/uL (ref 1.4–7.7)
Neutrophils Relative %: 60 % (ref 43.0–77.0)
Platelets: 267 K/uL (ref 150.0–400.0)
RBC: 4.48 Mil/uL (ref 4.22–5.81)
RDW: 13.9 % (ref 11.5–15.5)
WBC: 7.7 K/uL (ref 4.0–10.5)

## 2024-07-22 LAB — LIPID PANEL
Cholesterol: 169 mg/dL (ref 0–200)
HDL: 53.2 mg/dL (ref >39.00)
LDL Cholesterol: 91 mg/dL (ref 0–99)
NonHDL: 115.32
Total CHOL/HDL Ratio: 3
Triglycerides: 121 mg/dL (ref 0.0–149.0)
VLDL: 24.2 mg/dL (ref 0.0–40.0)

## 2024-07-22 LAB — POCT RAPID STREP A (OFFICE): Rapid Strep A Screen: NEGATIVE

## 2024-07-22 LAB — POCT INFLUENZA A/B
Influenza A, POC: NEGATIVE
Influenza B, POC: NEGATIVE

## 2024-07-22 MED ORDER — AZITHROMYCIN 500 MG PO TABS: 500.0000 mg | ORAL_TABLET | Freq: Every day | ORAL | 0 refills | Status: DC

## 2024-07-22 MED ORDER — ALBUTEROL SULFATE HFA 108 (90 BASE) MCG/ACT IN AERS: INHALATION_SPRAY | RESPIRATORY_TRACT | 5 refills | Status: AC

## 2024-07-22 NOTE — Patient Instructions (Addendum)
-  It was good to see you today! Congratulations on not smoking!   -Rapid covid, influenza, and strep throat is negative.  -Refilled Albuterol  Inhaler.  -Prescribed Azithromycin  500mg  tablet, take 1 tablet once a day for 5 days. If not improved, follow up.  -Continue all medications.  -Ordered labs. Office will call with lab results. -Follow up in 3 months for chronic management.   This is from the pulmonary provider:  We will return you to the lung cancer screening program starting 09/2024. You will get a call to get this scheduled closer to the time. Continue oxygen  at 3 L at rest and 4 L with exertion. Saturations should never fall below 88-90%. Check your oxygen  levels with your saturation monitor. Continue Trelegy 1 puff once daily every day. Rinse mouth after use. Continue Xopenex  and Atrovent  nebs 2 times daily . Note your daily symptoms > remember red flags for COPD:  Increase in cough, increase in sputum production, increase in shortness of breath or activity intolerance. If you notice these symptoms, please call to be seen.

## 2024-07-22 NOTE — Progress Notes (Signed)
 Established Patient Office Visit   Subjective:  Patient ID: Travis Lawrence, male    DOB: 1956/05/07  Age: 68 y.o. MRN: [id number]  Chief Complaint  Patient presents with   Medical Management of Chronic Issues    3 month follow up   Sore Throat   Nasal Congestion    HPI Patient reports he had an influenza vaccine last week. However, three days ago he started having a sore throat, productive cough (clear, thick), SHOB on exertion, sinus pressure, and nasal drainage/congestion. Denies chest pain, fever, headache, ear ache or drainage, nausea, vomiting, loss of appetite.   HTN: Chronic. Patient is prescribed Amlodipine  10mg  daily. He reports he monitors his blood pressure at home. He reports  it is good.  BP Readings from Last 3 Encounters:  07/22/24 108/74  06/23/24 122/76  04/21/24 116/82   Hyperlipidemia: Chronic. Patient is taking Rosuvastatin  10mg  daily.  Lab Results  Component Value Date   CHOL 139 03/12/2024   HDL 57.20 03/12/2024   LDLCALC 65 03/12/2024   TRIG 83.0 03/12/2024   CHOLHDL 2 03/12/2024    Patient is under the care of [facility] for lung nodule from abnormal CT of chest and pulmonary emphysema. He is a former smoker and uses oxygen  as needed. Patient is prescribed Albuterol  Inhaler,  Atrovent /Xopenex  nebulizer, and Trelegy inhaler daily.  He reports he is using the Albuterol  Inhaler about twice a day SHOB, even beyond him being currently sick; uses Atrovent  and Xopenex  nebulizers about once or twice a day. He is requesting a refill on Albuterol  Inhaler.    ROS See HPI above     Objective:   BP 108/74   Pulse 78   Temp 98 F (36.7 C) (Oral)   Ht 5' 11 (1.803 m)   Wt 173 lb (78.5 kg)   SpO2 91%   BMI 24.13 kg/m    Physical Exam Vitals reviewed.  Constitutional:      General: He is not in acute distress.    Appearance: Normal appearance. He is not ill-appearing (Mild), toxic-appearing or diaphoretic.  HENT:     Head: Normocephalic and  atraumatic.     Right Ear: Tympanic membrane, ear canal and external ear normal. There is no impacted cerumen.     Left Ear: Tympanic membrane, ear canal and external ear normal. There is no impacted cerumen.     Nose: Rhinorrhea present.     Right Sinus: Maxillary sinus tenderness and frontal sinus tenderness present.     Left Sinus: Maxillary sinus tenderness and frontal sinus tenderness present.     Mouth/Throat:     Pharynx: Oropharynx is clear. Uvula midline. No pharyngeal swelling, oropharyngeal exudate, posterior oropharyngeal erythema or uvula swelling.  Eyes:     General:        Right eye: No discharge.        Left eye: No discharge.     Conjunctiva/sclera: Conjunctivae normal.  Cardiovascular:     Rate and Rhythm: Normal rate and regular rhythm.     Heart sounds: Normal heart sounds. No murmur heard.    No friction rub. No gallop.  Pulmonary:     Effort: Pulmonary effort is normal. No respiratory distress.     Breath sounds: Wheezing present.     Comments: Coughing during visit  Musculoskeletal:        General: Normal range of motion.  Skin:    General: Skin is warm and dry.  Neurological:     General: No focal  deficit present.     Mental Status: He is alert and oriented to person, place, and time. Mental status is at baseline.  Psychiatric:        Mood and Affect: Mood normal.        Behavior: Behavior normal.        Thought Content: Thought content normal.        Judgment: Judgment normal.     Results for orders placed or performed in visit on 07/22/24  POC Influenza A/B  Result Value Ref Range   Influenza A, POC Negative Negative   Influenza B, POC Negative Negative  POC Rapid Strep A  Result Value Ref Range   Rapid Strep A Screen Negative Negative  POC COVID-19  Result Value Ref Range   SARS Coronavirus 2 Ag Negative Negative    The 10-year ASCVD risk score (Arnett DK, et al., 2019) is: 10.5%    Assessment & Plan:  Sore throat -     POCT Influenza  A/B -     POCT rapid strep A -     POC COVID-19 BinaxNow  Centrilobular emphysema (HCC) -     Albuterol  Sulfate HFA; TAKE 2 PUFFS BY MOUTH EVERY 6 HOURS AS NEEDED FOR WHEEZE OR SHORTNESS OF BREATH  Dispense: 8.5 each; Refill: 5  Upper respiratory tract infection, unspecified type -     Azithromycin ; Take 1 tablet (500 mg total) by mouth daily for 5 days.  Dispense: 5 tablet; Refill: 0 -     CBC with Differential/Platelet  Essential hypertension, benign -     Comprehensive metabolic panel with GFR  Hyperlipidemia, unspecified hyperlipidemia type -     Lipid panel -     Comprehensive metabolic panel with GFR  -Rapid covid, influenza, and strep throat is negative.  -Refilled Albuterol  Inhaler.  -Prescribed Azithromycin  500mg  tablet, take 1 tablet once a day for 5 days. If not improved, follow up. Concerned his emphysema and lung nodule, he could easily be developing pneumonia.  -Continue all medications. Blood pressure is stable, continue Amlodipine .  -Ordered labs (CBC, CMP, and Lipid-not fasting) based on symptoms and medical history of HTN and hyperlipidemia.  Office will call with lab results. -Follow up in 3 months for chronic management.   Provided information from last pulmonary provider instructions to help remind patient. Discussed about trying to using Xopenex  and Atrovent  nebs BID so hopefully he will not need Albuterol  Inhaler as much.  This is from the pulmonary provider:  We will return you to the lung cancer screening program starting 09/2024. You will get a call to get this scheduled closer to the time. Continue oxygen  at 3 L at rest and 4 L with exertion. Saturations should never fall below 88-90%. Check your oxygen  levels with your saturation monitor. Continue Trelegy 1 puff once daily every day. Rinse mouth after use. Continue Xopenex  and Atrovent  nebs 2 times daily . Note your daily symptoms > remember red flags for COPD:  Increase in cough, increase in sputum  production, increase in shortness of breath or activity intolerance. If you notice these symptoms, please call to be seen.   Return in about 3 months (around 10/21/2024) for chronic management.   Farren Nelles, NP

## 2024-07-23 ENCOUNTER — Ambulatory Visit: Payer: Self-pay | Admitting: Family Medicine

## 2024-07-23 DIAGNOSIS — E871 Hypo-osmolality and hyponatremia: Secondary | ICD-10-CM

## 2024-07-26 DIAGNOSIS — J441 Chronic obstructive pulmonary disease with (acute) exacerbation: Secondary | ICD-10-CM | POA: Diagnosis not present

## 2024-07-26 DIAGNOSIS — J9601 Acute respiratory failure with hypoxia: Secondary | ICD-10-CM | POA: Diagnosis not present

## 2024-07-31 ENCOUNTER — Telehealth: Payer: Self-pay | Admitting: *Deleted

## 2024-07-31 NOTE — Telephone Encounter (Signed)
 Noted, closing encounter.

## 2024-08-04 ENCOUNTER — Other Ambulatory Visit: Payer: Self-pay | Admitting: Family Medicine

## 2024-08-04 DIAGNOSIS — E781 Pure hyperglyceridemia: Secondary | ICD-10-CM

## 2024-08-05 ENCOUNTER — Other Ambulatory Visit

## 2024-08-06 ENCOUNTER — Other Ambulatory Visit

## 2024-08-06 ENCOUNTER — Ambulatory Visit: Payer: Self-pay | Admitting: Family Medicine

## 2024-08-06 DIAGNOSIS — E871 Hypo-osmolality and hyponatremia: Secondary | ICD-10-CM | POA: Diagnosis not present

## 2024-08-06 LAB — COMPREHENSIVE METABOLIC PANEL WITH GFR
ALT: 9 U/L (ref 0–53)
AST: 9 U/L (ref 0–37)
Albumin: 3.9 g/dL (ref 3.5–5.2)
Alkaline Phosphatase: 94 U/L (ref 39–117)
BUN: 8 mg/dL (ref 6–23)
CO2: 29 meq/L (ref 19–32)
Calcium: 9.2 mg/dL (ref 8.4–10.5)
Chloride: 99 meq/L (ref 96–112)
Creatinine, Ser: 1.08 mg/dL (ref 0.40–1.50)
GFR: 70.35 mL/min (ref >60.00)
Glucose, Bld: 188 mg/dL — ABNORMAL HIGH (ref 70–99)
Potassium: 3.5 meq/L (ref 3.5–5.1)
Sodium: 138 meq/L (ref 135–145)
Total Bilirubin: 0.5 mg/dL (ref 0.2–1.2)
Total Protein: 6.4 g/dL (ref 6.0–8.3)

## 2024-08-19 ENCOUNTER — Telehealth: Payer: Self-pay

## 2024-08-19 NOTE — Telephone Encounter (Signed)
 Copied from CRM [id number]. Topic: Clinical - Order For Equipment >> Aug 19, 2024 11:46 AM Corean SAUNDERS wrote: Reason for CRM: Patient states [employer] will be faxing a request to Sarah Groce for a portable oxygen  concentrator.

## 2024-08-21 NOTE — Telephone Encounter (Signed)
 Copied from CRM [id number]. Topic: Clinical - Order For Equipment >> Aug 20, 2024  5:00 PM Joesph PARAS wrote: Reason for CRM: [employer] [facility]  is calling to notify provider that patient would like to switch oxygen  through them. [employer] will need qualifying oxygen  documentation to be sent, detailed in a letter than will be faxed over. Last face to face visit, relevant testing, and will need a written and signed order. Medicare requires a three-step test for the oxygen  and they will need that as well.  C/B at [phone]

## 2024-08-24 NOTE — Telephone Encounter (Signed)
 Called [employer].  Spoke with Harlene.  Patient needs a face to face visit and a 3 step oxygen  walk test within the last 12 months  in order for Medicare to pay for the [employer] oxygen .  The 3 step oxygen  test is SaO2 on room air at rest, SaO2 on room air while walking and SaO2 on oxygen  while walking.  Left message on patients VM to call back to schedule an office visit.  I do not see where patient has had a walk test in the last 12 months.  Will correct if he has.

## 2024-08-25 DIAGNOSIS — J441 Chronic obstructive pulmonary disease with (acute) exacerbation: Secondary | ICD-10-CM | POA: Diagnosis not present

## 2024-08-25 DIAGNOSIS — J9601 Acute respiratory failure with hypoxia: Secondary | ICD-10-CM | POA: Diagnosis not present

## 2024-08-27 NOTE — Telephone Encounter (Signed)
 Copied from CRM [id number]. Topic: Appointments - Scheduling Inquiry for Clinic >> Aug 27, 2024 12:22 PM Rozanna MATSU wrote: Reason for CRM: Pt calling stated he spoke with [employer] and they advised him that he will need a 6 minute walk test.

## 2024-08-27 NOTE — Telephone Encounter (Signed)
 Routing to peter kiewit sons as I'm not sure how to schedule the 6 min walks.

## 2024-08-31 NOTE — Telephone Encounter (Signed)
 Patient called today to schedule a PFT but there is no other in his chart for one.  Will need the referral in order to schedule.  Thanks.

## 2024-08-31 NOTE — Telephone Encounter (Signed)
 Patient does not need a 6 minute walk to qualify for oxygen  for [employer], he just needs a qualifying walk in the office.  This can be done at his visit on 09/16/24.  I called and let the patient know that he just needs a qualifying walk that can be done at his OV with Lauraine on 11/19 to get oxygen  through [employer] since he is changing oxygen  companies.  He verbalized understanding.  Nothing further needed.

## 2024-09-01 ENCOUNTER — Telehealth: Payer: Self-pay

## 2024-09-01 NOTE — Telephone Encounter (Signed)
 Copied from CRM [id number]. Topic: Clinical - Order For Equipment >> Aug 20, 2024  5:00 PM Joesph PARAS wrote: Reason for CRM: [employer] oxygen  is calling to notify provider that patient would like to switch oxygen  through them. [employer] will need qualifying oxygen  documentation to be sent, detailed in a letter than will be faxed over. Last face to face visit, relevant testing, and will need a written and signed order. Medicare requires a three-step test for the oxygen  and they will need that as well.  C/B at [phone] >> Sep 01, 2024  2:14 PM Russell PARAS wrote: Harlene, with [employer], is contacting clinic to verify appropriate steps are being taken for switch over to oxygen  for pt. Verified he has a FU on 11/19 with provider and will have walk test performed at that time.  Harlene wanted to mention the proper steps to take during the walk test that they require for Medicare to approve oxygen   Room air at rest Room air w/exertion or exercise with oxygen  levels going below 88%  Exertion w/oxygen  back on, with readings above 88%  Please remember to include Liter flow

## 2024-09-03 NOTE — Telephone Encounter (Signed)
 Will need to wait for walk test to be performed to fax back paperwork,is on my desk waiting to have all required documentation

## 2024-09-14 NOTE — Telephone Encounter (Signed)
 Copied from CRM #[id number]. Topic: General - Other >> Sep 14, 2024 12:46 PM Ismael A wrote: Reason for CRM: received call from Harlene at [facility] stating she faxed over an oxygen  test letter for patient's upcoming appointment on 11/19  Noted & I have updated appt note stating pt needs O2 requalification.

## 2024-09-16 ENCOUNTER — Other Ambulatory Visit: Payer: Self-pay

## 2024-09-16 ENCOUNTER — Ambulatory Visit: Admitting: Acute Care

## 2024-09-16 ENCOUNTER — Encounter: Payer: Self-pay | Admitting: Acute Care

## 2024-09-16 ENCOUNTER — Other Ambulatory Visit: Payer: Self-pay | Admitting: Acute Care

## 2024-09-16 ENCOUNTER — Telehealth: Payer: Self-pay | Admitting: Acute Care

## 2024-09-16 VITALS — BP 114/73 | HR 81 | Temp 97.6°F | Ht 71.0 in | Wt 173.4 lb

## 2024-09-16 DIAGNOSIS — J449 Chronic obstructive pulmonary disease, unspecified: Secondary | ICD-10-CM | POA: Diagnosis not present

## 2024-09-16 DIAGNOSIS — Z87891 Personal history of nicotine dependence: Secondary | ICD-10-CM

## 2024-09-16 DIAGNOSIS — Z9981 Dependence on supplemental oxygen: Secondary | ICD-10-CM

## 2024-09-16 DIAGNOSIS — J9611 Chronic respiratory failure with hypoxia: Secondary | ICD-10-CM

## 2024-09-16 DIAGNOSIS — Z122 Encounter for screening for malignant neoplasm of respiratory organs: Secondary | ICD-10-CM

## 2024-09-16 DIAGNOSIS — F17210 Nicotine dependence, cigarettes, uncomplicated: Secondary | ICD-10-CM

## 2024-09-16 DIAGNOSIS — R9389 Abnormal findings on diagnostic imaging of other specified body structures: Secondary | ICD-10-CM | POA: Diagnosis not present

## 2024-09-16 NOTE — Telephone Encounter (Signed)
 I do not see an open order for this Ct can someone place it

## 2024-09-16 NOTE — Patient Instructions (Signed)
 It is good to see you today. I am glad you are doing well from a respiratory standpoint. We walked you today on an [employer] oxygen  tank and you did well. Will place an order for [employer] to be worn at 4 L nasal cannula. You should get a phone call about getting this delivered. Please continue using your Trelegy 1 puff in the morning daily.   Rinse mouth after use Continue using your albuterol  as you have been doing for breakthrough shortness of breath or wheezing. You are due for your follow-up low-dose screening CT December 2025. I have reached out to the lung cancer screening navigation staff and they will call you to get you scheduled. Please call for any blood in your sputum or unexplained weight loss. Note your daily symptoms > remember red flags for COPD:  Increase in cough, increase in sputum production, increase in shortness of breath or activity intolerance. If you notice these symptoms, please call to be seen.  Please contact office for sooner follow up if symptoms do not improve or worsen or seek emergency care

## 2024-09-16 NOTE — Progress Notes (Signed)
 History of Present Illness Travis Lawrence is a 68 y.o. male  former smoker ( Quit 02/2024 with a 41 pack year smoking history) followed through the screening program, also has COPD . He is here today for a [employer] walk, as he is unable to carry the heavy oxygen  tanks.  Synopsis Pt had a LDCT 10/20/2024 which was read as a 4 B. There was a  a new  irregular bilobed solid right upper lobe nodule measuring 17.7 mm. PET imaging was done, which showed uptake in the right upper lobe nodule. Patient was scheduled for bronchoscopy with biopsies on 12/17/2023 per Dr. Shelah.The cytology was negative for malignancy. Plan is for continued surveillance of the lung nodule and continued management of his COPD.      09/16/2024 Discussed the use of AI scribe software for clinical note transcription with the patient, who gave verbal consent to proceed.  History of Present Illness Patient presents for ambulatory saturation walk with [employer].  He was walked on [employer] and he was able to maintain oxygen  saturations of 92% on 4 L nasal cannula.  He is unable to carry the larger tanks and as such he is not wearing his oxygen .  Patient states he is compliant with his Trelegy 1 puff once daily.  He is using his albuterol  approximately 2 times a day.  He denies any fever, and states secretions are clear.  Patient is due for repeat low-dose CT lung cancer screening December 2025.  Patient has a right upper lobe nodule that is under surveillance.  He did undergo bronchoscopy with biopsies 12/17/2023 and cytology was negative for malignancy in the right upper lobe nodule.  I have requested patient be scheduled for his December 2025 follow-up CT scan.  He will follow-up with me after the scan to review the results.  Ambulatory Walk [employer] Qualification Wed Sep 16, 2024 11:26 AM   SATURATION QUALIFICATIONS: (This note is used to comply with regulatory documentation for home oxygen )   Patient Saturations on Room Air at Rest  = 92%   Patient Saturations on Room Air while Ambulating = 86%   Patient Saturations on 4 Liters of oxygen  while Ambulating = 92%   Please briefly explain why patient needs home oxygen :Needs 4L POC to maintain O2 sats    I have reviewed the results of the patient's ambulatory oximetry walk on the [employer]. He qualifies, and can maintain his sats at 4 L [place]. Order has been placed for [employer].      Test Results: 03/17/2024 CTA Chest Advanced emphysema. No acute airspace disease, pleural effusion, or pneumothorax. Right upper lobe nodule appears stable and measures about 18 x 5 mm on series 12, image 43, adjacent fiducial marker. Right middle lobe 5 mm pulmonary nodule on series 12 image 102 also similar. Subpleural lingular nodule measures 7 mm on series 12, image 12, also unchanged. No new suspicious lung nodule   IMPRESSION: 1. Negative for acute pulmonary embolus or aortic dissection. 2. Advanced emphysema. Stable bilateral pulmonary nodules. No acute airspace disease 3. Aortic atherosclerosis.     CT Chest 02/01/2024 IMPRESSION: 1. The dominant right upper lobe pulmonary nodule has decreased since 10/21/2023, favoring a benign inflammatory etiology. 2. Other pulmonary nodules of up to 7 mm are unchanged. The patient can return to lung cancer screening population on or after 10/20/2024. 3. Aortic atherosclerosis (ICD10-I70.0), coronary artery atherosclerosis and emphysema (ICD10-J43.9).       Cytology 12/17/2023 FINAL MICROSCOPIC DIAGNOSIS:  A. LUNG, RUL, FINE  NEEDLE ASPIRATION  BIOPSY:  - Atypical cells present, favor reactive  - Chronic inflammation   B. LUNG, RUL, BRUSHING:  - Atypical cells present, favor reactive    BAL Negative for growth, final Fungal negative AFB negative   PET Scan 11/23/2023  The 2.8 by 0.8 cm right upper lobe nodule has a maximum standard uptake value of 4.3. This is suspicious for malignancy. 2. No findings of metastatic disease. 3. 7  by 6 mm subpleural nodule in the lingula has no substantial metabolic activity but is at the lower limits sensitive PET-CT characterization. 4. Centrilobular emphysema. 5. Coronary, aortic arch, and branch vessel atherosclerotic vascular disease.   Aortic Atherosclerosis (ICD10-I70.0) and Emphysema (ICD10-J43.9).   LDCT Chest 10/21/2023 No pneumothorax. No pleural effusion. Severe centrilobular emphysema with diffuse bronchial wall thickening. No acute consolidative airspace disease or lung masses. No significant growth of previously visualized pulmonary nodules. Several new bilateral pulmonary nodules, the largest and most suspicious of which is an irregular bilobed solid right upper lobe nodule measuring 17.7 mm in volume derived mean diameter (series 3/image 72).   Upper abdomen: Cholecystectomy.   Musculoskeletal: No aggressive appearing focal osseous lesions. Moderate thoracic spondylosis.   IMPRESSION: 1. Lung-RADS 4B, suspicious.  Several new bilateral pulmonary nodules, the largest and most suspicious of which is an irregular bilobed solid right upper lobe nodule measuring 17.7 mm in volume derived mean diameter. Suggest PET-CT for further characterization at this time.       Latest Ref Rng & Units 07/22/2024   11:57 AM 03/18/2024    5:44 AM 03/17/2024    9:09 PM  CBC  WBC 4.0 - 10.5 K/uL 7.7  5.6  8.5   Hemoglobin 13.0 - 17.0 g/dL 85.5  83.5  83.1   Hematocrit 39.0 - 52.0 % 42.7  47.9  50.0   Platelets 150.0 - 400.0 K/uL 267.0  213  254        Latest Ref Rng & Units 08/06/2024   12:50 PM 07/22/2024   11:57 AM 04/21/2024   12:13 PM  BMP  Glucose 70 - 99 mg/dL 811  82  87   BUN 6 - 23 mg/dL 8  9  11    Creatinine 0.40 - 1.50 mg/dL 8.91  8.85  8.91   Sodium 135 - 145 mEq/L 138  133  137   Potassium 3.5 - 5.1 mEq/L 3.5  3.5  3.8   Chloride 96 - 112 mEq/L 99  97  100   CO2 19 - 32 mEq/L 29  29  32   Calcium  8.4 - 10.5 mg/dL 9.2  9.5  9.5     BNP No results  found for: BNP  ProBNP No results found for: PROBNP  PFT No results found for: FEV1PRE, FEV1POST, FVCPRE, FVCPOST, TLC, DLCOUNC, PREFEV1FVCRT, PSTFEV1FVCRT  No results found.   Past medical hx Past Medical History:  Diagnosis Date   Emphysema lung (HCC)    GERD (gastroesophageal reflux disease)    Hypertension    Hypertriglyceridemia    Schizophrenia (HCC)      Social History   Tobacco Use   Smoking status: Former    Current packs/day: 0.00    Types: Cigarettes    Quit date: 03/17/2024    Years since quitting: 0.5   Smokeless tobacco: Never   Tobacco comments:    Wears patches     Quit smoking cigars in April MMP  Vaping Use   Vaping status: Never Used  Substance Use Topics  Alcohol use: No    Alcohol/week: 0.0 standard drinks of alcohol   Drug use: No    Mr.Ciullo reports that he quit smoking about 6 months ago. His smoking use included cigarettes. He has never used smokeless tobacco. He reports that he does not drink alcohol and does not use drugs.  Tobacco Cessation: Counseling given: Not Answered Tobacco comments: Wears patches  Quit smoking cigars in April MMP Former smoker 02/2024 with a 41 pack year smoking history  Past surgical hx, Family hx, Social hx all reviewed.  Current Outpatient Medications on File Prior to Visit  Medication Sig   albuterol  (VENTOLIN  HFA) 108 (90 Base) MCG/ACT inhaler TAKE 2 PUFFS BY MOUTH EVERY 6 HOURS AS NEEDED FOR WHEEZE OR SHORTNESS OF BREATH   amLODipine  (NORVASC ) 10 MG tablet TAKE 1 TABLET BY MOUTH EVERY DAY   aspirin  EC 81 MG tablet Take 81 mg by mouth daily. Swallow whole.   Fluticasone -Umeclidin-Vilant (TRELEGY ELLIPTA ) 100-62.5-25 MCG/ACT AEPB Inhale 1 puff into the lungs daily.   guaiFENesin  (ROBITUSSIN) 100 MG/5ML liquid Take 15 mLs by mouth every 8 (eight) hours.   haloperidol decanoate (HALDOL DECANOATE) 100 MG/ML injection Inject 100 mg into the muscle every 28 (twenty-eight) days.    ipratropium (ATROVENT ) 0.02 % nebulizer solution Take 2.5 mLs (0.5 mg total) by nebulization 4 (four) times daily.   levalbuterol  (XOPENEX ) 0.63 MG/3ML nebulizer solution Take 3 mLs (0.63 mg total) by nebulization 4 (four) times daily.   Multiple Vitamins-Minerals (PRESERVISION AREDS 2) CAPS TAKE 1 CAPSULE BY MOUTH TWICE A DAY   OXYGEN  Inhale into the lungs.   rosuvastatin  (CRESTOR ) 10 MG tablet TAKE 1 TABLET BY MOUTH EVERYDAY AT BEDTIME   No current facility-administered medications on file prior to visit.     Allergies  Allergen Reactions   Penicillins Hives    Review Of Systems:  Constitutional:   No  weight loss, night sweats,  Fevers, chills, fatigue, or  lassitude.  HEENT:   No headaches,  Difficulty swallowing,  Tooth/dental problems, or  Sore throat,                No sneezing, itching, ear ache, nasal congestion, post nasal drip,   CV:  No chest pain,  Orthopnea, PND, swelling in lower extremities, anasarca, dizziness, palpitations, syncope.   GI  No heartburn, indigestion, abdominal pain, nausea, vomiting, diarrhea, change in bowel habits, loss of appetite, bloody stools.   Resp: + shortness of breath with exertion lessor at rest.  + baseline  excess mucus, + Baseline  productive cough,  No non-productive cough,  No coughing up of blood.  No change in color of mucus.  + occasional  wheezing.  No chest wall deformity  Skin: no rash or lesions.  GU: no dysuria, change in color of urine, no urgency or frequency.  No flank pain, no hematuria   MS:  No joint pain or swelling.  No decreased range of motion.  No back pain.  Psych:  No change in mood or affect. No depression or anxiety.  No memory loss.   Vital Signs BP 114/73   Pulse 81   Temp 97.6 F (36.4 C) (Oral)   Ht 5' 11 (1.803 m)   Wt 173 lb 6.4 oz (78.7 kg)   SpO2 92% Comment: On room air  BMI 24.18 kg/m    Physical Exam:  General- No distress,  A&Ox3, pleasant, not wearing his oxygen  ENT: No sinus  tenderness, TM clear, pale nasal mucosa, no oral exudate,no  post nasal drip, no LAN Cardiac: S1, S2, regular rate and rhythm, no murmur Chest: No wheeze/ rales/ dullness; no accessory muscle use, no nasal flaring, no sternal retractions, diminished per bases, prolonged expiratory phase Abd. BS +. Body mass index is 24.18 kg/m. Soft Non-tender, ND,  Ext: No clubbing cyanosis, edema, no obvious deformities Neuro:  normal strength, MAE x 4, A&O x 3, appropriate Skin: No rashes, warm and dry, no obvious lesions  Psych: normal mood and behavior  Physical Exam    Assessment/Plan Assessment & Plan COPD Emphysema Former smoker  Plan Please continue using your Trelegy 1 puff in the morning daily.   Rinse mouth after use Continue using your albuterol  as you have been doing for breakthrough shortness of breath or wheezing. Note your daily symptoms > remember red flags for COPD:  Increase in cough, increase in sputum production, increase in shortness of breath or activity intolerance. If you notice these symptoms, please call to be seen.       Lung Nodule Previously biopsied, negative for malignancy Continued surveillance Plan You are due for your follow-up low-dose screening CT December 2025. I have reached out to the lung cancer screening navigation staff and they will call you to get you scheduled. You will follow up with me 1-2 weeks after the scan.  Please call for any blood in your sputum or unexplained weight loss.   Chronic hypoxic respiratory Failure with hypoxemia Plan Continue wearing oxygen  to maintain your oxygen  saturation levels at > 88% at all times.  We have walked you today on an [employer], portable oxygen , and you do qualify for the [employer] use.  We have placed the order. You should get a call about getting the machine. Please wear your oxygen  with any activity. Call if you do not hear from them, so we can make sure you get your oxygen .   I spent 15 minutes dedicated  to the care of this patient on the date of this encounter to include pre-visit review of records, face-to-face time with the patient discussing conditions above, post visit ordering of testing, clinical documentation with the electronic health record, making appropriate referrals as documented, and communicating necessary information to the patient's healthcare team.    Lauraine JULIANNA Lites, NP 09/16/2024  11:23 AM

## 2024-09-18 NOTE — Telephone Encounter (Signed)
 Yes faxing over today was waiting on signed note,will fax today.NFN

## 2024-09-18 NOTE — Telephone Encounter (Signed)
 Copied from CRM #[id number]. Topic: General - Other >> Sep 14, 2024 12:46 PM Ismael A wrote: Reason for CRM: received call from Harlene at [facility] stating she faxed over an oxygen  test letter for patient's upcoming appointment on 11/19 >> Sep 18, 2024 10:46 AM Benton KIDD wrote: harlene from [employer] calling to send the nurse a reminder to send over documentation her oxygen  visit  Fax number [phone] Phone number [phone] direct line and secured voicemail as well   Routing to [facility] CMA.  Wilford, Did this get taken care of on 11/19?

## 2024-09-29 ENCOUNTER — Telehealth: Payer: Self-pay | Admitting: Acute Care

## 2024-09-29 NOTE — Telephone Encounter (Signed)
 Patient will not be needing [employer] Oxygen . He received oxygen  from his other provider.

## 2024-10-19 ENCOUNTER — Encounter: Payer: Self-pay | Admitting: Family Medicine

## 2024-10-19 ENCOUNTER — Ambulatory Visit (INDEPENDENT_AMBULATORY_CARE_PROVIDER_SITE_OTHER): Admitting: Family Medicine

## 2024-10-19 VITALS — BP 110/84 | HR 88 | Temp 97.9°F | Ht 71.0 in | Wt 176.0 lb

## 2024-10-19 DIAGNOSIS — Z79899 Other long term (current) drug therapy: Secondary | ICD-10-CM

## 2024-10-19 DIAGNOSIS — N529 Male erectile dysfunction, unspecified: Secondary | ICD-10-CM

## 2024-10-19 MED ORDER — SILDENAFIL CITRATE 50 MG PO TABS: 50.0000 mg | ORAL_TABLET | Freq: Every day | ORAL | 2 refills | Status: AC | PRN

## 2024-10-19 NOTE — Patient Instructions (Addendum)
-  It was a pleasure to see you today.  -Continue all your medications. -Continue care with pulmonary and have CT lung screening scan on 12/24.  -EKG completed for medication management of initiating restarting Viagra .  -Prescribed Viagra  50mg  tablet, may take 1 tablet daily as needed 30-60 minutes before sexual intercourse. Do not take medication along with Amlodipine , it will lower your blood pressure too much. -Follow up in 3 months for chronic management and please be fasting for labs.

## 2024-10-19 NOTE — Progress Notes (Unsigned)
" ° °  Established Patient Office Visit   Subjective:  Patient ID: Travis Lawrence, male    DOB: 09-19-1956  Age: 68 y.o. MRN: [id number]  Chief Complaint  Patient presents with   Medical Management of Chronic Issues    3 month follow up     HPI Patient reports he had an influenza vaccine last week. However, three days ago he started having a sore throat, productive cough (clear, thick), SHOB on exertion, sinus pressure, and nasal drainage/congestion. Denies chest pain, fever, headache, ear ache or drainage, nausea, vomiting, loss of appetite.    HTN: Chronic. Patient is prescribed Amlodipine  10mg  daily. He reports he monitors his blood pressure at home. He reports  it is good.  BP Readings from Last 3 Encounters:  10/19/24 110/84  09/16/24 114/73  07/22/24 108/74    Hyperlipidemia: Chronic. Patient is taking Rosuvastatin  10mg  daily.  Lab Results  Component Value Date   CHOL 169 07/22/2024   HDL 53.20 07/22/2024   LDLCALC 91 07/22/2024   TRIG 121.0 07/22/2024   CHOLHDL 3 07/22/2024    Patient is under the care of [facility] for lung nodule from abnormal CT of chest and pulmonary emphysema. He is a former smoker and uses oxygen  all the time. Patient is prescribed Albuterol  Inhaler,  Atrovent /Xopenex  nebulizer, and Trelegy inhaler daily.  He reports he is using the Albuterol  Inhaler about twice a day SHOB, even beyond him being currently sick; uses Atrovent  and Xopenex  nebulizers about once or twice a day. He is having a low dose CT lung cancer screening on 10/21/2024 for right upper lobe nodule that is under surveillance.   Patient is requesting Viagra . He reports he is having difficulties with obtaining and keeping an erection. He reports he was previously prescribed Viagra  and it helped.   ROS See HPI above     Objective:   BP 110/84   Pulse 88   Temp 97.9 F (36.6 C) (Oral)   Ht 5' 11 (1.803 m)   Wt 176 lb (79.8 kg)   SpO2 91%   BMI 24.55 kg/m  {Vitals History  (Optional):23777}  Physical Exam  No results found for any visits on 10/19/24.  The 10-year ASCVD risk score (Arnett DK, et al., 2019) is: 13.6%    Assessment & Plan:  There are no diagnoses linked to this encounter.  No follow-ups on file.   Laster Appling, NP "

## 2024-10-21 ENCOUNTER — Ambulatory Visit (HOSPITAL_BASED_OUTPATIENT_CLINIC_OR_DEPARTMENT_OTHER)
Admission: RE | Admit: 2024-10-21 | Discharge: 2024-10-21 | Disposition: A | Discharge status: Home / Self Care | Source: Ambulatory Visit | Attending: Acute Care | Admitting: Acute Care

## 2024-10-21 DIAGNOSIS — F17210 Nicotine dependence, cigarettes, uncomplicated: Secondary | ICD-10-CM | POA: Diagnosis present

## 2024-10-21 DIAGNOSIS — Z87891 Personal history of nicotine dependence: Secondary | ICD-10-CM | POA: Diagnosis present

## 2024-10-21 DIAGNOSIS — Z122 Encounter for screening for malignant neoplasm of respiratory organs: Secondary | ICD-10-CM | POA: Insufficient documentation

## 2024-10-25 ENCOUNTER — Other Ambulatory Visit: Payer: Self-pay | Admitting: Family Medicine

## 2024-10-25 DIAGNOSIS — I10 Essential (primary) hypertension: Secondary | ICD-10-CM

## 2024-11-02 ENCOUNTER — Telehealth: Payer: Self-pay | Admitting: *Deleted

## 2024-11-02 NOTE — Telephone Encounter (Signed)
 Call report from [facility]:  IMPRESSION: 1. Lung-RADS 4B, suspicious. Additional imaging evaluation or consultation with Pulmonology or Thoracic Surgery recommended. New 10.8 mm indistinct solid left lower lobe pulmonary nodule, indeterminate but potentially infectious/inflammatory. Follow up low-dose chest CT without contrast in 3 months (please use the following order, CT CHEST LCS NODULE FOLLOW-UP W/O CM) is recommended. 2. Three-vessel coronary atherosclerosis. 3. Aortic Atherosclerosis (ICD10-I70.0) and Emphysema (ICD10-J43.9).

## 2024-11-04 ENCOUNTER — Other Ambulatory Visit: Payer: Self-pay

## 2024-11-04 ENCOUNTER — Telehealth: Payer: Self-pay | Admitting: Acute Care

## 2024-11-04 DIAGNOSIS — Z122 Encounter for screening for malignant neoplasm of respiratory organs: Secondary | ICD-10-CM

## 2024-11-04 DIAGNOSIS — F17210 Nicotine dependence, cigarettes, uncomplicated: Secondary | ICD-10-CM

## 2024-11-04 DIAGNOSIS — Z87891 Personal history of nicotine dependence: Secondary | ICD-10-CM

## 2024-11-04 DIAGNOSIS — R911 Solitary pulmonary nodule: Secondary | ICD-10-CM

## 2024-11-04 NOTE — Telephone Encounter (Signed)
 LR 4 B. Previously seen nodules are decreased in size or stable. There is a new 10.8 mm LLL solid nodule  Please call patient and see if he has been sick.  If he has been sick, he should see PCP for treatment, and we will do an 8 week follow up scan.  If he has not been sick, we will plan a 3 month follow which will be due 12/2024.This was radiology recommendation and I agree with it.  Please fax PCP and let them know plan of care  Thanks so much

## 2024-11-04 NOTE — Telephone Encounter (Signed)
 Spoke with patient and reviewed recent Lung CT results. He is in agreement to complete a 8 week follow up scan. States he had a cold around the time of his previous scan and was treated with antibiotics. He has been scheduled for 12/22/2024 at [facility]. Order placed. Results and plan to PCP.

## 2024-11-04 NOTE — Telephone Encounter (Signed)
 See provider note 11/04/2024 for update

## 2024-11-19 ENCOUNTER — Telehealth: Payer: Self-pay | Admitting: *Deleted

## 2024-11-19 DIAGNOSIS — J449 Chronic obstructive pulmonary disease, unspecified: Secondary | ICD-10-CM

## 2024-11-19 DIAGNOSIS — J9611 Chronic respiratory failure with hypoxia: Secondary | ICD-10-CM

## 2024-11-19 NOTE — Telephone Encounter (Signed)
" ° °  Belvie Isaiah HERO, RN    11/04/24  2:51 PM Note Spoke with patient and reviewed recent Lung CT results. He is in agreement to complete a 8 week follow up scan. States he had a cold around the time of his previous scan and was treated with antibiotics. He has been scheduled for 12/22/2024 at [facility]. Order placed. Results and plan to PCP.       Copied from CRM #[id number]. Topic: Appointments - Scheduling Inquiry for Clinic >> Nov 17, 2024 11:23 AM Leila BROCKS wrote: Reason for CRM: Patient [phone] asked if there's an upcoming appointment with NP, Ruthell? Informed patient, last saw NP, Groce 09/16/24 and was advised to return in about 1 month (around 10/16/2024), or if symptoms worsen or fail to improve, for review CT.  Patient did the CT scan 12/25 and has another CT scan 12/23/24. Please call back to schedule patient with NP, Groce. Please advise and call back. "

## 2024-11-19 NOTE — Telephone Encounter (Signed)
 Returned call. Advised next appt for our office is 12/23/2024 at [facility] to complete a Lung CT, advised there is no current OV scheduled. Pt verbalized understanding.

## 2024-11-19 NOTE — Telephone Encounter (Signed)
 Pt requesting 10 Liter 02 concentrator He states he was told that he needed a 10 L.  Copied from CRM [id number]. Topic: Clinical - Order For Equipment >> Nov 17, 2024 11:18 AM Leila BROCKS wrote: Reason for CRM: Patient [phone] states uses [employer] for oxygen  concentrator and was given 5 liters instead of 10 liters of oxygen . Patient needs a new order oxygen  concentrator 10 liters from NP, Groce to [facility] [phone]. Please advise and call back when this is done.

## 2024-12-02 ENCOUNTER — Other Ambulatory Visit: Payer: Self-pay | Admitting: Family Medicine

## 2024-12-02 DIAGNOSIS — J069 Acute upper respiratory infection, unspecified: Secondary | ICD-10-CM

## 2024-12-02 DIAGNOSIS — E781 Pure hyperglyceridemia: Secondary | ICD-10-CM

## 2024-12-23 ENCOUNTER — Ambulatory Visit (HOSPITAL_BASED_OUTPATIENT_CLINIC_OR_DEPARTMENT_OTHER)
